# Patient Record
Sex: Female | Born: 1935 | Race: White | Hispanic: No | Marital: Single | Smoking: Former smoker
Health system: Southern US, Community
[De-identification: ages and names within clinical notes are randomized; demographics above are authoritative.]

## PROBLEM LIST (undated history)

## (undated) DIAGNOSIS — E079 Disorder of thyroid, unspecified: Secondary | ICD-10-CM

## (undated) DIAGNOSIS — I4891 Unspecified atrial fibrillation: Secondary | ICD-10-CM

## (undated) DIAGNOSIS — C50912 Malignant neoplasm of unspecified site of left female breast: Secondary | ICD-10-CM

## (undated) DIAGNOSIS — I10 Essential (primary) hypertension: Secondary | ICD-10-CM

## (undated) DIAGNOSIS — M359 Systemic involvement of connective tissue, unspecified: Secondary | ICD-10-CM

## (undated) DIAGNOSIS — C801 Malignant (primary) neoplasm, unspecified: Secondary | ICD-10-CM

## (undated) DIAGNOSIS — I341 Nonrheumatic mitral (valve) prolapse: Secondary | ICD-10-CM

## (undated) HISTORY — DX: Malignant (primary) neoplasm, unspecified: C80.1

## (undated) HISTORY — DX: Malignant neoplasm of unspecified site of left female breast: C50.912

## (undated) HISTORY — PX: OTHER SURGICAL HISTORY: SHX169

## (undated) HISTORY — DX: Disorder of thyroid, unspecified: E07.9

## (undated) HISTORY — PX: BREAST LUMPECTOMY: SHX2

## (undated) HISTORY — DX: Essential (primary) hypertension: I10

---

## 1999-01-17 ENCOUNTER — Encounter: Payer: Self-pay | Admitting: Plastic Surgery

## 1999-01-17 ENCOUNTER — Ambulatory Visit (HOSPITAL_COMMUNITY): Admission: RE | Admit: 1999-01-17 | Discharge: 1999-01-17 | Payer: Self-pay | Admitting: Plastic Surgery

## 1999-01-22 ENCOUNTER — Ambulatory Visit (HOSPITAL_COMMUNITY): Admission: RE | Admit: 1999-01-22 | Discharge: 1999-01-22 | Payer: Self-pay | Admitting: Plastic Surgery

## 1999-09-05 ENCOUNTER — Other Ambulatory Visit: Admission: RE | Admit: 1999-09-05 | Discharge: 1999-09-05 | Payer: Self-pay | Admitting: Obstetrics and Gynecology

## 2002-02-23 ENCOUNTER — Other Ambulatory Visit: Admission: RE | Admit: 2002-02-23 | Discharge: 2002-02-23 | Payer: Self-pay | Admitting: *Deleted

## 2002-07-27 ENCOUNTER — Encounter: Admission: RE | Admit: 2002-07-27 | Discharge: 2002-09-26 | Payer: Self-pay | Admitting: Surgery

## 2003-06-06 ENCOUNTER — Encounter: Admission: RE | Admit: 2003-06-06 | Discharge: 2003-07-04 | Payer: Self-pay | Admitting: Surgery

## 2003-07-07 ENCOUNTER — Encounter: Payer: Self-pay | Admitting: Internal Medicine

## 2003-07-07 ENCOUNTER — Encounter: Admission: RE | Admit: 2003-07-07 | Discharge: 2003-07-07 | Payer: Self-pay | Admitting: Internal Medicine

## 2006-01-19 ENCOUNTER — Encounter: Admission: RE | Admit: 2006-01-19 | Discharge: 2006-01-19 | Payer: Self-pay | Admitting: *Deleted

## 2006-01-21 ENCOUNTER — Encounter (INDEPENDENT_AMBULATORY_CARE_PROVIDER_SITE_OTHER): Payer: Self-pay | Admitting: *Deleted

## 2006-01-21 ENCOUNTER — Ambulatory Visit (HOSPITAL_BASED_OUTPATIENT_CLINIC_OR_DEPARTMENT_OTHER): Admission: RE | Admit: 2006-01-21 | Discharge: 2006-01-21 | Payer: Self-pay | Admitting: *Deleted

## 2006-01-26 ENCOUNTER — Ambulatory Visit: Payer: Self-pay | Admitting: Internal Medicine

## 2006-06-23 ENCOUNTER — Encounter: Admission: RE | Admit: 2006-06-23 | Discharge: 2006-08-18 | Payer: Self-pay | Admitting: Surgery

## 2007-10-13 ENCOUNTER — Telehealth (INDEPENDENT_AMBULATORY_CARE_PROVIDER_SITE_OTHER): Payer: Self-pay | Admitting: *Deleted

## 2008-01-06 ENCOUNTER — Telehealth (INDEPENDENT_AMBULATORY_CARE_PROVIDER_SITE_OTHER): Payer: Self-pay | Admitting: *Deleted

## 2008-03-02 ENCOUNTER — Encounter: Payer: Self-pay | Admitting: Internal Medicine

## 2008-03-06 ENCOUNTER — Telehealth (INDEPENDENT_AMBULATORY_CARE_PROVIDER_SITE_OTHER): Payer: Self-pay | Admitting: *Deleted

## 2008-03-07 ENCOUNTER — Encounter: Payer: Self-pay | Admitting: Internal Medicine

## 2008-03-07 ENCOUNTER — Encounter (INDEPENDENT_AMBULATORY_CARE_PROVIDER_SITE_OTHER): Payer: Self-pay | Admitting: *Deleted

## 2008-03-09 ENCOUNTER — Ambulatory Visit: Payer: Self-pay | Admitting: Internal Medicine

## 2008-03-09 DIAGNOSIS — E559 Vitamin D deficiency, unspecified: Secondary | ICD-10-CM

## 2008-03-09 DIAGNOSIS — M810 Age-related osteoporosis without current pathological fracture: Secondary | ICD-10-CM | POA: Insufficient documentation

## 2008-03-09 DIAGNOSIS — L255 Unspecified contact dermatitis due to plants, except food: Secondary | ICD-10-CM | POA: Insufficient documentation

## 2008-03-21 ENCOUNTER — Encounter (INDEPENDENT_AMBULATORY_CARE_PROVIDER_SITE_OTHER): Payer: Self-pay | Admitting: *Deleted

## 2008-04-21 ENCOUNTER — Encounter (INDEPENDENT_AMBULATORY_CARE_PROVIDER_SITE_OTHER): Payer: Self-pay | Admitting: *Deleted

## 2008-05-23 ENCOUNTER — Encounter: Admission: RE | Admit: 2008-05-23 | Discharge: 2008-08-11 | Payer: Self-pay | Admitting: Family Medicine

## 2008-07-07 ENCOUNTER — Ambulatory Visit: Payer: Self-pay | Admitting: Sports Medicine

## 2008-07-07 DIAGNOSIS — M67919 Unspecified disorder of synovium and tendon, unspecified shoulder: Secondary | ICD-10-CM | POA: Insufficient documentation

## 2008-07-07 DIAGNOSIS — M719 Bursopathy, unspecified: Secondary | ICD-10-CM

## 2008-07-07 DIAGNOSIS — M25519 Pain in unspecified shoulder: Secondary | ICD-10-CM

## 2008-07-10 ENCOUNTER — Encounter: Payer: Self-pay | Admitting: Sports Medicine

## 2008-10-05 ENCOUNTER — Ambulatory Visit: Payer: Self-pay | Admitting: Sports Medicine

## 2008-12-21 ENCOUNTER — Ambulatory Visit: Payer: Self-pay | Admitting: Sports Medicine

## 2008-12-21 DIAGNOSIS — M19049 Primary osteoarthritis, unspecified hand: Secondary | ICD-10-CM | POA: Insufficient documentation

## 2008-12-28 ENCOUNTER — Encounter: Admission: RE | Admit: 2008-12-28 | Discharge: 2009-01-18 | Payer: Self-pay | Admitting: Sports Medicine

## 2009-01-04 ENCOUNTER — Ambulatory Visit: Payer: Self-pay | Admitting: Sports Medicine

## 2009-01-04 DIAGNOSIS — M1710 Unilateral primary osteoarthritis, unspecified knee: Secondary | ICD-10-CM

## 2009-01-17 ENCOUNTER — Encounter: Payer: Self-pay | Admitting: Sports Medicine

## 2009-01-18 ENCOUNTER — Encounter: Payer: Self-pay | Admitting: Sports Medicine

## 2009-01-24 ENCOUNTER — Encounter: Payer: Self-pay | Admitting: Internal Medicine

## 2009-02-12 ENCOUNTER — Ambulatory Visit: Payer: Self-pay | Admitting: Sports Medicine

## 2009-02-12 DIAGNOSIS — G5750 Tarsal tunnel syndrome, unspecified lower limb: Secondary | ICD-10-CM | POA: Insufficient documentation

## 2009-03-14 ENCOUNTER — Encounter: Payer: Self-pay | Admitting: Internal Medicine

## 2009-03-16 ENCOUNTER — Ambulatory Visit: Payer: Self-pay | Admitting: Internal Medicine

## 2009-03-16 ENCOUNTER — Telehealth: Payer: Self-pay | Admitting: Family Medicine

## 2009-03-16 ENCOUNTER — Emergency Department (HOSPITAL_COMMUNITY): Admission: EM | Admit: 2009-03-16 | Discharge: 2009-03-16 | Payer: Self-pay | Admitting: Emergency Medicine

## 2009-03-17 ENCOUNTER — Observation Stay (HOSPITAL_COMMUNITY): Admission: EM | Admit: 2009-03-17 | Discharge: 2009-03-17 | Payer: Self-pay | Admitting: Emergency Medicine

## 2009-03-23 ENCOUNTER — Encounter: Payer: Self-pay | Admitting: Internal Medicine

## 2009-03-26 ENCOUNTER — Telehealth: Payer: Self-pay | Admitting: Family Medicine

## 2009-03-28 ENCOUNTER — Ambulatory Visit: Payer: Self-pay | Admitting: Internal Medicine

## 2009-03-28 LAB — CONVERTED CEMR LAB: Vit D, 25-Hydroxy: 36 ng/mL (ref 30–89)

## 2009-03-30 ENCOUNTER — Ambulatory Visit: Payer: Self-pay | Admitting: Internal Medicine

## 2009-03-30 DIAGNOSIS — S022XXA Fracture of nasal bones, initial encounter for closed fracture: Secondary | ICD-10-CM

## 2009-03-30 DIAGNOSIS — G450 Vertebro-basilar artery syndrome: Secondary | ICD-10-CM | POA: Insufficient documentation

## 2009-04-02 ENCOUNTER — Telehealth: Payer: Self-pay | Admitting: Internal Medicine

## 2009-04-04 ENCOUNTER — Ambulatory Visit: Payer: Self-pay | Admitting: Internal Medicine

## 2009-04-07 LAB — CONVERTED CEMR LAB: TSH: 0.3 microintl units/mL — ABNORMAL LOW (ref 0.35–5.50)

## 2009-04-10 ENCOUNTER — Encounter: Payer: Self-pay | Admitting: Internal Medicine

## 2009-04-11 ENCOUNTER — Encounter (INDEPENDENT_AMBULATORY_CARE_PROVIDER_SITE_OTHER): Payer: Self-pay | Admitting: *Deleted

## 2009-04-11 ENCOUNTER — Telehealth (INDEPENDENT_AMBULATORY_CARE_PROVIDER_SITE_OTHER): Payer: Self-pay | Admitting: *Deleted

## 2009-04-19 ENCOUNTER — Ambulatory Visit: Payer: Self-pay | Admitting: Sports Medicine

## 2009-05-29 ENCOUNTER — Ambulatory Visit: Payer: Self-pay | Admitting: Internal Medicine

## 2009-05-29 DIAGNOSIS — K5900 Constipation, unspecified: Secondary | ICD-10-CM | POA: Insufficient documentation

## 2009-06-05 ENCOUNTER — Encounter: Admission: RE | Admit: 2009-06-05 | Discharge: 2009-06-05 | Payer: Self-pay | Admitting: Gastroenterology

## 2009-06-05 ENCOUNTER — Ambulatory Visit: Payer: Self-pay | Admitting: Sports Medicine

## 2009-06-05 DIAGNOSIS — T07XXXA Unspecified multiple injuries, initial encounter: Secondary | ICD-10-CM

## 2009-06-05 DIAGNOSIS — M461 Sacroiliitis, not elsewhere classified: Secondary | ICD-10-CM

## 2009-07-09 ENCOUNTER — Ambulatory Visit: Payer: Self-pay | Admitting: Sports Medicine

## 2009-07-17 ENCOUNTER — Encounter: Admission: RE | Admit: 2009-07-17 | Discharge: 2009-09-12 | Payer: Self-pay | Admitting: Sports Medicine

## 2009-07-17 ENCOUNTER — Encounter: Payer: Self-pay | Admitting: Sports Medicine

## 2009-08-22 ENCOUNTER — Encounter: Payer: Self-pay | Admitting: Sports Medicine

## 2009-09-19 ENCOUNTER — Ambulatory Visit: Payer: Self-pay | Admitting: Sports Medicine

## 2009-09-26 ENCOUNTER — Encounter: Admission: RE | Admit: 2009-09-26 | Discharge: 2009-09-26 | Payer: Self-pay | Admitting: Gastroenterology

## 2009-11-21 ENCOUNTER — Encounter: Payer: Self-pay | Admitting: Sports Medicine

## 2009-12-20 ENCOUNTER — Ambulatory Visit: Payer: Self-pay | Admitting: Sports Medicine

## 2009-12-20 DIAGNOSIS — M479 Spondylosis, unspecified: Secondary | ICD-10-CM | POA: Insufficient documentation

## 2010-01-31 ENCOUNTER — Ambulatory Visit: Payer: Self-pay | Admitting: Sports Medicine

## 2010-04-09 ENCOUNTER — Ambulatory Visit: Payer: Self-pay | Admitting: Sports Medicine

## 2010-07-04 ENCOUNTER — Ambulatory Visit: Payer: Self-pay | Admitting: Sports Medicine

## 2010-10-24 NOTE — Consult Note (Signed)
Summary: Karie Fetch PT  [facility]   Imported By: Marily Memos 12/20/2009 08:37:28  _____________________________________________________________________  External Attachment:    Type:   Image     Comment:   External Document

## 2010-10-24 NOTE — Assessment & Plan Note (Signed)
Summary: F/U,MC   Vital Signs:  Patient profile:   77 year old female Height:      65 inches Weight:      137 pounds BMI:     22.88 BP sitting:   138 / 81  Vitals Entered By: Lillia Pauls CMA (Jan 31, 2010 11:12 AM)  Primary Provider:  Marga Melnick MD   History of Present Illness: [name] reports to f/u osteoporosis and bilateral knee DJD. Overall she is doing well.  She has returned to silver feet exercise program; a few 50-min sessions weekly; various exercises. Able to perform exercises without difficulty. Occasionally experiences increased B knee and quad pain after her sessions. She performs daily back rehab exercises as previously instructed.  Hx of chronic distal LE non-pitting edema. Recently began taking lisinopril-hctz for HTN. BP more controlled. Taking a [employer] creatine supplement without change in chronic lower extremity swelling. Gradually gaining weight as desired.     Allergies: No Known Drug Allergies  Physical Exam  General:  Well-developed,well-nourished,in no acute distress; alert,appropriate and cooperative throughout examination Msk:  Unchanged from LOV except for notably decreased difficulty arising from chair w/o use of arms.   No ttp of quads or knees.  Slight (0-5 deg) extension deficit wrt knees.   No knee swelling.   Slight diffuse non-pitting edema of the feet.  Diffuse varicosities throughout distal LEs.   Impression & Recommendations:  Problem # 1:  OSTEOPOROSIS (ICD-733.00)  - Continue current exercise regimen, particularly back stretches and wall walks as tolerated. - Continue creatine supplementation given no signs of adverse effects. - RTC as needed.  Her updated medication list for this problem includes:    Actonel 35 Mg Tabs (Risedronate sodium) .Marland Kitchen... Take one tablet weekly    Maximum D3 10000 Unit Caps (Cholecalciferol) .Marland Kitchen... 1 pill m, w & f  Problem # 2:  OSTEOARTHRITIS, SPINE, NOS (ICD-721.90)  - Continue current  daily exercises. - Add back exercises per handout provided to the patient. - RTC as needed.  Problem # 3:  OSTEOARTHRITIS, KNEES, BILATERAL (ICD-715.96)  - Continue current pair of custom orthotics. - Provided additional heel wedges for use in other pairs or shoes.   Her updated medication list for this problem includes:    Tizanidine Hcl 4 Mg Tabs (Tizanidine hcl) .Marland Kitchen... 1 by mouth at bedtime or three times a day as needed  Complete Medication List: 1)  Actonel 35 Mg Tabs (Risedronate sodium) .... Take one tablet weekly 2)  Armour Thyroid 120 Mg  .Marland Kitchen.. 1 once daily except 1/2 on weds 3)  Isocort  .Marland Kitchen.. 1 qd 4)  Vit D 3 1000u  .Marland Kitchen.. 1 qd 5)  Fish Oil Omega 3  .... 2 qd 6)  Serene  .Marland Kitchen.. 2 at bedtime 7)  Vitamin B-12 Cr 2000 Mcg Cr-tabs (Cyanocobalamin) .Marland Kitchen.. 1 by mouth once daily 8)  Alpha-lopid Acid 300mg   .... 1 by mouth once daily 9)  Chooz  .... Gum for indigestion 10)  Maximum D3 10000 Unit Caps (Cholecalciferol) .Marland Kitchen.. 1 pill m, w & f 11)  Alprazolam 0.5 Mg Tabs (Alprazolam) .Marland Kitchen.. 1 by mouth every 12 hours as needed 12)  Tizanidine Hcl 4 Mg Tabs (Tizanidine hcl) .Marland Kitchen.. 1 by mouth at bedtime or three times a day as needed 13)  Lisinopril-hydrochlorothiazide 20-12.5 Mg Tabs (Lisinopril-hydrochlorothiazide) .... Take one daily

## 2010-10-24 NOTE — Assessment & Plan Note (Signed)
Summary: FU VISIT/MJD   Vital Signs:  Patient profile:   77 year old female BP sitting:   159 / 79  Vitals Entered By: Lillia Pauls CMA (July 04, 2010 1:42 PM)  Primary Provider:  Marga Melnick MD   History of Present Illness: [name] is an 77 yo F with a history of osteoarthritis, osteoporosis and a rotator cuff tear. She comes in today for follow up.  Her main concern is biceps and shoulder pain on R. She feels it mostly when sleeping on L side with arm hanging accross her front. It is relieved by switching sides. She has been cautious in increasing her exercise on that arm since the rotator cuff tear. She is up to 3 lb weights for curls.  Knees still painful, especially in the morning and after activities. They improve throughout the day, unless she has done something strenuous. She is doing silver sneakers 3 times a week and tai chi but has stopped with the stationary bike and treadmill.  Back greatly improved with physical therapy. Current pain very manageable.  Allergies: No Known Drug Allergies  Physical Exam  General:  Well-developed,well-nourished,in no acute distress; alert,appropriate and cooperative throughout examination Msk:  Back: Mild kyphosis, but improved from last visit. Prominence of vertebrae in thoracic spine. Improved posture.  Arm/shoulder: R shoulder full ROM, equal to L. Some pain with crossing arm over front. No pain with palpation of AC joint, biceps tendon. Good strength with flexion, internal rotation, external rotation. Negative Neer's and Hawkins. Some pain with Speeds. Yerguson's without pain.  Knee:On standing, genuvalgus evident bilaterally. No pain to palpation along superior patella, joint lines, inferior patella. Good strength with flexion/extension at knee. 4/5 strength with hip flexion bilaterally.   Impression & Recommendations:  Problem # 1:  OSTEOARTHRITIS, KNEES, BILATERAL (ICD-715.96) Arthritis of knees with pain. She will  continue her exercise activities. Also advised to begin doing standing exercises for hip flexion to increase strength and maintain ability to rise from chair. Gave new sports insoles with tapered heel lifts and posting along medial edge. Her updated medication list for this problem includes:    Tizanidine Hcl 4 Mg Tabs (Tizanidine hcl) .Marland Kitchen... 1 by mouth at bedtime or three times a day as needed  Problem # 2:  OSTEOARTHRITIS, SPINE, NOS (ICD-721.90) Continue back therapy. Still has prominence of thoracic vertebra. Therapist advised on exercises to stretch and maintain posture. Advised on modified wall sit with pillow between shoulder blades, or lean with pillow at shoulder blades to increase postural strength.  Problem # 3:  SHOULDER PAIN, RIGHT (ICD-719.41) Pain likely secondary to stretching of rotator cuff in sleeping position. Patient should continue shoulder exercises and sleep with pillow between elbow and side to decrease pressure on shoulder.  Return in 3-4 months for f/u or as needed. Her updated medication list for this problem includes:    Tizanidine Hcl 4 Mg Tabs (Tizanidine hcl) .Marland Kitchen... 1 by mouth at bedtime or three times a day as needed  Complete Medication List: 1)  Actonel 35 Mg Tabs (Risedronate sodium) .... Take one tablet weekly 2)  Armour Thyroid 120 Mg  .Marland Kitchen.. 1 once daily except 1/2 on weds 3)  Isocort  .Marland Kitchen.. 1 qd 4)  Vit D 3 1000u  .Marland Kitchen.. 1 qd 5)  Fish Oil Omega 3  .... 2 qd 6)  Serene  .Marland Kitchen.. 2 at bedtime 7)  Vitamin B-12 Cr 2000 Mcg Cr-tabs (Cyanocobalamin) .Marland Kitchen.. 1 by mouth once daily 8)  Alpha-lopid Acid 300mg   .Marland KitchenMarland KitchenMarland Kitchen  1 by mouth once daily 9)  Chooz  .... Gum for indigestion 10)  Maximum D3 10000 Unit Caps (Cholecalciferol) .Marland Kitchen.. 1 pill m, w & f 11)  Alprazolam 0.5 Mg Tabs (Alprazolam) .Marland Kitchen.. 1 by mouth every 12 hours as needed 12)  Tizanidine Hcl 4 Mg Tabs (Tizanidine hcl) .Marland Kitchen.. 1 by mouth at bedtime or three times a day as needed 13)  Lisinopril-hydrochlorothiazide 20-12.5 Mg  Tabs (Lisinopril-hydrochlorothiazide) .... Take one daily  Appended Document: Orders Update    Clinical Lists Changes  Orders: Added new Service order of Est. Patient Level III (16109) - Signed

## 2010-10-24 NOTE — Assessment & Plan Note (Signed)
Summary: F/U,MC   Vital Signs:  Patient profile:   77 year old female Pulse rate:   80 / minute BP sitting:   147 / 76  (right arm)  Vitals Entered By: Terese Door (April 09, 2010 11:50 AM) CC: F/U Osteoporosis and Bilateral knee DJD   Primary Provider:  Marga Melnick MD  CC:  F/U Osteoporosis and Bilateral knee DJD.  History of Present Illness: 77 yo female here for f/u of back pain, knee pain, and right arm pain. 1.  Back pain: compression fx at T12, L1, chronic.  Hx of fall about 1 year ago.  Complains of pain in the middle of her back, especially when she lays on her back or leans on a hard surface.  No incontinence.  Still going to silver sneakers a few times a week.  Does not feel her function is back to baseline since fall.  2. Bilateral knee pain: chronic. 3. Right arm pain: hx of rotator cuff tear and biceps tendinitis (?).    she has clearly gained srength and feels better on Creatine  she has concerns about staying on bisphosphonates for her osteoporotic level BMD  Allergies: No Known Drug Allergies  Physical Exam  General:  Well-developed,well-nourished,in no acute distress; alert,appropriate and cooperative throughout examination Msk:  Back is improved w less kyphosis improved strength and posture  for rotator cuff she has essentially full elevation now and very little diff RT and LT  knees show bilat genu valgus Additional Exam:  Rewviewed 5 years of BMD data  lumbar spine has improved to < 2 on TX Hip total and fem neck - essentially no change  forearm somewhat worse T score   Impression & Recommendations:  Problem # 1:  OSTEOARTHRITIS, SPINE, NOS (ICD-721.90) new heel wedges in shoes for knees keep up exercise program but go every other day  Problem # 2:  OSTEOPOROSIS (ICD-733.00)  Her updated medication list for this problem includes:    Actonel 35 Mg Tabs (Risedronate sodium) .Marland Kitchen... Take one tablet weekly    Maximum D3 10000 Unit Caps  (Cholecalciferol) .Marland Kitchen... 1 pill m, w & f  This is stable and she had no fxs with bad falls last year not sure alendronate helps p multiple years and she could consider stopping  given diet and supplement advice to up calcium and vit D  Problem # 3:  ROTATOR CUFF SYNDROME, RIGHT (ICD-726.10) much improved needs to keeplt strength work  reck 3 mos  Complete Medication List: 1)  Actonel 35 Mg Tabs (Risedronate sodium) .... Take one tablet weekly 2)  Armour Thyroid 120 Mg  .Marland Kitchen.. 1 once daily except 1/2 on weds 3)  Isocort  .Marland Kitchen.. 1 qd 4)  Vit D 3 1000u  .Marland Kitchen.. 1 qd 5)  Fish Oil Omega 3  .... 2 qd 6)  Serene  .Marland Kitchen.. 2 at bedtime 7)  Vitamin B-12 Cr 2000 Mcg Cr-tabs (Cyanocobalamin) .Marland Kitchen.. 1 by mouth once daily 8)  Alpha-lopid Acid 300mg   .... 1 by mouth once daily 9)  Chooz  .... Gum for indigestion 10)  Maximum D3 10000 Unit Caps (Cholecalciferol) .Marland Kitchen.. 1 pill m, w & f 11)  Alprazolam 0.5 Mg Tabs (Alprazolam) .Marland Kitchen.. 1 by mouth every 12 hours as needed 12)  Tizanidine Hcl 4 Mg Tabs (Tizanidine hcl) .Marland Kitchen.. 1 by mouth at bedtime or three times a day as needed 13)  Lisinopril-hydrochlorothiazide 20-12.5 Mg Tabs (Lisinopril-hydrochlorothiazide) .... Take one daily

## 2010-10-24 NOTE — Assessment & Plan Note (Signed)
Summary: f/u,mc   Vital Signs:  Patient profile:   77 year old female BP sitting:   137 / 86  Vitals Entered By: Lillia Pauls CMA (December 20, 2009 11:10 AM)  Primary Provider:  Marga Melnick MD   History of Present Illness: Tried PT with [facility] and was not fully relieved w LBP  went to [facility] for [employer] tech this has helped her regain back motion and also improved her pain  lost 20 lbs from June to Sept during injuries and has not gained this back  Has had 2 GI evaluations but really did not improve x Tx for constipation Now using power pudding f joe graeddon and this is helping and relieving pain and lessening constipation Has a lot of pain w past colonoscopy and so has not wanted to do this again hx of LLQ painAbd CT showed Colitis  Would like to regain weight  wants to work on strength - now working with 1 lb and up to 3 lbs on few exercises  Allergies: No Known Drug Allergies  Physical Exam  General:  Well-developed,well-nourished,in no acute distress; alert,appropriate and cooperative throughout examination Msk:  Increased level of kyphosis in lumbar and thoracic spine  able to elevate arms above head to full ext with wall walk  able to do extension and this relieves back  still weak on hip flex and getting out of chair  better arm strength  better balance   Impression & Recommendations:  Problem # 1:  OSTEOPOROSIS (ICD-733.00)  Her updated medication list for this problem includes:    Actonel 35 Mg Tabs (Risedronate sodium) .Marland Kitchen... Take one tablet weekly    Maximum D3 10000 Unit Caps (Cholecalciferol) .Marland Kitchen... 1 pill m, w & f  I think this is worsening some with more compression and kyphosis in spine will gradually reintroduce more strength work  will let her try some creatine to see if she gets a strength boost as diet deficient in red meats  Problem # 2:  OSTEOARTHRITIS, SPINE, NOS (ICD-721.90) prob elements of DJD also changes of  compression fxs contribute  this makes it diffcult to sit because of prominence of cert vert bodies in back  wiull need to cushion this  Problem # 3:  CONSTIPATION (ICD-564.00) This is improving with food combo to improve stools  cont at least 8 glasses of water  reck in 8 wks  Complete Medication List: 1)  Actonel 35 Mg Tabs (Risedronate sodium) .... Take one tablet weekly 2)  Armour Thyroid 120 Mg  .Marland Kitchen.. 1 once daily except 1/2 on weds 3)  Isocort  .Marland Kitchen.. 1 qd 4)  Vit D 3 1000u  .Marland Kitchen.. 1 qd 5)  Fish Oil Omega 3  .... 2 qd 6)  Serene  .Marland Kitchen.. 2 at bedtime 7)  Vitamin B-12 Cr 2000 Mcg Cr-tabs (Cyanocobalamin) .Marland Kitchen.. 1 by mouth once daily 8)  Alpha-lopid Acid 300mg   .... 1 by mouth once daily 9)  Chooz  .... Gum for indigestion 10)  Maximum D3 10000 Unit Caps (Cholecalciferol) .Marland Kitchen.. 1 pill m, w & f 11)  Alprazolam 0.5 Mg Tabs (Alprazolam) .Marland Kitchen.. 1 by mouth every 12 hours as needed 12)  Tizanidine Hcl 4 Mg Tabs (Tizanidine hcl) .Marland Kitchen.. 1 by mouth at bedtime or three times a day as needed 13)  Lisinopril-hydrochlorothiazide 20-12.5 Mg Tabs (Lisinopril-hydrochlorothiazide) .... Take one daily  Patient Instructions: 1)  Mckenzie exxtension exercises keep up for Low back 2)  upward wall stretches 3)  work external rotation of shoulders with very light weigth 4)  add a couple others that you find good 5)  Creatine 5 grams daily with lots of water 6)  recheck in 6 weeks

## 2010-11-05 ENCOUNTER — Ambulatory Visit: Payer: Self-pay | Admitting: Sports Medicine

## 2010-11-12 ENCOUNTER — Ambulatory Visit (INDEPENDENT_AMBULATORY_CARE_PROVIDER_SITE_OTHER): Payer: Medicare Other | Admitting: Sports Medicine

## 2010-11-12 ENCOUNTER — Encounter: Payer: Self-pay | Admitting: Sports Medicine

## 2010-11-12 DIAGNOSIS — M719 Bursopathy, unspecified: Secondary | ICD-10-CM

## 2010-11-12 DIAGNOSIS — M1710 Unilateral primary osteoarthritis, unspecified knee: Secondary | ICD-10-CM

## 2010-11-12 DIAGNOSIS — M479 Spondylosis, unspecified: Secondary | ICD-10-CM

## 2010-11-19 NOTE — Assessment & Plan Note (Signed)
Summary: F/U,MC   Vital Signs:  Patient profile:   77 year old female Pulse rate:   97 / minute BP sitting:   164 / 91  (right arm)  Vitals Entered By: Jeannie Done CC: f/u knees and back   Primary Provider:  Marga Melnick MD  CC:  f/u knees and back.  History of Present Illness: Pt here to f/u:  Knee pain - 20% improved feels she has gained strength w biking onlu up to 15 mins day  LBP the specific stretches and exercise per PT have helped continues walking program w PT using cushion to sit  RT shoulder feels that she gained a lot of strength except w full overhead activity on RT some pain on biceps flexion if too heavy does exercises w 3 lbs OK but 5 lbs hurt  Sit and stand Doing silver sneakers has done really well w this now able to do standing knee to shoulder lifts and easily get from chair  Preventive Screening-Counseling & Management  Alcohol-Tobacco     Smoking Status: quit  Allergies: No Known Drug Allergies  Social History: Smoking Status:  quit  Physical Exam  General:  Well-developed,well-nourished,in no acute distress; alert,appropriate and cooperative throughout examination Msk:  RT shoulder pain and mils weakness on Speeds yergason's (supination) is stronger good IR and ER strength good abduction strength up to 90 deg still weak on empty can and above 120 deg of elevation on RT lack 10 deg full elevation  Back is stable to movement w better posture still prominent spinous process at T 10 level post  Knees show mild chronic DJD changes no effusion good strength for quads lacs 5 deg full extensino on l;eft and 3 deg on RT   Impression & Recommendations:  Problem # 1:  SHOULDER PAIN, RIGHT (ICD-719.41)  The following medications were removed from the medication list:    Tizanidine Hcl 4 Mg Tabs (Tizanidine hcl) .Marland Kitchen... 1 by mouth at bedtime or three times a day as needed  she has done very well w this cont to work rehab  protocol  cont to try to do wall climbs to gain last 10 deg of elevation  Problem # 2:  OSTEOARTHRITIS, SPINE, NOS (ICD-721.90) stable  no change in stretches  Problem # 3:  OSTEOARTHRITIS, KNEES, BILATERAL (ICD-715.96)  The following medications were removed from the medication list:    Tizanidine Hcl 4 Mg Tabs (Tizanidine hcl) .Marland Kitchen... 1 by mouth at bedtime or three times a day as needed  quad strength program working slowly cont to build up  reck this in 4 mos or so  Problem # 4:  Preventive Health Care (ICD-V70.0) overall active and doing well  Dr Asencion Islam sees her for a number of alternative treatments cont this and nutrition support  I would encourage cont exercise program and silver sneakers  Complete Medication List: 1)  Armour Thyroid 120 Mg  .Marland Kitchen.. 1 once daily except 1/2 on weds 2)  Isocort  .Marland Kitchen.. 1 qd 3)  Vit D 3 1000u  .Marland Kitchen.. 1 qd 4)  Fish Oil Omega 3  .... 2 qd 5)  Serene  .Marland Kitchen.. 2 at bedtime 6)  Vitamin B-12 Cr 2000 Mcg Cr-tabs (Cyanocobalamin) .Marland Kitchen.. 1 by mouth once daily 7)  Alpha-lopid Acid 300mg   .... 1 by mouth once daily 8)  Chooz  .... Gum for indigestion 9)  Maximum D3 10000 Unit Caps (Cholecalciferol) .Marland Kitchen.. 1 pill m, w & f 10)  Alprazolam 0.5 Mg Tabs (Alprazolam) .Marland KitchenMarland KitchenMarland Kitchen  1 by mouth every 12 hours as needed 11)  Lisinopril-hydrochlorothiazide 20-12.5 Mg Tabs (Lisinopril-hydrochlorothiazide) .... Take one daily   Orders Added: 1)  Est. Patient Level IV [40981]

## 2010-12-30 LAB — COMPREHENSIVE METABOLIC PANEL
ALT: 16 U/L (ref 0–35)
AST: 18 U/L (ref 0–37)
Albumin: 3.5 g/dL (ref 3.5–5.2)
Alkaline Phosphatase: 43 U/L (ref 39–117)
BUN: 9 mg/dL (ref 6–23)
Chloride: 104 mEq/L (ref 96–112)
GFR calc Af Amer: >60 mL/min (ref >60)
Potassium: 3.7 mEq/L (ref 3.5–5.1)
Sodium: 138 mEq/L (ref 135–145)
Total Bilirubin: 0.8 mg/dL (ref 0.3–1.2)
Total Protein: 6.7 g/dL (ref 6.0–8.3)

## 2010-12-30 LAB — CBC
HCT: 37.3 % (ref 36.0–46.0)
Platelets: 163 10*3/uL (ref 150–400)
RDW: 13.1 % (ref 11.5–15.5)
WBC: 5.9 10*3/uL (ref 4.0–10.5)

## 2010-12-30 LAB — PROTIME-INR: INR: 1.1 (ref 0.00–1.49)

## 2011-02-04 NOTE — Discharge Summary (Signed)
[name], [name] NO.:  [id number]   MEDICAL RECORD NO.:  [id number]          PATIENT TYPE:  OBV   LOCATION:  5120                         FACILITY:  [facility]   PHYSICIAN:  Valerie A. Felicity Coyer, MDDATE OF BIRTH:  1927-08-07   DATE OF ADMISSION:  03/16/2009  DATE OF DISCHARGE:  03/17/2009                               DISCHARGE SUMMARY   DISCHARGE DIAGNOSES:  1. Severe epistaxis, status post facial trauma, status post anterior      packing 6:25 p.m. to remain in for minimum 72 hours with outpatient      followup with Dr. Jenne Pane, ENT, see details below.  2. Facial fracture secondary to trauma including right nasal maxilla      and anterior plus lateral walls of right maxillary sinus.  Continue      pain control with Vicodin or Ultram as needed.  3. Osteoporosis history.  4. Osteoarthritis.  5. Hypothyroidism.  6. History of breast cancer, remote.   DISCHARGE MEDICATIONS:  Vicodin 5/500 one to two p.o. q.6 h. p.r.n.  moderate-to-severe pain.   Other medications are as prior to admission and include;  1. Augmentin 875 p.o. b.i.d. to continue for 7 days begun yesterday.  2. Ultram 50 mg p.o. q.4 h. p.r.n. moderate pain.  3. Zofran 4 mg p.o. b.i.d. p.r.n. nausea.  4. Vitamin D 2000 units daily.  5. Fish oil 1000 mg once daily.  6. Armour Thyroid 120 mg p.o. daily.  7. Actonel once weekly 70 mg.   DISPOSITION:  The patient is discharged home in medically stable and  improved condition.  Hospital follow up will be at [facility] ENT with  Dr. Jenne Pane.  The patient to call Monday June 28 for appointment  clarification.  The patient has been instructed that packing should  remain in place until follow up with ENT minimum of 72 hours and not to  remove prior to that time per instruction from Dr. Jenne Pane.   CONDITION ON DISCHARGE:  Medically improved and stable.  No further  bleeding since second ER evaluation on June 25.   HOSPITAL COURSE:  Severe epistaxis with facial  fracture due to trauma.  The patient is an 77 year old woman admitted for observation from the  emergency room due to continued nasal bleeding.  She had been seen in  the [facility] Emergency Room prior on the day of June 25 following a  fall into a brick wall with subsequent severe epistaxis bleeding and  evidence of right-sided facial fractures due to the trauma.  It seemed  the bleeding improved and she was initially discharged home with  prescription for Augmentin, Ultram p.r.n., and Zofran p.r.n.  However,  once at home with any upright position, the patient would have recurrent  severe bleeding, which prompted return to the emergency room for  packing.  A nasal anterior packing was placed into the right naris with  subsequent control of bleeding, which she was admitted for observation  due to same.  There has been no further evidence of bleeding, but there  was question on the patient's behalf as to timing  for packing removal.  I have discussed and clarified this with Dr. Jenne Pane, ENT on-call, who is  aware of the patient from previous discussion in the emergency room.  He  recommends packing remain in place for a minimum of 72 hours and not to  be removed prior to that time.  This information has been relayed to the  patient.  We will contact [facility] ENT on Monday June 28 for further  clarification of followup with Dr. Jenne Pane.  Her pain has been controlled  with addition of Vicodin during this hospitalization and she will be  discharged with a prescription of 20 tablets of same for treatment of  her pain in addition to the Ultram p.r.n.  All other medications and  medical problems are as prior to admission without change.      Valerie A. Felicity Coyer, MD  Electronically Signed     VAL/MEDQ  D:  03/17/2009  T:  03/17/2009  Job:  [id number]
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
## 2011-02-07 NOTE — Op Note (Signed)
NAMETILIA, FASO NO.:  [id number]   MEDICAL RECORD NO.:  [id number]          PATIENT TYPE:  AMB   LOCATION:  NESC                         [facility]ACILITY[facility]   PHYSICIAN:  Alfonse Ras, MD   DATE OF BIRTH:  08-09-1927   DATE OF PROCEDURE:  01/21/2006  DATE OF DISCHARGE:                                 OPERATIVE REPORT   PREOPERATIVE DIAGNOSIS:  Grade 3 internal hemorrhoids with prolapse.   POSTOPERATIVE DIAGNOSIS:  Grade 3 internal hemorrhoids with prolapse.   PROCEDURE:  PP hemorrhoidectomy with rectopexy.   SURGEON:  Alfonse Ras, MD.   ANESTHESIA:  General.   DESCRIPTION OF PROCEDURE:  The patient was taken to the operating room and  placed in a supine position.  After adequate anesthesia was induced using an  endotracheal tube, the patient was placed in a prone jackknife position.  Rectal and perianal prep were undertaken.  The patient was prepped and  draped in the normal sterile fashion.  The anal dilatation was accomplished  to three fingerbreadths.  The internal hemorrhoidal bundles were injected  with 0.25 Marcaine with Wydase.  The internal and external sphincter muscles  were injected with an additional 50 mL of 0.25 Marcaine.  A 2-0 Prolene  mucosal and submucosal pursestring suture was placed, approximately 5-6 cm  proximal to the dentate line.  Of note, there was a significant amount of  redundant mucosa.  A small anal polyp was excised.  The stapler was then  placed within the rectum and the pursestring suture was tied down around the  anvil.  The stapler was closed and held in place for 45 seconds and fired.  It was removed.  There was a good 2 cm ring of mucosa.  Of note, the vagina  had previously been checked prior to firing the stapler.  The staple line  was inspected.  There was no bleeding.  Gelfoam packing was placed.  The  patient tolerated the procedure well and went to the PACU in good condition.      Alfonse Ras,  MD  Electronically Signed     KRE/MEDQ  D:  01/21/2006  T:  01/21/2006  Job:  [id number]

## 2011-04-15 ENCOUNTER — Ambulatory Visit (INDEPENDENT_AMBULATORY_CARE_PROVIDER_SITE_OTHER): Payer: Medicare Other | Admitting: Sports Medicine

## 2011-04-15 VITALS — BP 158/88 | HR 82

## 2011-04-15 DIAGNOSIS — R5381 Other malaise: Secondary | ICD-10-CM

## 2011-04-15 DIAGNOSIS — M1710 Unilateral primary osteoarthritis, unspecified knee: Secondary | ICD-10-CM

## 2011-04-15 DIAGNOSIS — M67919 Unspecified disorder of synovium and tendon, unspecified shoulder: Secondary | ICD-10-CM

## 2011-04-15 DIAGNOSIS — R531 Weakness: Secondary | ICD-10-CM | POA: Insufficient documentation

## 2011-04-15 DIAGNOSIS — M719 Bursopathy, unspecified: Secondary | ICD-10-CM

## 2011-04-15 NOTE — Assessment & Plan Note (Signed)
I think she is on a good program with the Silver sneakers and should continue this  We will try to give her exercises to supplement on other days.

## 2011-04-15 NOTE — Assessment & Plan Note (Signed)
She has made reasonable progress with her knees. I think her balance will be better if she improves her strength primarily at her hips and some at her quadriceps  She was given a series of exercises to help with this please see instructions

## 2011-04-15 NOTE — Patient Instructions (Addendum)
Deep breathing exercises:  Take deep breath in to expand your rib cage then let out slowly, repeat this a few times.   Hold 1 pound weights out to the side and take a deep breath in, then let the breath out slowly and bring the weights together towards the center  Do side leg lifts while standing.  Do step up exercises to the front and side   Return for follow up in 2 months

## 2011-04-15 NOTE — Assessment & Plan Note (Signed)
She has made good progress both with the strength of the right shoulder and with her motion She lacks some full extension and elevation in both forward flexion and lateral abduction  She can do these motions if she climbs the wall  I want her to maintain some of her shoulder strengthening and add wall climbing exercises

## 2011-04-15 NOTE — Progress Notes (Signed)
  Subjective:    Patient ID: [name], female    DOB: August 26, 1927, 77 y.o.   MRN: [id number]  HPI  Pt presents to clinic for f/u of bilat knee pain and rt shoulder pain. States her rt shoulder pain has resolved, but occasionally still has pain in biceps. Bilat knee pain which has improved about 60% since last visit.  States she is concerned that  her knees feel weak, and her balance is deteriorating.  Goes to [employer] 3 times per week.  States they do resistance training for entire body.   Review of Systems     Objective:   Physical Exam     Good quad definition bilat Good hip flexor strength bilat Hip abduction weak bilat Unable to do step up on 8 in step without asst Unable to do lateral step up on 2 inch book without unsteadiness  Walking gait normal- does get into slight genu valgum   Assessment & Plan:

## 2011-07-08 ENCOUNTER — Ambulatory Visit (INDEPENDENT_AMBULATORY_CARE_PROVIDER_SITE_OTHER): Payer: Medicare Other | Admitting: Sports Medicine

## 2011-07-08 DIAGNOSIS — T07XXXA Unspecified multiple injuries, initial encounter: Secondary | ICD-10-CM

## 2011-07-08 DIAGNOSIS — K5900 Constipation, unspecified: Secondary | ICD-10-CM

## 2011-07-08 MED ORDER — HYDROCODONE-ACETAMINOPHEN 5-500 MG PO TABS: 1.0000 | ORAL_TABLET | Freq: Four times a day (QID) | ORAL | Status: DC | PRN

## 2011-07-08 NOTE — Progress Notes (Signed)
  Subjective:    Patient ID: [name], female    DOB: 05/15/1927, 77 y.o.   MRN: [id number]  HPI This patient had a fall 5 days ago. She fell backwards onto her buttocks with more pain directed toward the left SI joint area. She also felt some pain along the right buttocks. Otherwise she had moderate pain in her neck and shoulders. She was checked at urgent care Center by Dr. Hal Hope 2 days ago and had a series of x-rays. By report none of these shows fractures. She has a history of significant osteoporosis and compression fractures from falling in the past.  Currently she is doing very well and actually takes Lovasa and Armour Thyroid. She sees Dr. Asencion Islam for alternative medicine and diet treatments.  Challenge for her is chronic constipation that started in childhood.   Review of Systems     Objective:   Physical Exam No acute distress alert and oriented On palpation of the spine there is no area of discrete tenderness Palpation of the iliac crests in the SI joint area revealed very mild tenderness on the left only There is some muscular tenderness over the left buttocks Hip rotation appears equal bilaterally and is normal Walking gait shows that she can walk slowly without significant limp although she is hesitant to push off the left side  She does have trouble doing hip flexion on the left and trying to get out of the chair secondary to pain       Assessment & Plan:

## 2011-07-08 NOTE — Patient Instructions (Addendum)
Home Exercises for hip and low back: Walk carefully and regularly Swing leg at hip while holding onto wall for stabilization For pain continue 2 extra strength tylenol Vicodin 1 tab every 6 hours as needed for severe pain  Bowel regimen: Continue to drink plenty of water Prunes about twice daily while drinking water Apples, grapes Mag citrate if needed

## 2011-07-08 NOTE — Assessment & Plan Note (Signed)
She has a lot of soft tissue pain over the buttocks and low back area but I do not think she has any fractures We are awaiting x-ray results from urgent care to be sure nothing was seen  Regardless of the x-rays I do think she would benefit from an easy walking program in easy motion for her left leg  If she is doing better we will send her to PT in 2 weeks if not I will see her in 4 weeks. If she goes to PT I will see her back here in 6 weeks.  Meds Vicodin for more severe pain but she tries to say on extra strength Tylenol.

## 2011-07-08 NOTE — Assessment & Plan Note (Signed)
C. instructions sheet as we encouraged her to use more high-fiber fruits and lots of water to avoid  constipation while we put her on pain medications for her fall.

## 2011-08-05 ENCOUNTER — Ambulatory Visit (INDEPENDENT_AMBULATORY_CARE_PROVIDER_SITE_OTHER): Payer: Medicare Other | Admitting: Sports Medicine

## 2011-08-05 DIAGNOSIS — M545 Low back pain: Secondary | ICD-10-CM

## 2011-08-05 DIAGNOSIS — M479 Spondylosis, unspecified: Secondary | ICD-10-CM

## 2011-08-05 NOTE — Assessment & Plan Note (Signed)
While her recent fall worsened her symptoms of arthritis in her back when I reviewed her x-rays I did not see any sign of new compression fractures. She does have the chronic compression fractures that are thought related to her osteoporosis  I think she should continue with as much activity as tolerated  The emphasis on her therapy is on both balance and strength for standing and getting up from a sitting position  Recheck in 2 months

## 2011-08-05 NOTE — Progress Notes (Signed)
  Subjective:    Patient ID: [name], female    DOB: Sep 29, 1926, 77 y.o.   MRN: [id number]  HPI  *FOLLOWUP BACK CONTUSIONS/LOW BACK PAIN - Fall in October - At that time she had fallen backwards onto her buttocks with more pain directed toward the left SI joint area. She also felt some pain along the right buttocks.  - Xrays were negative for fracture - Was using vicodin for pain but this made her constipated so she only used it for about 5 days - Not on any pain medications currently  - Using hot thermal compresses to help with pain - No shooting pains - No pain/weakness into the extremities - Pain varies from day to day but overall much improved since last visit  Review of Systems No fevers/chills/wt loss No nausea    Objective:   Physical Exam  GEN: NAD  MSK:  Back   Inspection Proper back alignment with no obvious deformity  Palpation: No ttp along LSpine + ttp over bilateral SI joints  ROM Full hip flexion Has lost about 15 degrees of internal/external rotation  Strength: 5/5 hip flexion and abduction  Special Tests Neg SLR No pain with facet loading      Assessment & Plan:   LOW BACK PAIN - Goal is to keep her as active as possible -- cont stationary bike and treadmill activities - Continue balance exercises with PT with particular attention to core musculature - Step up exercises on short steps - Tylenol prn - RTC prn

## 2011-08-05 NOTE — Patient Instructions (Addendum)
  Can do balance techniques standing and holding on to doorknob and looking over shoulder with EYES OPEN  Stand on side of wall on one foot and lift foot in front of you beside you and behind you  Keep up leg swings  Step up exercises on small step  Stand up from bed exercise  One aerobic activity as much as possible  Stay as active as possible

## 2011-09-11 ENCOUNTER — Ambulatory Visit (INDEPENDENT_AMBULATORY_CARE_PROVIDER_SITE_OTHER): Payer: Medicare Other | Admitting: Sports Medicine

## 2011-09-11 ENCOUNTER — Encounter: Payer: Self-pay | Admitting: Sports Medicine

## 2011-09-11 VITALS — BP 118/84 | HR 81 | Ht 66.0 in | Wt 123.0 lb

## 2011-09-11 DIAGNOSIS — IMO0002 Reserved for concepts with insufficient information to code with codable children: Secondary | ICD-10-CM

## 2011-09-11 DIAGNOSIS — M545 Low back pain, unspecified: Secondary | ICD-10-CM

## 2011-09-11 DIAGNOSIS — M1710 Unilateral primary osteoarthritis, unspecified knee: Secondary | ICD-10-CM

## 2011-09-11 NOTE — Patient Instructions (Signed)
Continue PT, add hip abductor and quad strengthening. PT order faxed to [facility] lift in both shoes. Quad exercise hand out given F/U in 2 month

## 2011-09-11 NOTE — Progress Notes (Signed)
  Subjective:    Patient ID: [name], female    DOB: [birth year], 77 y.o.   MRN: [id number]  HPI  She is 80-90% better. Has been doing Pt for 2 month . We have a report from PT [facility]) recommending to continue balance rehab. She states that she has mild back pain on and off. She is not taking any medication for pain. She denies any radicular symptoms, no weakness, no saddle anaesthesia, no urinary or fecal incontinence.    Review of Systems  Constitutional: Negative for chills, diaphoresis, appetite change and fatigue.  Musculoskeletal: Negative for back pain, joint swelling, arthralgias and gait problem.  Neurological: Negative for weakness and numbness.       Objective:   Physical Exam  Constitutional: She is oriented to person, place, and time. She appears well-developed and well-nourished.       BP 118/84  Pulse 81  Ht 5\' 6"  (1.676 m)  Wt 123 lb (55.792 kg)  BMI 19.85 kg/m2   Pulmonary/Chest: Effort normal.  Musculoskeletal:       Low back with intact skin. No swelling, no hematomas.No TTP in the low back FROM for flexion, extension, rotation and lateralization.  No enderness to palpation on SI joint area B/L. Faber test negative for SI joint pain. Straight leg raise negative B/L. Strength 4/5 for hip flexion and extension, 5/5 for knee flexion and extension, 5/5 for ankle plantar and dorsal flexion B/L. DTR patellar and achilles II/IV B/L Sensation intact distally B/L. No leg discrepancy.   Neurological: She is alert and oriented to person, place, and time.  Skin: Skin is warm. No rash noted. No erythema. No pallor.  Psychiatric: She has a normal mood and affect. Her behavior is normal.          Assessment & Plan:   1. Low back pain   2. OSTEOARTHRITIS, KNEES, BILATERAL    Continue PT, add hip abductor and quad strengthening. PT order faxed to [facility] lift in both shoes. Quad exercise hand out given F/U in 2 month

## 2011-11-12 ENCOUNTER — Ambulatory Visit (INDEPENDENT_AMBULATORY_CARE_PROVIDER_SITE_OTHER): Payer: Medicare Other | Admitting: Sports Medicine

## 2011-11-12 VITALS — BP 156/90

## 2011-11-12 DIAGNOSIS — M25519 Pain in unspecified shoulder: Secondary | ICD-10-CM
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
## 2011-11-12 NOTE — Patient Instructions (Addendum)
Please continue working with [name] in physical therapy for at least 6-8 more sessions, or until he thinks you are ready to be discharged  You have strained your upper back and neck primarily on the rt side   Please follow up after your finish physical therapy  Thank you for seeing [place] today!

## 2011-11-13 ENCOUNTER — Encounter: Payer: Self-pay | Admitting: Sports Medicine

## 2011-11-20 ENCOUNTER — Ambulatory Visit: Payer: Medicare Other

## 2011-11-20 ENCOUNTER — Ambulatory Visit (INDEPENDENT_AMBULATORY_CARE_PROVIDER_SITE_OTHER): Payer: Medicare Other | Admitting: Family Medicine

## 2011-11-20 VITALS — BP 150/90 | HR 91 | Temp 97.4°F | Resp 20 | Ht 65.5 in | Wt 123.4 lb

## 2011-11-20 DIAGNOSIS — M542 Cervicalgia: Secondary | ICD-10-CM

## 2011-11-20 DIAGNOSIS — S0093XA Contusion of unspecified part of head, initial encounter: Secondary | ICD-10-CM

## 2011-11-20 DIAGNOSIS — S0003XA Contusion of scalp, initial encounter: Secondary | ICD-10-CM

## 2011-11-20 DIAGNOSIS — S1093XA Contusion of unspecified part of neck, initial encounter: Secondary | ICD-10-CM

## 2011-11-20 DIAGNOSIS — R519 Headache, unspecified: Secondary | ICD-10-CM

## 2011-11-20 DIAGNOSIS — R51 Headache: Secondary | ICD-10-CM
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
## 2011-12-05 ENCOUNTER — Ambulatory Visit (INDEPENDENT_AMBULATORY_CARE_PROVIDER_SITE_OTHER): Payer: Medicare Other | Admitting: Family Medicine

## 2011-12-05 VITALS — BP 160/89 | HR 109 | Temp 98.5°F | Resp 16 | Ht 66.0 in | Wt 125.0 lb

## 2011-12-05 DIAGNOSIS — N390 Urinary tract infection, site not specified: Secondary | ICD-10-CM

## 2011-12-05 DIAGNOSIS — R3915 Urgency of urination: Secondary | ICD-10-CM

## 2011-12-05 DIAGNOSIS — K648 Other hemorrhoids: Secondary | ICD-10-CM

## 2011-12-05 DIAGNOSIS — K649 Unspecified hemorrhoids: Secondary | ICD-10-CM

## 2011-12-05 LAB — POCT UA - MICROSCOPIC ONLY
Casts, Ur, LPF, POC: NEGATIVE
Crystals, Ur, HPF, POC: NEGATIVE
Mucus, UA: NEGATIVE
Yeast, UA: NEGATIVE

## 2011-12-05 LAB — POCT URINALYSIS DIPSTICK
Bilirubin, UA: NEGATIVE
Blood, UA: NEGATIVE
Glucose, UA: NEGATIVE
Nitrite, UA: NEGATIVE
Protein, UA: 30
Spec Grav, UA: 1.015
Urobilinogen, UA: 0.2
pH, UA: 7.5

## 2011-12-05 MED ORDER — HYDROCORTISONE 2.5 % RE CREA: TOPICAL_CREAM | Freq: Two times a day (BID) | RECTAL | Status: DC

## 2011-12-05 MED ORDER — CIPROFLOXACIN HCL 250 MG PO TABS: 250.0000 mg | ORAL_TABLET | Freq: Two times a day (BID) | ORAL | Status: DC

## 2011-12-05 NOTE — Patient Instructions (Signed)
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
## 2011-12-16 ENCOUNTER — Emergency Department (HOSPITAL_COMMUNITY)
Admission: EM | Admit: 2011-12-16 | Discharge: 2011-12-17 | Disposition: A | Discharge status: Home / Self Care | Payer: Medicare Other | Attending: Emergency Medicine | Admitting: Emergency Medicine

## 2011-12-16 ENCOUNTER — Encounter (HOSPITAL_COMMUNITY): Payer: Self-pay | Admitting: Emergency Medicine

## 2011-12-16 DIAGNOSIS — R110 Nausea: Secondary | ICD-10-CM | POA: Insufficient documentation

## 2011-12-16 DIAGNOSIS — R42 Dizziness and giddiness: Secondary | ICD-10-CM | POA: Insufficient documentation

## 2011-12-16 DIAGNOSIS — R51 Headache: Secondary | ICD-10-CM | POA: Insufficient documentation

## 2011-12-16 DIAGNOSIS — N390 Urinary tract infection, site not specified: Secondary | ICD-10-CM

## 2011-12-16 DIAGNOSIS — Z853 Personal history of malignant neoplasm of breast: Secondary | ICD-10-CM | POA: Insufficient documentation

## 2011-12-16 LAB — POCT I-STAT, CHEM 8
BUN: 11 mg/dL (ref 6–23)
Calcium, Ion: 1.12 mmol/L (ref 1.12–1.32)
Creatinine, Ser: 0.7 mg/dL (ref 0.50–1.10)
Hemoglobin: 12.9 g/dL (ref 12.0–15.0)
Sodium: 133 mEq/L — ABNORMAL LOW (ref 135–145)
TCO2: 25 mmol/L (ref 0–100)

## 2011-12-16 NOTE — ED Notes (Signed)
Pt states she had physical therapy today, had a busy day and this evening decided she needed to eat so she was making herself supper and began to feel light headed and "woozy"  Pt states she sat down for a bit and developed a headache which has gone away now but she also has a queezy feeling in the pit of her stomach she cannot get rid of   Pt states she lives by herself and was afraid to stay home alone  Here for evaluation  Pt states she was seen by her GI dr yesterday and everything checked out ok

## 2011-12-16 NOTE — ED Notes (Signed)
Pt sts last few months have been very stressful. Increased problems with ABD pain. Today C/O epigastric pain, headache, sinus pain, chronic back pain, neck pain.

## 2011-12-17 ENCOUNTER — Other Ambulatory Visit: Payer: Self-pay

## 2011-12-17 LAB — URINALYSIS, ROUTINE W REFLEX MICROSCOPIC
Glucose, UA: NEGATIVE mg/dL
Protein, ur: NEGATIVE mg/dL
Urobilinogen, UA: 1 mg/dL (ref 0.0–1.0)

## 2011-12-17 LAB — URINE MICROSCOPIC-ADD ON

## 2011-12-17 MED ORDER — SULFAMETHOXAZOLE-TRIMETHOPRIM 800-160 MG PO TABS: 1.0000 | ORAL_TABLET | Freq: Two times a day (BID) | ORAL | Status: AC
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
## 2011-12-17 NOTE — ED Notes (Signed)
78-year-old female who presents with nausea, epigastric pain, lightheadedness and a mild headache which is all spontaneously resolved after she belched on arrival.  Physical exam:  Abdomen soft, nontender, lungs clear, heart regular without murmur, no peripheral edema, normal speech, clear sensorium, follows commands, moves all extremities x4, sensation intact, cranial nerves III through XII intact, memory intact  Assessment:  Well-appearing, patient states that she frequently gets bouts of nausea related to stomach gas, improved significantly after belching, is asymptomatic at this time with an EKG which is nonischemic, labs pending  Medical screening examination/treatment/procedure(s) were performed by non-physician practitioner and as supervising physician I was immediately available for consultation/collaboration.   Vida Roller, MD 12/17/11 [phone]

## 2011-12-17 NOTE — Discharge Instructions (Signed)
Take antibiotic in its complete course. Stay well-hydrated. Followup with your primary care physician in one week for recheck of your urine. Return to emergency department for any changing or worsening symptoms.   Urinary Tract Infection Infections of the urinary tract can start in several places. A bladder infection (cystitis), a kidney infection (pyelonephritis), and a prostate infection (prostatitis) are different types of urinary tract infections (UTIs). They usually get better if treated with medicines (antibiotics) that kill germs. Take all the medicine until it is gone. You or your child may feel better in a few days, but TAKE ALL MEDICINE or the infection may not respond and may become more difficult to treat. HOME CARE INSTRUCTIONS   Drink enough water and fluids to keep the urine clear or pale yellow. Cranberry juice is especially recommended, in addition to large amounts of water.   Avoid caffeine, tea, and carbonated beverages. They tend to irritate the bladder.   Alcohol may irritate the prostate.   Only take over-the-counter or prescription medicines for pain, discomfort, or fever as directed by your caregiver.  To prevent further infections:  Empty the bladder often. Avoid holding urine for long periods of time.   After a bowel movement, women should cleanse from front to back. Use each tissue only once.   Empty the bladder before and after sexual intercourse.  FINDING OUT THE RESULTS OF YOUR TEST Not all test results are available during your visit. If your or your child's test results are not back during the visit, make an appointment with your caregiver to find out the results. Do not assume everything is normal if you have not heard from your caregiver or the medical facility. It is important for you to follow up on all test results. SEEK MEDICAL CARE IF:   There is back pain.   Your baby is older than 3 months with a rectal temperature of 100.5 F (38.1 C) or higher  for more than 1 day.   Your or your child's problems (symptoms) are no better in 3 days. Return sooner if you or your child is getting worse.  SEEK IMMEDIATE MEDICAL CARE IF:   There is severe back pain or lower abdominal pain.   You or your child develops chills.   You have a fever.   Your baby is older than 3 months with a rectal temperature of 102 F (38.9 C) or higher.   Your baby is 66 months old or younger with a rectal temperature of 100.4 F (38 C) or higher.   There is nausea or vomiting.   There is continued burning or discomfort with urination.  MAKE SURE YOU:   Understand these instructions.   Will watch your condition.   Will get help right away if you are not doing well or get worse.  Document Released: 06/18/2005 Document Revised: 08/28/2011 Document Reviewed: 01/21/2007 [facility] Patient Information 2012 [place], [place].

## 2011-12-17 NOTE — ED Provider Notes (Signed)
Medical screening examination/treatment/procedure(s) were conducted as a shared visit with non-physician practitioner(s) and myself.  I personally evaluated the patient during the encounter  Please see my separate respective documentation pertaining to this patient encounter   Vida Roller, MD 12/17/11 [phone]

## 2011-12-29 ENCOUNTER — Ambulatory Visit (INDEPENDENT_AMBULATORY_CARE_PROVIDER_SITE_OTHER): Payer: Medicare Other | Admitting: Sports Medicine

## 2011-12-29 VITALS — BP 138/88

## 2011-12-29 DIAGNOSIS — M25519 Pain in unspecified shoulder: Secondary | ICD-10-CM

## 2011-12-29 DIAGNOSIS — R531 Weakness: Secondary | ICD-10-CM

## 2011-12-29 DIAGNOSIS — R5381 Other malaise: Secondary | ICD-10-CM
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
## 2011-12-29 NOTE — Patient Instructions (Addendum)
Please get walking stick to use for balance- [employer] have these  Restart creatine once daily  Continue physical therapy  Good options for new primary care physicians are: Dr. Kirby Funk at [facility] or Dr. Ancil Boozer at [facility] on [address]  Please follow up after you finish you next 8 sessions  Thank you for seeing [place] today!

## 2011-12-29 NOTE — Assessment & Plan Note (Signed)
This is making her more prone to falls  PT assessment still points to higher risk  Suggested walking stick  Suggested restart of creatine  Reck 6 wks
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
## 2012-01-26 ENCOUNTER — Ambulatory Visit (INDEPENDENT_AMBULATORY_CARE_PROVIDER_SITE_OTHER): Payer: Medicare Other | Admitting: Sports Medicine

## 2012-01-26 ENCOUNTER — Encounter: Payer: Self-pay | Admitting: Sports Medicine

## 2012-01-26 VITALS — BP 146/82

## 2012-01-26 DIAGNOSIS — M719 Bursopathy, unspecified: Secondary | ICD-10-CM

## 2012-01-26 DIAGNOSIS — R531 Weakness: Secondary | ICD-10-CM

## 2012-01-26 DIAGNOSIS — R5383 Other fatigue: Secondary | ICD-10-CM

## 2012-01-26 DIAGNOSIS — M67919 Unspecified disorder of synovium and tendon, unspecified shoulder: Secondary | ICD-10-CM

## 2012-01-26 DIAGNOSIS — M479 Spondylosis, unspecified: Secondary | ICD-10-CM

## 2012-01-26 NOTE — Assessment & Plan Note (Signed)
I think this is some general debility from aging  Recent UTIs have increased weakness Use cranberry juice/  Get followup for UTI with Dr Alm/ finish Cipro today  Keep using creatine as a supplement  Use cane  Reck with me in 6 weeks and I will try to increase exercises for strength if she is stable
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
## 2012-01-26 NOTE — Assessment & Plan Note (Signed)
This is stable with some persistent RT supraspinatus weakness  Keep up HEP
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
## 2012-01-26 NOTE — Patient Instructions (Signed)
Please start drinking cranbery juce this will help with your urinary tract infection  Please follow up in 1 month  Thank you for seeing [place] today!

## 2012-02-25 ENCOUNTER — Ambulatory Visit: Payer: PRIVATE HEALTH INSURANCE | Admitting: Sports Medicine

## 2012-03-03 ENCOUNTER — Ambulatory Visit (INDEPENDENT_AMBULATORY_CARE_PROVIDER_SITE_OTHER): Payer: Medicare Other | Admitting: Sports Medicine

## 2012-03-03 ENCOUNTER — Encounter: Payer: Self-pay | Admitting: Sports Medicine

## 2012-03-03 VITALS — BP 152/84 | Ht 66.0 in | Wt 125.0 lb

## 2012-03-03 DIAGNOSIS — M7989 Other specified soft tissue disorders: Secondary | ICD-10-CM | POA: Insufficient documentation

## 2012-03-03 DIAGNOSIS — M719 Bursopathy, unspecified: Secondary | ICD-10-CM

## 2012-03-03 DIAGNOSIS — M67919 Unspecified disorder of synovium and tendon, unspecified shoulder: Secondary | ICD-10-CM

## 2012-03-03 DIAGNOSIS — M479 Spondylosis, unspecified: Secondary | ICD-10-CM

## 2012-03-03 NOTE — Assessment & Plan Note (Signed)
Currently with discrete parathoracic spasm. HEP. Pt declines pharmacologic or interventional type treatments.

## 2012-03-03 NOTE — Assessment & Plan Note (Signed)
Resolved with HEP. Cont exercises. RTC prn for this.
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
## 2012-03-03 NOTE — Assessment & Plan Note (Signed)
Some venous insufficiency based on pump testing. Wear compression stockings. BP high, consider low dose diuretic (pt to discuss with PCP re further eval of swelling)

## 2012-06-03 ENCOUNTER — Ambulatory Visit (INDEPENDENT_AMBULATORY_CARE_PROVIDER_SITE_OTHER): Payer: Medicare Other | Admitting: Sports Medicine

## 2012-06-03 ENCOUNTER — Encounter: Payer: Self-pay | Admitting: Sports Medicine

## 2012-06-03 VITALS — BP 142/90 | HR 81 | Ht 66.0 in | Wt 128.0 lb

## 2012-06-03 DIAGNOSIS — M1710 Unilateral primary osteoarthritis, unspecified knee: Secondary | ICD-10-CM

## 2012-06-03 DIAGNOSIS — IMO0002 Reserved for concepts with insufficient information to code with codable children: Secondary | ICD-10-CM

## 2012-06-03 DIAGNOSIS — R5381 Other malaise: Secondary | ICD-10-CM

## 2012-06-03 DIAGNOSIS — R531 Weakness: Secondary | ICD-10-CM

## 2012-06-03 DIAGNOSIS — R5383 Other fatigue: Secondary | ICD-10-CM

## 2012-06-03 NOTE — Assessment & Plan Note (Signed)
No recent falls so there has been some improvement  Given some hip abduction exercises  Gradually increase her aerobic workload  Recheck 3 months

## 2012-06-03 NOTE — Progress Notes (Signed)
  Subjective:    Patient ID: [name], female    DOB: 08/30/1927, 78 y.o.   MRN: [id number]  HPI  Pt presents to clinic for f/u of arthritis of knees and back. She is feeling stronger overall. If she does increased activity she has some back pain, but this is relieved with the home PT exercises. Knees do not feel as strong as she would like. Pt is walking 1/2 mile on treadmill and ride stationary bike for 15 mins  No recent falls  She has a series of back exercises that work well   Review of Systems     Objective:   Physical Exam  Knee exam: Rt knee -5 deg extension medial collapse with valgus shift Lt knee - 3 deg extension, medial spurring, not as much valgus change Distal quad atrophy bilat, but better muscle tone in mid quad  Weak hip abduction bilat Hip flexion weak Rt > lt  With walking gait- she is able to do a 1 point turn, but most often does a 2 point turn  She can get out of a chair about must rock forward to accomplish this or else she has to use her hands to help her pushup       Assessment & Plan:

## 2012-06-03 NOTE — Assessment & Plan Note (Signed)
Strength program prescribed  We gave her a series of exercises to help improve both quadriceps and hip flexor strength

## 2012-06-03 NOTE — Patient Instructions (Addendum)
Practice standing up from a chair or bed without using hands or body motion 3 to 5 times  Stand with heels elevated on a book and bend your knees 20 degrees then straighten- do this 5 times  Stand next to wall and push against it with your foot until you feel the outside of your upper leg tighten- hold 5 seconds and repeat 5 times  Front leg lifts while standing 5 times each leg  Continue current exercises for back  Do not worry about speed on treadmill- work on increasing time.  Goal is to gradually build up to 30 minutes   Goal is to get 30 minutes of cardiovascular exercise daily  Please follow up in 3 months  Thank you for seeing [place] today!

## 2012-08-03 ENCOUNTER — Encounter: Payer: Self-pay | Admitting: Sports Medicine

## 2012-08-03 ENCOUNTER — Ambulatory Visit (INDEPENDENT_AMBULATORY_CARE_PROVIDER_SITE_OTHER): Payer: Medicare Other | Admitting: Sports Medicine

## 2012-08-03 VITALS — BP 142/90 | HR 80 | Ht 66.0 in | Wt 128.0 lb

## 2012-08-03 DIAGNOSIS — M67919 Unspecified disorder of synovium and tendon, unspecified shoulder: Secondary | ICD-10-CM

## 2012-08-03 DIAGNOSIS — M810 Age-related osteoporosis without current pathological fracture: Secondary | ICD-10-CM
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
## 2012-08-03 NOTE — Progress Notes (Signed)
Patient ID: [name], female   DOB: [birth year], 78 y.o.   MRN: [id number]  SUBJECTIVE: [name] is a 78 y.o. female who presents for f/u of most recent DEXA scan as well as new onset RIGHT shoulder pain.  Reviewed DEXA scan in detail with patient and discussed the 13% loss from her left ilium as compared to previous study, reviewed old labs as well.  Discussed management options with patient in detail.  Pt also reports pain in RIGHT shoulder.  Pt reports majority of her pain is with activities that involve extension or any form of carrying weight.  She has previously been diagnosed with partial biceps tendon tear and supraspinatus tear on RIGHT (both) via U/S here in office.  She denies any specific trauma, falls or inciting event/injury for most recent pain.  Denies any locking, catching or instability of the shoulder but reports feeling and hearing crepitus, popping with flexion, extension and abduction (greatest in extension).   Denies any radicular symptoms.  Pt reports noticing weakness primarily in flexion, abduction since symptom onset.  Denies any swelling, denies any erythema.  Reports that previous tendinopathies responded well to physical therapy.  PMHx: - reviewed and unchanged.  Most relevant to today's visit includes biceps tenidinopathy, supraspinatus tendinopathy, shoulder osteoarthrits (and multiple other joints) and osteopenia.  PSHx: Reviewed  Meds: Reviewed, no recent changes  Allergies: Ultram (tramadol) --> strange feelings / dreams / reactions   OBJECTIVE: Vital signs as noted above. 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 No appreaciable atrophy compared to contralateal side  indicating acute nature of both processes - AROM limited to 80-90 degrees in shoulder abduction on RIGHT, flexion / extension, IR / ER symmetric and appropriate bilaterally.  PROM in abduction symmetric to left; pain with PASSIVE flexion, extension and abduction - Pt with significant palpatory and audible crepitus in RIGHT shoulder during flexion, extension (greatest) but present in most every other plane as well.  Only painful with flexion/extension in active, passive or hanging pendulum movements - 4/5 strength in RIGHT shoulder abduction, flexion and RIGHT elbow flexion compared to left.  5/5 and symmetric in all other movements - Positive empty can, negative full can on RIGHT - Speed's and Yergeuson's not performed due to obvious rupture and exam findings above - Negative Leanord Asal, Neer - Negative O'Briens - Postive cross-arm test RIGHT - Positive AC compression test RIGHT  As noted with above exam there were significant findings to suggest global osteoarthritis in shoulder, especially at [facility] joint.  Bone-on-bone felt / heard with shoulder flexion and extension (even when hanging or passive) because of ruptured supraspinatus, biceps (short-head)  ASSESSMENT: 1. Right Biceps (short-head) rupture 2. Right Supraspinatus rupture 3. Osteopenia 4. RIGHT shoulder osteoarthritis 5. Social work concerns (patient self-care, finances)  Plan: Pt with historical and physical findings above to suggest biceps tendon rupture (short-head) and supraspinatus tear both in RIGHT shoulder.  Pt instructed to continue ROM exercises in pendulum form that are non-painful and to walk her arms up table and up wall to continue to maintain good ROM and function in shoulder.  Told patient to avoid any strengthening exercises as these would be painful, accelerate decay in now potentially unstable, arthritic GH joint on RIGHT side.  Pt  agreed with above plane.  Osteopenia and most recent DEXA discussed.  As per  recommendations on report, recommended to patient she repeat this in 2 years.  Also, given report and labs reviewed with patient from her most recent PCP visit we recommended she begin Calcium Citrate and Vitamin D in oral preparations daily to combat potential bone loss.  Socially, patient expressed concerns over being able to care for herself, her home and her property given most recent findings from today and ongoing concern over financially being unable to afford assisted living facility / community.  Plan for social work consult / advisement on this issue.  She will follow-up prn for shoulder and any of the above issues.  -- Kenney Houseman, MS IV

## 2012-08-03 NOTE — Assessment & Plan Note (Signed)
Needs to find a way to get in her Ca and Vit D

## 2012-08-04 ENCOUNTER — Encounter: Payer: Self-pay | Admitting: Sports Medicine

## 2012-08-06 ENCOUNTER — Encounter: Payer: Self-pay | Admitting: Sports Medicine

## 2012-08-06 ENCOUNTER — Encounter: Payer: Self-pay | Admitting: *Deleted

## 2012-08-26 ENCOUNTER — Telehealth: Payer: Self-pay | Admitting: *Deleted

## 2012-08-26 DIAGNOSIS — M25519 Pain in unspecified shoulder: Secondary | ICD-10-CM

## 2012-08-26 NOTE — Telephone Encounter (Signed)
Message left on our office voicemail from [facility] with [facility].   Patient wants to pursue PT for shoulder,arm, and bicep pain.  Has Medicare and will need referral faxed to [phone].  Will route note to Sports Med (Dr. Darrick Penna).  Gaylene Brooks, RN

## 2012-08-27 NOTE — Telephone Encounter (Signed)
Referral done per Dr. Darrick Penna

## 2012-08-31 ENCOUNTER — Encounter: Payer: Self-pay | Admitting: Sports Medicine

## 2012-09-02 ENCOUNTER — Ambulatory Visit (INDEPENDENT_AMBULATORY_CARE_PROVIDER_SITE_OTHER): Payer: Medicare Other | Admitting: Sports Medicine

## 2012-09-02 VITALS — BP 126/70 | Ht 66.0 in | Wt 128.0 lb

## 2012-09-02 DIAGNOSIS — M719 Bursopathy, unspecified: Secondary | ICD-10-CM

## 2012-09-02 NOTE — Progress Notes (Signed)
Patient ID: [name], female   DOB: 1926/11/15, 78 y.o.   MRN: [id number]  Patient first had RC problems in 2009 and got better w therapy  Worsened and I saw her in 2010 with partial RC tear and partial BT tear  On last visit the RT arm showed Biceps rupture/ completer RC tear w retraction and limited motion  Working with PT Renae Fickle).  The shoulder motion and strength has improved since her last visit. The other key finding on the last visit was real crepitation of the humeral head grinding on the undersurface of the acromion. Since she has been doing the exercises this is also lessens but occasionally she still gets grinding and irritation on the he humeral head.  Since her last visit she has not had any falls. She is using her cane regularly except when she goes to the supermarket and can use a cart.  Her osteoporosis and bone density scans continue to worsen. She took 10 years of Actonel and does not really want to take more medications. She is currently taking a sulfa medication from Dr Asencion Islam.  She does not really want to take Forteo but Dr. Uvaldo Rising offered her this as an alternative.  Physical examination Patient appears a bit depressed and she states that she's been very worried because she seems less able to take care of herself while living alone  Range of motion of the right shoulder has improved on forward flexion she can get up to about 100. On abduction she can voluntarily get up to 80. Walking up the wall for support on forward flexion she can get 140 but still gets pain on abduction to 80.  Back scratch she is able to get to L2  Internal rotation and external rotation at waist level reveal moderate weakness on external rotation on the right but good strength on internal rotation. External rotation strength has improved

## 2012-09-02 NOTE — Patient Instructions (Addendum)
[name] has really helped you improve your shoulder motion and you have less crunching of humeral head in the joint  You can safely do some biceps curls   You can safely do some light weight in and out at waist level (internal and external rotation)  Continue doing wall walks and working on your motion with [name]  Use cane consistently to prevent falls  Please follow up in 2 months  Thank you for seeing [place] today!

## 2012-09-02 NOTE — Assessment & Plan Note (Signed)
I think she is learning to use her accessory muscles to do most shoulder motion but she clearly has improved but working with the physical therapist. I suggested that she continue working with him. However, I did mention to her that she does not have a lot of rotator cuff function and that some motions will be harder to improve. She will continue to work on the exercise program both at physical therapy and at home. She will see me in about 2 months and we'll reevaluate her shoulder flexion at that time.

## 2012-11-02 ENCOUNTER — Ambulatory Visit (INDEPENDENT_AMBULATORY_CARE_PROVIDER_SITE_OTHER): Payer: Medicare Other | Admitting: Sports Medicine

## 2012-11-02 VITALS — BP 140/80 | Ht 66.0 in | Wt 128.0 lb

## 2012-11-02 DIAGNOSIS — M67919 Unspecified disorder of synovium and tendon, unspecified shoulder: Secondary | ICD-10-CM

## 2012-11-02 DIAGNOSIS — G5750 Tarsal tunnel syndrome, unspecified lower limb: Secondary | ICD-10-CM

## 2012-11-02 DIAGNOSIS — M719 Bursopathy, unspecified: Secondary | ICD-10-CM

## 2012-11-02 NOTE — Assessment & Plan Note (Signed)
Continue with PT and with HEP  This is becoming more functional Important to keep working strength to help her with ADLs as she lives alone  Uses cane more in RT hand  Reck 2 mos

## 2012-11-02 NOTE — Assessment & Plan Note (Signed)
Cont in sports insoles with heel support  These have lessened her foot pain and her walking is much better

## 2012-11-02 NOTE — Progress Notes (Signed)
HPI: Patient is an 79 y/o female who presents to follow up for right shoulder pain after being found in November 2013 to have Right biceps rupture and rotator cuff tear (right supraspinatus rupture).  Patient states that her shoulder has been doing much better and she continues with her physical therapy. She states that she is able to flex and abduct her arm much better than before, but she uses accessory muscles and PT is working with her in attempt to strengthen muscles of shoulder region.   Of note, she states she was previously seen for right foot pain, which was also improved.  She has no new complaints regarding foot, but requests old insoles be replaced.  These have helped a lot.  ROS: As above.  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  On exam today flexion, abduction, external rotation strength is 4/5 (compared with 5/5 on left side).  Patient can abduct arm to about 80 degrees and can flex arm to about 150 degrees, much improved from exam before.  Speed's, Yergeuson's, Hawkins, and O'brien's tests negative. No pain on crossover reported. +TTP noted at anterior [facility] joint.  Good internal rotation - patient can place hands behind her back to level of T12-L1.  A/P: Biceps Tendon rupture and supraspinatus tear: Patient clinically is much improved.  Strength and range of motion are improving.  Patient reports improvement in pain.  Patient instructed to continue with physical therapy and ROM exercises.  Patient to followup in 6 weeks. Foot pain: Patient states nearly resolved, no new complaints, however new insoles have been fitted for her newer shoes. Older ones do appear worn out.

## 2012-11-03 ENCOUNTER — Ambulatory Visit: Payer: Medicare Other | Admitting: Sports Medicine

## 2013-01-05 ENCOUNTER — Telehealth: Payer: Self-pay | Admitting: *Deleted

## 2013-01-05 NOTE — Telephone Encounter (Signed)
Message copied by Jacki Cones C on Wed Jan 05, 2013  9:05 AM ------      Message from: Enid Baas      Created: Tue Jan 04, 2013  9:19 PM      Regarding: RE: phone message      Contact: [phone]       Add a brief note that she had another fall and if she id not better with PT we will see her      ----- Message -----         From: Lizbeth Bark         Sent: 01/04/2013   1:41 PM           To: Enid Baas, MD      Subject: phone message                                            Pt called to let you know that she had a fall last week, went to [facility] and they took xray's of her knee's and hip, no broken bones. Just wanted you to be aware.  Her PT will be sending you information on her.  She didn't want to come in for an appt, just wanted to keep you updated on her fall.       ------

## 2013-03-09 ENCOUNTER — Ambulatory Visit
Admission: RE | Admit: 2013-03-09 | Discharge: 2013-03-09 | Disposition: A | Discharge status: Home / Self Care | Payer: Medicare Other | Source: Ambulatory Visit | Attending: Sports Medicine | Admitting: Sports Medicine

## 2013-03-09 ENCOUNTER — Ambulatory Visit (INDEPENDENT_AMBULATORY_CARE_PROVIDER_SITE_OTHER): Payer: Medicare Other | Admitting: Sports Medicine

## 2013-03-09 VITALS — BP 140/70 | Ht 66.0 in | Wt 128.0 lb

## 2013-03-09 DIAGNOSIS — R140 Abdominal distension (gaseous): Secondary | ICD-10-CM

## 2013-03-09 DIAGNOSIS — M7989 Other specified soft tissue disorders: Secondary | ICD-10-CM

## 2013-03-09 DIAGNOSIS — R141 Gas pain: Secondary | ICD-10-CM

## 2013-03-09 DIAGNOSIS — M1710 Unilateral primary osteoarthritis, unspecified knee: Secondary | ICD-10-CM

## 2013-03-09 DIAGNOSIS — K5900 Constipation, unspecified: Secondary | ICD-10-CM

## 2013-03-09 DIAGNOSIS — IMO0002 Reserved for concepts with insufficient information to code with codable children: Secondary | ICD-10-CM

## 2013-03-09 NOTE — Assessment & Plan Note (Signed)
I think this is a severe problem and I worried about her developing a high impaction. Because of her worries about medicines it seems that she has stopped the medicines that were working for her. I will get her to return to her family physician because of think we need to see that this is under treatment before she gets any worse.

## 2013-03-09 NOTE — Assessment & Plan Note (Signed)
This was worsened by soft tissue trauma after her fall  However, I am concerned that her abdominal distention which is much more on the left side of her abdomen may be impairing venous return and increasing the amount of edema in her left leg  Continued elevation of the leg and also compression stockings

## 2013-03-09 NOTE — Progress Notes (Signed)
Patient ID: [name], female   DOB: 03-Apr-1927, 79 y.o.   MRN: [id number]  Seeing Renae Fickle PT for balance and arm pain Larey Seat in home in April Had taken Airborne and fell when she felt [employer] back/ cut ear/ no fractures Had Xrays left lower leg and hip at [place] [facility] Dr Shon Baton saw her Next week saw Dr Charlann Boxer Had black and blue swelling lateral leg to foot Now some persistent lower leg swelling Compression socks help  Takes thyroid Amitiza for constipation - sees Dr Dulce Sellar Has stopped amitiza and stopped Librarian, academic (probiotic) Still getting stomach bloating Now with loose liquid stools/ no firm stools  Osteoporosis is severe Sees Dr Uvaldo Rising Taking sulfa type product ? from Dr Asencion Islam for osteo Takes a number of "natural products" and can't remember names of all  Fam Hx of severe constipation In childhood she was on castor oil/ then syrup of figs/ then enemas  Possibility of long term poor GI motility   Examination Alert and NAD  2+ edema of the LT lower extremities vs 1+ of RT  Hip rotation is slightly less than normal on left and less than on RT Strength of hips is actually good for age  Marked abdominal distention Tympanitic Some high pitched bowel sounds  AP view of the abdomen Marked constipation with distended colonic ileus

## 2013-03-09 NOTE — Assessment & Plan Note (Signed)
This seems stable and she has improved with physical therapy exercises. She does have pretty good strength for her age

## 2013-03-31 ENCOUNTER — Other Ambulatory Visit: Payer: Self-pay | Admitting: Dermatology

## 2013-11-23 ENCOUNTER — Encounter: Payer: Self-pay | Admitting: Sports Medicine

## 2013-11-23 ENCOUNTER — Ambulatory Visit (INDEPENDENT_AMBULATORY_CARE_PROVIDER_SITE_OTHER): Payer: Medicare Other | Admitting: Sports Medicine

## 2013-11-23 VITALS — BP 126/70 | Ht 66.0 in | Wt 125.0 lb

## 2013-11-23 DIAGNOSIS — M7989 Other specified soft tissue disorders: Secondary | ICD-10-CM

## 2013-11-23 DIAGNOSIS — R531 Weakness: Secondary | ICD-10-CM

## 2013-11-23 DIAGNOSIS — R5383 Other fatigue: Secondary | ICD-10-CM

## 2013-11-23 DIAGNOSIS — R5381 Other malaise: Secondary | ICD-10-CM

## 2013-11-23 NOTE — Progress Notes (Signed)
Patient ID: MICOLE DELEHANTY, female   DOB: 1926/10/25, 80 y.o.   MRN: [id number]  Here with a complaint of  bialteral knee pain/ lower leg and ankle swelling  Patient has been followed by me for problems with musculoskeletal injuries and trying to gain enough strength to avoid falling and be able to remain independent  When we started a more aggressive physical therapy program she had 3-4 falls over about a 4 month time resulting in some significant injuries. She has now gone approximately 11 months since her last significant fall. She does use her cane consistently.  She is doing strength exercises given to her by me and by a physical therapist. She also goes to chair yoga.  Recently some pain in both knees but no swelling. The pain is worst in the left knee which she fell on last April. She also complains of bilateral foot swelling.  The right ankle had a significant injury at one time and it also swells more by the end of the day.  Physical examination  Elderly white female in no acute distress BP 126/70  Ht 5\' 6"  (1.676 m)  Wt 125 lb (56.7 kg)  BMI 20.19 kg/m2  She is unable to get out of a regular chair without using her arms She walks with only minimal sway  Both knees show full extension. There is some crepitation under the right patella. No effusion on either knee. Remainder of knee exam unremarkable.  Right ankle is slightly swollen when compared to the left. Ligamentous testing is stable.  On both lower extremities but more on the left there are multiple varicosities both superficial and I think some are deep. There is some nonspecific edema from the lower shin to the feet.

## 2013-11-23 NOTE — Assessment & Plan Note (Signed)
Most of this appears to be venous insufficiency along with multiple superficial varicosities and what appear to be deep varicosities. She has been advised wear her compression hose but doesn't feel like she can use these. I suggested trying to use an Ace wrap for 30-40 minutes at a time once or twice daily wrapping from the feet upward to get rid of some the excess swelling.  She will return in approximately 4 months for recheck

## 2013-11-23 NOTE — Assessment & Plan Note (Signed)
Her strength work as improve substantially as she is having far fewer falls.  We added some standing hip flexion, knee extension, hip extension and continue hip abduction.  Continue her yoga class.  Continue to use the cane.

## 2014-01-27 ENCOUNTER — Other Ambulatory Visit: Payer: Self-pay | Admitting: Family Medicine

## 2014-01-27 DIAGNOSIS — R0989 Other specified symptoms and signs involving the circulatory and respiratory systems: Secondary | ICD-10-CM

## 2014-01-30 ENCOUNTER — Ambulatory Visit
Admission: RE | Admit: 2014-01-30 | Discharge: 2014-01-30 | Disposition: A | Discharge status: Home / Self Care | Payer: Medicare Other | Source: Ambulatory Visit | Attending: Family Medicine | Admitting: Family Medicine

## 2014-01-30 DIAGNOSIS — R0989 Other specified symptoms and signs involving the circulatory and respiratory systems: Secondary | ICD-10-CM

## 2014-03-02 ENCOUNTER — Ambulatory Visit (INDEPENDENT_AMBULATORY_CARE_PROVIDER_SITE_OTHER): Payer: Medicare Other | Admitting: Sports Medicine

## 2014-03-02 ENCOUNTER — Encounter: Payer: Self-pay | Admitting: Sports Medicine

## 2014-03-02 VITALS — BP 124/70 | Ht 66.0 in | Wt 125.0 lb

## 2014-03-02 DIAGNOSIS — T07XXXA Unspecified multiple injuries, initial encounter: Secondary | ICD-10-CM

## 2014-03-02 DIAGNOSIS — R531 Weakness: Secondary | ICD-10-CM

## 2014-03-02 DIAGNOSIS — R5383 Other fatigue: Secondary | ICD-10-CM

## 2014-03-02 DIAGNOSIS — R5381 Other malaise: Secondary | ICD-10-CM

## 2014-03-02 DIAGNOSIS — IMO0002 Reserved for concepts with insufficient information to code with codable children: Secondary | ICD-10-CM

## 2014-03-02 NOTE — Progress Notes (Signed)
Patient ID: [name], female   DOB: Oct 02, 1926, 80 y.o.   MRN: [id number]  Patient is 43 and lives alone We are concerned about her falling and stability at home No falls since last visit Does keep a series of exercises and has started back on these after recovering from last fall 4 mos ago  This past week she scratched her foot working in yard Came in and had copious bleeding from a punctured varicose vein RT lateral ankle She faints with blood and did faint Evaluated by EMS with low BP and given IVFs at home Refused ER transfer Next day saw Dr Leonides Schanz and other than abrasions no serious injuries  Has been to urgent care once to check wounds  Also issues with incontinence  Exam NAD BP 124/70  Ht 5\' 6"  (1.676 m)  Wt 125 lb (56.7 kg)  BMI 20.19 kg/m2  Difficult getting chair to stand Balance is poor without cane  Abrasion left forearm with no signs of cellulitis Scab over RT lat malleolus  Demonstrates all exercises OK Back evaluation shows spurring from old compression No tenderness or bruising  Assess High Fall Risk I discussed getting into assisted living Safety for home  Abrasions Re dressed today  Weakness Keep up with muscle strength work  Varicosities Careful with any skin trauma

## 2014-03-02 NOTE — Patient Instructions (Signed)
Work on your fall prevention  Check to see if things are in the way at home Have something to hold onto as you walk  Do exercises:  Rise from bed to stand Lateral leg lifts Hip flexion Ankle exercises Heel raises  Keep follow up every 3 months

## 2014-03-07 ENCOUNTER — Ambulatory Visit: Payer: Medicare Other | Admitting: Sports Medicine

## 2014-05-09 ENCOUNTER — Encounter: Payer: Self-pay | Admitting: Vascular Surgery

## 2014-05-09 ENCOUNTER — Other Ambulatory Visit: Payer: Self-pay | Admitting: *Deleted

## 2014-05-09 DIAGNOSIS — I83893 Varicose veins of bilateral lower extremities with other complications: Secondary | ICD-10-CM

## 2014-05-12 ENCOUNTER — Encounter: Payer: Self-pay | Admitting: Vascular Surgery

## 2014-05-15 ENCOUNTER — Ambulatory Visit (INDEPENDENT_AMBULATORY_CARE_PROVIDER_SITE_OTHER): Payer: Medicare Other | Admitting: Vascular Surgery

## 2014-05-15 ENCOUNTER — Ambulatory Visit (HOSPITAL_COMMUNITY)
Admission: RE | Admit: 2014-05-15 | Discharge: 2014-05-15 | Disposition: A | Discharge status: Home / Self Care | Payer: Medicare Other | Source: Ambulatory Visit | Attending: Vascular Surgery | Admitting: Vascular Surgery

## 2014-05-15 ENCOUNTER — Encounter: Payer: Self-pay | Admitting: Vascular Surgery

## 2014-05-15 VITALS — BP 190/100 | HR 84 | Temp 97.6°F | Resp 16 | Ht 67.0 in | Wt 125.0 lb

## 2014-05-15 DIAGNOSIS — I83893 Varicose veins of bilateral lower extremities with other complications: Secondary | ICD-10-CM | POA: Insufficient documentation

## 2014-05-15 NOTE — Progress Notes (Signed)
Subjective:     Patient ID: [name], female   DOB: 08-14-1927, 80 y.o.   MRN: [id number]  HPI this 80 year old female was referred by Dr. Theadore Nan for evaluation of a bleeding episode from varicosities in the left leg which occurred in early June of 2015. Patient also consult with Dr. Mingo Amber for her medical problems. Patient has no history of DVT, thrombophlebitis, pulmonary embolus, stasis ulcers. She did have an episode when she was getting out of the bathtub when she had brisk bleeding occurred spontaneously at the left ankle area. She then fainted and when she awoke the bleeding had stopped. She called EMS. She has no history previously of bleeding from the left leg. She denies significant pain but has had significant swelling over the years in both the left and right legs.  Past Medical History  Diagnosis Date  . Cancer   . Breast cancer, left breast   . Osteoporosis   . Thyroid disease   . Hypertension     History  Substance Use Topics  . Smoking status: Never Smoker   . Smokeless tobacco: Not on file  . Alcohol Use: No    Family History  Problem Relation Age of Onset  . Cancer Sister     breast     Allergies  Allergen Reactions  . Latex     Skin rash with bandaids and tapes  . Ultram [Tramadol Hcl] Nausea Only    Current outpatient prescriptions:AMITIZA 8 MCG capsule, Take 8 mcg by mouth daily., Disp: , Rfl: ;  amoxicillin (AMOXIL) 500 MG tablet, Take 500 mg by mouth as needed., Disp: , Rfl: ;  LOVAZA 1 G capsule, Take 1 g by mouth 2 (two) times daily. , Disp: , Rfl: ;  Magnesium 500 MG CAPS, Take 1,000 mg by mouth daily., Disp: , Rfl: ;  oxybutynin (OXYTROL FOR WOMEN) 3.9 MG/24HR, Place 1 patch onto the skin 2 (two) times a week., Disp: , Rfl:  Polyethyl Glycol-Propyl Glycol (SYSTANE PRESERVATIVE FREE) 0.4-0.3 % SOLN, Apply to eye., Disp: , Rfl: ;  RESTASIS 0.05 % ophthalmic emulsion, , Disp: , Rfl: ;  thyroid (ARMOUR) 30 MG tablet, Take 60 mg by mouth  daily. , Disp: , Rfl: ;  UNABLE TO FIND, Take by mouth daily. FULVIC ACID LIQUID, Disp: , Rfl:   BP 190/100  Pulse 84  Temp(Src) 97.6 F (36.4 C) (Oral)  Resp 16  Ht 5\' 7"  (1.702 m)  Wt 125 lb (56.7 kg)  BMI 19.57 kg/m2  SpO2 97%  Body mass index is 19.57 kg/(m^2).           Review of Systems denies chest pain, dyspnea on exertion, PND, orthopnea. Does complain of weakness and a history of chills. Other systems negative complete review of systems    Objective:   Physical Exam BP 190/100  Pulse 84  Temp(Src) 97.6 F (36.4 C) (Oral)  Resp 16  Ht 5\' 7"  (1.702 m)  Wt 125 lb (56.7 kg)  BMI 19.57 kg/m2  SpO2 97%  Gen.-alert and oriented x3 in no apparent distress- HEENT normal for age Lungs no rhonchi or wheezing Cardiovascular regular rhythm no murmurs carotid pulses 3+ palpable no bruits audible Abdomen soft nontender no palpable masses Musculoskeletal free of  major deformities Skin clear -no rashes Neurologic normal Lower extremities 3+ femoral and dorsalis pedis pulses palpable bilaterally with 1+ edema bilaterally Left leg has reticular veins in left ankle and a very superficial subcutaneous prominent varix about 1  cm in diameter near the medial malleolus. Early hyperpigmentation noted. Notable finger callosities noted.  Tobacco order venous duplex exam which I reviewed and interpreted. There is no DVT. Both the gray and small saphenous veins have gross reflux but the small saphenous vein is a large caliber vein. The great saphenous vein is a much smaller caliber fine. There is deep venous reflux in the popliteal vein.       Assessment:     Severe gross reflux left small saphenous vein which is a large caliber vein and history of significant spontaneous bleeding from varix left ankle    Plan:         #1 long leg elastic compression stockings 20-30 mm gradient #2 elevate legs as much as possible #3 ibuprofen daily on a regular basis for pain #4 return  in 3 months-if no significant improvement then she will need laser ablation left small saphenous vein plus sclerotherapy of the location where the bleeding occurred. If she should experience further bleeding episodes in the interim over the next 3 months she should notify us immediately and we should proceed with this. Otherwise we will see her back in the office in 3 months.

## 2014-06-01 ENCOUNTER — Ambulatory Visit (INDEPENDENT_AMBULATORY_CARE_PROVIDER_SITE_OTHER): Payer: Medicare Other | Admitting: Sports Medicine

## 2014-06-01 ENCOUNTER — Encounter: Payer: Self-pay | Admitting: Sports Medicine

## 2014-06-01 VITALS — BP 144/78 | HR 89 | Ht 66.0 in | Wt 125.0 lb

## 2014-06-01 DIAGNOSIS — M810 Age-related osteoporosis without current pathological fracture: Secondary | ICD-10-CM

## 2014-06-01 DIAGNOSIS — I83893 Varicose veins of bilateral lower extremities with other complications: Secondary | ICD-10-CM

## 2014-06-01 DIAGNOSIS — M7989 Other specified soft tissue disorders: Secondary | ICD-10-CM

## 2014-06-01 DIAGNOSIS — R5381 Other malaise: Secondary | ICD-10-CM

## 2014-06-01 DIAGNOSIS — R531 Weakness: Secondary | ICD-10-CM

## 2014-06-01 DIAGNOSIS — R5383 Other fatigue: Secondary | ICD-10-CM

## 2014-06-01 NOTE — Progress Notes (Signed)
[name] - 80 y.o. female MRN [id number]  Date of birth: 06-23-1927  SUBJECTIVE:  Including CC & ROS.  The patient is following up for evaluation of: Recurrent Falls: Patient denies any falls since her last visit. She has been performing her core strengthening exercises but has not returned to her silver sneakers workouts following the episode of bleeding from lower extremity varicosity back in May.  She continues to live alone, not interested in looking into alternative housing options. Not interested in obtaining a life alert. Left Hip Snapping: Has been having 2-3 months of worsening snapping of her left hip with ambulation. Denies any significant pain. No radicular symptoms. Does not contribute to falls. Reports 25 weight loss over the past few years but none in the last year. Left Foot wound: Was seen by vascular surgery and compression socks and consideration for vascular surgery were discussed. Patient is not agreeable to wearing compression socks do to "not believe in them" and is managed since surgery. She does note that the swelling of her legs is worse at the end of the day but is essentially resolved by morning time. She denies any recurrent bleeding but does have a ulceration over the anterior medial aspect of the left ankle   HISTORY: Past Medical, Surgical, Social, and Family History Reviewed & Updated per EMR. Pertinent Historical Findings include: Thoracic compression fractures 2o/2 osteoporosis Vertebrobasilar artery syndrome Hypothyroid on Armour Thyroid Lives alone independently in her house. History of recurrent falls. Support her work consists of neighbors only. Nonsmoker.  DATA REVIEWED:  vascular surgery appointment. Recommend compression socks for venous insufficiency  OBJECTIVE FINDINGS:  VS:  HT:5\' 6"  (167.6 cm)   WT:125 lb (56.7 kg)  BMI:20.2          BP:144/78 mmHg  HR:89bpm  TEMP: ( )  RESP:   PHYSICAL EXAM:            GENERAL:  Elderly Caucasian female.  In no discomfort; no respiratory distress                PSYCH:  alert and appropriate, moderate insight  Back Exam: Overall posture significantly improved with minimal shoulder protraction. She does have a thoracic kyphosis with prominent T8 spinous process consistent with her prior compression fracture.  Lower extremity Exam: Chronic venous insufficiency with superficial varicosities and spider veins.  Large varicosity over the anteromedial aspect of the ankle with eschar formation. Get up and go test less than 3 seconds. Able to perform heel raises without difficulty. 1 foot standing balance or greater than 5 seconds bilaterally. With ambulation she doesn't snapping over the left greater trochanter. Bilateral dorsalis pedis and posterior tibialis pulses are 1+ out of 4. Good capillary refill. She is 2+ pitting edema             ASSESSMENT: 1. Varicose veins of lower extremities with other complications   2. Osteoporosis, unspecified   3. Leg swelling   4. Weakness generalized    >50% of this 25 minute visit spent in direct patient counseling and/or coordination of care.   PLAN: See problem based charting & AVS for additional documentation. She is known to have severe venous insufficiency with bleeding varicosity previously. Long-time discussed the options including elevation, compression, surgical intervention. She's not interested in surgery and does not want to wear compression socks. Discussed this will likely worsen with continued ambulation and weightbearing.  Did provide her with a 4 inch Ace bandage with Velcro to be worn at least  2-3 hours per day help with compression but have encouraged her to obtain compression socks.  She has shown some improvement in her strength in gait with no recurrent falls. She is to continue toe raises, repetitive sitting to standing, hip abduction exercises, 1 foot balance exercises progressing to doing this barefoot.   Discussed options for her safety  including life alert and looking into ALF. She declines any additional services at this time. > Return in about 4 months (around 10/01/2014). to ensure she's doing well with her exercises and has not had any recurrent falls.

## 2014-08-02 ENCOUNTER — Ambulatory Visit (INDEPENDENT_AMBULATORY_CARE_PROVIDER_SITE_OTHER): Payer: Medicare Other | Admitting: Sports Medicine

## 2014-08-02 ENCOUNTER — Encounter: Payer: Self-pay | Admitting: Sports Medicine

## 2014-08-02 VITALS — BP 138/94 | Ht 66.0 in | Wt 125.0 lb

## 2014-08-02 DIAGNOSIS — L97929 Non-pressure chronic ulcer of unspecified part of left lower leg with unspecified severity: Secondary | ICD-10-CM

## 2014-08-02 DIAGNOSIS — I83025 Varicose veins of left lower extremity with ulcer other part of foot: Secondary | ICD-10-CM

## 2014-08-02 DIAGNOSIS — I83022 Varicose veins of left lower extremity with ulcer of calf: Secondary | ICD-10-CM

## 2014-08-02 DIAGNOSIS — L97909 Non-pressure chronic ulcer of unspecified part of unspecified lower leg with unspecified severity: Secondary | ICD-10-CM

## 2014-08-02 DIAGNOSIS — I83024 Varicose veins of left lower extremity with ulcer of heel and midfoot: Secondary | ICD-10-CM

## 2014-08-02 DIAGNOSIS — I83021 Varicose veins of left lower extremity with ulcer of thigh: Secondary | ICD-10-CM

## 2014-08-02 DIAGNOSIS — I83029 Varicose veins of left lower extremity with ulcer of unspecified site: Secondary | ICD-10-CM

## 2014-08-02 DIAGNOSIS — I83023 Varicose veins of left lower extremity with ulcer of ankle: Secondary | ICD-10-CM

## 2014-08-02 DIAGNOSIS — M7989 Other specified soft tissue disorders: Secondary | ICD-10-CM

## 2014-08-02 DIAGNOSIS — I83028 Varicose veins of left lower extremity with ulcer other part of lower leg: Secondary | ICD-10-CM

## 2014-08-02 DIAGNOSIS — I83009 Varicose veins of unspecified lower extremity with ulcer of unspecified site: Secondary | ICD-10-CM | POA: Insufficient documentation

## 2014-08-02 NOTE — Patient Instructions (Signed)
It was good to see you. Keep using the vaseline and topic treatment you are doing.  Use the warm soaks as often as you can tolerate. If it starts use direct pressure with one or two fingers until it starts.  Keep elevating your legs as often as possible.  And consider elastic compression if you can tolerate it

## 2014-08-03 NOTE — Progress Notes (Signed)
[name] - 80 y.o. female MRN [id number]  Date of birth: July 26, 1927  SUBJECTIVE:  Including CC & ROS.  The patient is following up for chronic ongoing problems as below: Overall she reports doing well and feels as though she is getting along without significant difficulty at home.  History of Recurrent Falls: denies any recurrent falls. She continues to live alone, not interested in looking into alternative housing options. Not interested in obtaining a life alert.  Left Hip Snapping: overall improved not having any further symptoms today. Performing her home exercises.   Left Foot wound: no recurrence of bleeding.  She has not been able to tolerate using any type of fabricated compression garments and does not like using the Ace wrap we've provided her. She has been using a topical salve that she reports seems to be helping.  She also is using Vaseline with a Band-Aid nightly over the medial lesion that she has.  Today she brings in geriatric [employer] of [place] book on aging and references that surgery is not appropriate for deep venous insufficiency.    HISTORY: Past Medical, Surgical, Social, and Family History Reviewed & Updated per EMR. Pertinent Historical Findings include: Thoracic compression fractures 2o/2 osteoporosis Vertebrobasilar artery syndrome Hypothyroid on Armour Thyroid Lives alone independently in her house. History of recurrent falls. Support her work consists of neighbors only. Nonsmoker.  OBJECTIVE FINDINGS:  VS:  HT:5\' 6"  (167.6 cm)   WT:125 lb (56.7 kg)  BMI:20.2          BP:(!) 138/94 mmHg  HR: bpm  TEMP: ( )  RESP:   PHYSICAL EXAM:            GENERAL:  Elderly Caucasian female. In no discomfort; no respiratory distress                PSYCH:  alert and appropriate, moderate insight  Lower extremity Exam: Chronic venous insufficiency with superficial varicosities and spider veins.  Large varicosity over the anteromedial aspect of the ankle that is  significantly improved from last visit. No eschar over this wound however there is a small medial wound that is approximately 2 mm in diameter with small eschar. She does have 2+ pitting edema in bilateral lower extremities approximately from the mid shin down. Bilateral dorsalis pedis and posterior tibialis pulses are 1+ out of 4. Good capillary refill. She is 2+ pitting edema  Get up and go test less than 3 seconds. Able to perform heel raises without difficulty.              ASSESSMENT: 1. Leg swelling   2. Varicose veins of lower extremities with ulcer, left    >50% of this 25 minute visit spent in direct patient counseling and/or coordination of care.   PLAN: See problem based charting & AVS for additional documentation. She is known to have severe venous insufficiency with massively bleeding varicosity previously. Once again we spent extensive time discussing the options for management including elevation, compression, surgical intervention. She is adamant that she does not wish to do anything because she "doesn't believe in compression socks."  She did show some interest in potentially using thigh-high compression due to recommendations from a previous provider however upon giving her this option she declined  due to the difficulty associated putting them on.  - I did also counseled her on appropriate management in case of re-bleed including direct 1-2 finger pressure over active area of bleeding, avoiding bulky dressings.  She will continue  with Vaseline appointment and Band-Aid dressings over any areas of skin lesions area discussed red flags needing evaluation for potential cellulitis.  Overall her gait seems to be much better and we did encourage her to continue her home exercises.  > she is going to follow-up with Dr. Oneida Alar as scheduled in 1 month.

## 2014-08-31 ENCOUNTER — Ambulatory Visit (INDEPENDENT_AMBULATORY_CARE_PROVIDER_SITE_OTHER): Payer: Medicare Other | Admitting: Sports Medicine

## 2014-08-31 ENCOUNTER — Encounter: Payer: Self-pay | Admitting: Sports Medicine

## 2014-08-31 VITALS — BP 130/92 | Ht 66.0 in | Wt 125.0 lb

## 2014-08-31 DIAGNOSIS — I83018 Varicose veins of right lower extremity with ulcer other part of lower leg: Secondary | ICD-10-CM

## 2014-08-31 DIAGNOSIS — I83015 Varicose veins of right lower extremity with ulcer other part of foot: Secondary | ICD-10-CM

## 2014-08-31 DIAGNOSIS — I83012 Varicose veins of right lower extremity with ulcer of calf: Secondary | ICD-10-CM

## 2014-08-31 DIAGNOSIS — I83014 Varicose veins of right lower extremity with ulcer of heel and midfoot: Secondary | ICD-10-CM

## 2014-08-31 DIAGNOSIS — L97919 Non-pressure chronic ulcer of unspecified part of right lower leg with unspecified severity: Principal | ICD-10-CM

## 2014-08-31 DIAGNOSIS — I83013 Varicose veins of right lower extremity with ulcer of ankle: Secondary | ICD-10-CM

## 2014-08-31 DIAGNOSIS — I83019 Varicose veins of right lower extremity with ulcer of unspecified site: Secondary | ICD-10-CM

## 2014-08-31 DIAGNOSIS — I83011 Varicose veins of right lower extremity with ulcer of thigh: Secondary | ICD-10-CM

## 2014-08-31 NOTE — Progress Notes (Signed)
   Subjective:    Patient ID: [name], female    DOB: 05-24-1927, 80 y.o.   MRN: [id number]  HPI  LEFT LOWER EXT VARICOSE VEINS / HEALING ULCERATION: - Last seen at [facility] 05/2014 and 07/2014 for same complaint with L lower ext varicosity, previously with bleeding ulceration, previously recommended compression stockings, decision to not wear, referred by PCP to vascular specialist, patient not interested in surgical approach to treat varicose veins - Currently patient reports significant improvement since last visit. Reports no further bleeding ulceration along Left ankle/foot, still has healing small scab, no other new areas of ulceration. Primarily concerned about persistent lower ext swelling, mostly around ankle/foot, improves with rest and elevation, gradually worsens throughout day with ambulation and upright position. - Wearing light soft socks that do not provide significant compression or irritation, not interested in wearing compression stockings above hips, additionally has been using "Woojenson (?sp) liquid" topical on areas of swelling, (also uses this orally as a chinese herbal preparation given by Dr Jaynie Collins) - using previously given packets of "sterile gel" on dry areas and vaseline to keep moist - Denies any pain in lower ext, swelling in calf or rest of lower ext, rash or erythema  I have reviewed and updated the following as appropriate: allergies and current medications  Social Hx: - Never smoker  Review of Systems  See above HPI    Objective:   Physical Exam  BP 130/92 mmHg  Ht 5\' 6"  (1.676 m)  Wt 125 lb (56.7 kg)  BMI 20.19 kg/m2  Gen - well-appearing, pleasant elderly female, NAD Ext - Left Foot/Ankle: moderate sized gravity dependent venous varicosity medial ankle/foot and healing 1x1cm scab from prior ulceration, no further open ulcerations or dry scabs, non-tender to palpation, no calf edema or significant varicosity in rest of lower ext, multiple small  varicosities and venous stasis change/ cayenne pepper change to skin  peripheral pulses intact +2 b/l dp Skin - warm, dry, no rashes    Assessment & Plan:   83 yr F with PMH chronic venous stasis L>R with varicose veins of L-foot/ankle complicated by hx previous bleeding ulceration, presents for follow-up. Currently with improved venous stasis / varicosity L-lower ext, healing ulceration without any active lesions. Suggestive of gravity dependent venous stasis, secondary to thinning skin/veins with advanced age.  Plan: 1. Recommend continue to seek light / easy to use knee high compression stockings (advised to avoid significant pressure >11 lbs to avoid swelling above stockings) 2. Continue elevation, skin moisturizing with vaseline, given more sterile gel packets, place on dry areas to avoid recurrent ulcerations. 3. Counseled on avoiding the discussed surgical procedures (ablation and gel treatment for varicose veins) at this time, unless condition worsens, concerned regarding prolonged recovery time, benefits/risks 4. If develops future recurrent ulceration, recommend referral to [facility]. RTC PRN  [name], [place], PGY-2  Agree with assessment   Stefanie Libel, MD

## 2014-08-31 NOTE — Assessment & Plan Note (Signed)
See OV note  I think she has done pretty well with conservative care and I discouraged her from being too aggressive considering her age and other health issues

## 2014-12-18 ENCOUNTER — Encounter (HOSPITAL_COMMUNITY): Payer: Self-pay | Admitting: Emergency Medicine

## 2014-12-18 ENCOUNTER — Inpatient Hospital Stay (HOSPITAL_COMMUNITY)
Admission: EM | Admit: 2014-12-18 | Discharge: 2014-12-22 | DRG: 377 | Disposition: A | Discharge status: Skilled Nursing Facility | Payer: Medicare Other | Attending: Internal Medicine | Admitting: Internal Medicine

## 2014-12-18 DIAGNOSIS — E559 Vitamin D deficiency, unspecified: Secondary | ICD-10-CM | POA: Diagnosis present

## 2014-12-18 DIAGNOSIS — R109 Unspecified abdominal pain: Secondary | ICD-10-CM | POA: Diagnosis present

## 2014-12-18 DIAGNOSIS — E860 Dehydration: Secondary | ICD-10-CM | POA: Diagnosis present

## 2014-12-18 DIAGNOSIS — I480 Paroxysmal atrial fibrillation: Secondary | ICD-10-CM | POA: Diagnosis present

## 2014-12-18 DIAGNOSIS — I471 Supraventricular tachycardia, unspecified: Secondary | ICD-10-CM | POA: Diagnosis present

## 2014-12-18 DIAGNOSIS — I340 Nonrheumatic mitral (valve) insufficiency: Secondary | ICD-10-CM | POA: Diagnosis present

## 2014-12-18 DIAGNOSIS — Z66 Do not resuscitate: Secondary | ICD-10-CM | POA: Diagnosis present

## 2014-12-18 DIAGNOSIS — Z853 Personal history of malignant neoplasm of breast: Secondary | ICD-10-CM | POA: Diagnosis not present

## 2014-12-18 DIAGNOSIS — D72829 Elevated white blood cell count, unspecified: Secondary | ICD-10-CM | POA: Diagnosis present

## 2014-12-18 DIAGNOSIS — K2961 Other gastritis with bleeding: Principal | ICD-10-CM | POA: Diagnosis present

## 2014-12-18 DIAGNOSIS — K2901 Acute gastritis with bleeding: Secondary | ICD-10-CM | POA: Diagnosis not present

## 2014-12-18 DIAGNOSIS — G92 Toxic encephalopathy: Secondary | ICD-10-CM | POA: Diagnosis present

## 2014-12-18 DIAGNOSIS — I350 Nonrheumatic aortic (valve) stenosis: Secondary | ICD-10-CM | POA: Diagnosis present

## 2014-12-18 DIAGNOSIS — D62 Acute posthemorrhagic anemia: Secondary | ICD-10-CM | POA: Diagnosis present

## 2014-12-18 DIAGNOSIS — I10 Essential (primary) hypertension: Secondary | ICD-10-CM | POA: Diagnosis present

## 2014-12-18 DIAGNOSIS — K5900 Constipation, unspecified: Secondary | ICD-10-CM | POA: Diagnosis present

## 2014-12-18 DIAGNOSIS — Z803 Family history of malignant neoplasm of breast: Secondary | ICD-10-CM | POA: Diagnosis not present

## 2014-12-18 DIAGNOSIS — I4891 Unspecified atrial fibrillation: Secondary | ICD-10-CM | POA: Diagnosis not present

## 2014-12-18 DIAGNOSIS — Z79899 Other long term (current) drug therapy: Secondary | ICD-10-CM | POA: Diagnosis not present

## 2014-12-18 DIAGNOSIS — T4275XA Adverse effect of unspecified antiepileptic and sedative-hypnotic drugs, initial encounter: Secondary | ICD-10-CM | POA: Diagnosis present

## 2014-12-18 DIAGNOSIS — I341 Nonrheumatic mitral (valve) prolapse: Secondary | ICD-10-CM | POA: Diagnosis present

## 2014-12-18 DIAGNOSIS — K922 Gastrointestinal hemorrhage, unspecified: Secondary | ICD-10-CM | POA: Diagnosis present

## 2014-12-18 DIAGNOSIS — E876 Hypokalemia: Secondary | ICD-10-CM | POA: Diagnosis present

## 2014-12-18 DIAGNOSIS — K449 Diaphragmatic hernia without obstruction or gangrene: Secondary | ICD-10-CM | POA: Diagnosis present

## 2014-12-18 DIAGNOSIS — M810 Age-related osteoporosis without current pathological fracture: Secondary | ICD-10-CM | POA: Diagnosis present

## 2014-12-18 DIAGNOSIS — R4182 Altered mental status, unspecified: Secondary | ICD-10-CM

## 2014-12-18 DIAGNOSIS — T39395A Adverse effect of other nonsteroidal anti-inflammatory drugs [NSAID], initial encounter: Secondary | ICD-10-CM | POA: Diagnosis present

## 2014-12-18 DIAGNOSIS — G8929 Other chronic pain: Secondary | ICD-10-CM | POA: Diagnosis present

## 2014-12-18 DIAGNOSIS — M25511 Pain in right shoulder: Secondary | ICD-10-CM | POA: Diagnosis present

## 2014-12-18 DIAGNOSIS — R339 Retention of urine, unspecified: Secondary | ICD-10-CM | POA: Diagnosis present

## 2014-12-18 DIAGNOSIS — E039 Hypothyroidism, unspecified: Secondary | ICD-10-CM | POA: Diagnosis not present

## 2014-12-18 DIAGNOSIS — R531 Weakness: Secondary | ICD-10-CM

## 2014-12-18 HISTORY — DX: Nonrheumatic mitral (valve) prolapse: I34.1

## 2014-12-18 LAB — COMPREHENSIVE METABOLIC PANEL
ALK PHOS: 50 U/L (ref 39–117)
ALT: 23 U/L (ref 0–35)
ANION GAP: 13 (ref 5–15)
AST: 26 U/L (ref 0–37)
Albumin: 3.8 g/dL (ref 3.5–5.2)
BUN: 31 mg/dL — ABNORMAL HIGH (ref 6–23)
CO2: 23 mmol/L (ref 19–32)
CREATININE: 0.57 mg/dL (ref 0.50–1.10)
Calcium: 8.6 mg/dL (ref 8.4–10.5)
Chloride: 101 mmol/L (ref 96–112)
GFR calc non Af Amer: 82 mL/min — ABNORMAL LOW (ref >90)
GLUCOSE: 172 mg/dL — AB (ref 70–99)
Potassium: 3.5 mmol/L (ref 3.5–5.1)
Sodium: 137 mmol/L (ref 135–145)
Total Bilirubin: 1 mg/dL (ref 0.3–1.2)
Total Protein: 7.1 g/dL (ref 6.0–8.3)

## 2014-12-18 LAB — CBC WITH DIFFERENTIAL/PLATELET
BASOS PCT: 0 % (ref 0–1)
Basophils Absolute: 0 10*3/uL (ref 0.0–0.1)
EOS ABS: 0 10*3/uL (ref 0.0–0.7)
Eosinophils Relative: 0 % (ref 0–5)
HCT: 32.1 % — ABNORMAL LOW (ref 36.0–46.0)
HEMOGLOBIN: 11.1 g/dL — AB (ref 12.0–15.0)
Lymphocytes Relative: 10 % — ABNORMAL LOW (ref 12–46)
Lymphs Abs: 0.5 10*3/uL — ABNORMAL LOW (ref 0.7–4.0)
MCH: 32.3 pg (ref 26.0–34.0)
MCHC: 34.6 g/dL (ref 30.0–36.0)
MCV: 93.3 fL (ref 78.0–100.0)
Monocytes Absolute: 0.2 10*3/uL (ref 0.1–1.0)
Monocytes Relative: 5 % (ref 3–12)
NEUTROS ABS: 4 10*3/uL (ref 1.7–7.7)
NEUTROS PCT: 85 % — AB (ref 43–77)
Platelets: 194 10*3/uL (ref 150–400)
RBC: 3.44 MIL/uL — AB (ref 3.87–5.11)
RDW: 13 % (ref 11.5–15.5)
WBC: 4.7 10*3/uL (ref 4.0–10.5)

## 2014-12-18 LAB — POC OCCULT BLOOD, ED: FECAL OCCULT BLD: POSITIVE — AB

## 2014-12-18 LAB — PROTIME-INR
INR: 1.12 (ref 0.00–1.49)
PROTHROMBIN TIME: 14.5 s (ref 11.6–15.2)

## 2014-12-18 LAB — TROPONIN I

## 2014-12-18 MED ORDER — SODIUM CHLORIDE 0.9 % IV SOLN
80.0000 mg | Freq: Once | INTRAVENOUS | Status: AC
  Administered 2014-12-18: 80 mg via INTRAVENOUS
  Filled 2014-12-18: qty 80

## 2014-12-18 MED ORDER — SODIUM CHLORIDE 0.9 % IV BOLUS (SEPSIS)
1000.0000 mL | Freq: Once | INTRAVENOUS | Status: AC
  Administered 2014-12-18: 1000 mL via INTRAVENOUS

## 2014-12-18 NOTE — ED Notes (Addendum)
Patient states she began feeling bad @ 1 week ago. Was seen at  [place] and sent here for further evaluation. Dx at [facility] were 1) generalized abd pain, 2) AMS, 3)dehydration, 4)unspecified intestinal obstruction, 5)essential hypertension. Papers from [place] with patient. UC advised to charge nurse Lattie Haw patient may have bowel obstruction. Patient is difficult to obtain information from, individual who brought patient is in not available for questioning at this time.

## 2014-12-18 NOTE — ED Provider Notes (Signed)
CSN: [id number]     Arrival date & time 12/18/14  2119 History   First MD Initiated Contact with Patient 12/18/14 2147     Chief Complaint  Patient presents with  . Abdominal Pain    with tarry stools     (Consider location/radiation/quality/duration/timing/severity/associated sxs/prior Treatment) Patient is a 81 y.o. female presenting with diarrhea.  Diarrhea Quality:  Black and tarry Severity:  Severe Onset quality:  Unable to specify Duration: 1 week of symptoms, initially constipation,then diarrhea. Timing:  Intermittent Progression:  Worsening Relieved by:  Nothing Worsened by:  Nothing tried Associated symptoms: vomiting   Associated symptoms comment:  Confusion   Past Medical History  Diagnosis Date  . Cancer   . Breast cancer, left breast   . Osteoporosis   . Thyroid disease   . Hypertension    Past Surgical History  Procedure Laterality Date  . Breast lumpectomy    . Hemrrhoidectomy    . Saline implant after left mastectomy     Family History  Problem Relation Age of Onset  . Cancer Sister     breast    History  Substance Use Topics  . Smoking status: Never Smoker   . Smokeless tobacco: Not on file  . Alcohol Use: No   OB History    No data available     Review of Systems  Unable to perform ROS: Mental status change  Gastrointestinal: Positive for vomiting and diarrhea.      Allergies  Latex and Ultram  Home Medications   Prior to Admission medications   Medication Sig Start Date End Date Taking? Authorizing Provider  amoxicillin (AMOXIL) 500 MG tablet Take 500 mg by mouth as needed.    Historical Provider, MD  ARMOUR THYROID 60 MG tablet  07/21/14   Historical Provider, MD  cholecalciferol (VITAMIN D) 1000 UNITS tablet Take 5,000 Units by mouth 3 (three) times a week.    Historical Provider, MD  Magnesium 500 MG CAPS Take 1,000 mg by mouth daily.    Historical Provider, MD  Polyethyl Glycol-Propyl Glycol (SYSTANE PRESERVATIVE FREE)  0.4-0.3 % SOLN Apply to eye.    Historical Provider, MD  RESTASIS 0.05 % ophthalmic emulsion  10/17/13   Historical Provider, MD  UNABLE TO FIND Take by mouth daily. [place] ACID LIQUID    Historical Provider, MD   BP 185/102 mmHg  Pulse 102  Temp(Src) 97.6 F (36.4 C) (Oral)  Resp 16  SpO2 98% Physical Exam  Constitutional: She is oriented to person, place, and time. She appears well-developed and well-nourished. No distress.  HENT:  Head: Normocephalic and atraumatic.  Mouth/Throat: Oropharynx is clear and moist.  Eyes: Conjunctivae are normal. Pupils are equal, round, and reactive to light. No scleral icterus.  Neck: Neck supple.  Cardiovascular: Normal rate, regular rhythm, normal heart sounds and intact distal pulses.   No murmur heard. Pulmonary/Chest: Effort normal and breath sounds normal. No stridor. No respiratory distress. She has no rales.  Abdominal: Soft. Bowel sounds are normal. She exhibits no distension. There is no tenderness.  Genitourinary: Guaiac positive stool (black, melanic stool).  Musculoskeletal: Normal range of motion.  Neurological: She is alert and oriented to person, place, and time.  Alert, oriented, but unable to answer more complex questions due to confusion.   Moves all extremities with normal strength  Skin: Skin is warm and dry. No rash noted.  Varicose veins prominent in BLE. Mild petechial rash on left ankle.  Psychiatric: She has a normal mood  and affect. Her behavior is normal.  Nursing note and vitals reviewed.   ED Course  Procedures (including critical care time) Labs Review Labs Reviewed  CBC WITH DIFFERENTIAL/PLATELET - Abnormal; Notable for the following:    RBC 3.44 (*)    Hemoglobin 11.1 (*)    HCT 32.1 (*)    Neutrophils Relative % 85 (*)    Lymphocytes Relative 10 (*)    Lymphs Abs 0.5 (*)    All other components within normal limits  COMPREHENSIVE METABOLIC PANEL - Abnormal; Notable for the following:    Glucose, Bld 172  (*)    BUN 31 (*)    GFR calc non Af Amer 82 (*)    All other components within normal limits  POC OCCULT BLOOD, ED - Abnormal; Notable for the following:    Fecal Occult Bld POSITIVE (*)    All other components within normal limits  TROPONIN I  PROTIME-INR  URINALYSIS, ROUTINE W REFLEX MICROSCOPIC  POC OCCULT BLOOD, ED  TYPE AND SCREEN    Imaging Review No results found.   EKG Interpretation   Date/Time:  Monday December 18 2014 21:48:46 EDT Ventricular Rate:  100 PR Interval:  205 QRS Duration: 85 QT Interval:  374 QTC Calculation: 482 R Axis:   40 Text Interpretation:  Sinus tachycardia Probable left atrial enlargement  LVH with secondary repolarization abnormality Anterior Q waves, possibly  due to LVH Baseline wander in lead(s) V4 similar to prior Confirmed by  [facility]  MD, TREY ([id number]) on 12/18/2014 10:37:24 PM      MDM   Final diagnoses:  Gastrointestinal hemorrhage, unspecified gastritis, unspecified gastrointestinal hemorrhage type  Altered mental status, unspecified altered mental status type    81 yo female presenting with confusion, report of cramping abdominal pain, and melanic stool found on exam.    Labs show Hg of 11, unknown baseline.  Mild uremia with preserved kidney function.  Remains alert and oriented, but confused.  Will be admitted by Dr. Waldron Labs ([facility])  Serita Grit, MD 12/18/14 [phone]

## 2014-12-19 ENCOUNTER — Encounter (HOSPITAL_COMMUNITY): Payer: Self-pay

## 2014-12-19 ENCOUNTER — Encounter (HOSPITAL_COMMUNITY)
Admission: EM | Disposition: A | Discharge status: Skilled Nursing Facility | Payer: Self-pay | Source: Home / Self Care | Attending: Internal Medicine

## 2014-12-19 DIAGNOSIS — E039 Hypothyroidism, unspecified: Secondary | ICD-10-CM | POA: Diagnosis present

## 2014-12-19 DIAGNOSIS — R4182 Altered mental status, unspecified: Secondary | ICD-10-CM

## 2014-12-19 DIAGNOSIS — I4891 Unspecified atrial fibrillation: Secondary | ICD-10-CM | POA: Diagnosis not present

## 2014-12-19 DIAGNOSIS — K922 Gastrointestinal hemorrhage, unspecified: Secondary | ICD-10-CM | POA: Insufficient documentation

## 2014-12-19 DIAGNOSIS — I471 Supraventricular tachycardia: Secondary | ICD-10-CM

## 2014-12-19 DIAGNOSIS — R531 Weakness: Secondary | ICD-10-CM

## 2014-12-19 DIAGNOSIS — E876 Hypokalemia: Secondary | ICD-10-CM | POA: Diagnosis present

## 2014-12-19 HISTORY — PX: ESOPHAGOGASTRODUODENOSCOPY: SHX5428

## 2014-12-19 LAB — TYPE AND SCREEN
ABO/RH(D): A NEG
Antibody Screen: NEGATIVE

## 2014-12-19 LAB — COMPREHENSIVE METABOLIC PANEL
ALBUMIN: 3.4 g/dL — AB (ref 3.5–5.2)
ALK PHOS: 48 U/L (ref 39–117)
ALT: 20 U/L (ref 0–35)
AST: 22 U/L (ref 0–37)
Anion gap: 10 (ref 5–15)
BILIRUBIN TOTAL: 1 mg/dL (ref 0.3–1.2)
BUN: 20 mg/dL (ref 6–23)
CALCIUM: 7.9 mg/dL — AB (ref 8.4–10.5)
CHLORIDE: 106 mmol/L (ref 96–112)
CO2: 22 mmol/L (ref 19–32)
Creatinine, Ser: 0.42 mg/dL — ABNORMAL LOW (ref 0.50–1.10)
GFR calc Af Amer: >90 mL/min (ref >90)
GFR calc non Af Amer: >90 mL/min (ref >90)
Glucose, Bld: 108 mg/dL — ABNORMAL HIGH (ref 70–99)
Potassium: 3 mmol/L — ABNORMAL LOW (ref 3.5–5.1)
Sodium: 138 mmol/L (ref 135–145)
Total Protein: 6.4 g/dL (ref 6.0–8.3)

## 2014-12-19 LAB — CBC
HEMATOCRIT: 30.2 % — AB (ref 36.0–46.0)
Hemoglobin: 10.5 g/dL — ABNORMAL LOW (ref 12.0–15.0)
MCH: 32.2 pg (ref 26.0–34.0)
MCHC: 34.8 g/dL (ref 30.0–36.0)
MCV: 92.6 fL (ref 78.0–100.0)
Platelets: 207 10*3/uL (ref 150–400)
RBC: 3.26 MIL/uL — AB (ref 3.87–5.11)
RDW: 12.9 % (ref 11.5–15.5)
WBC: 4.9 10*3/uL (ref 4.0–10.5)

## 2014-12-19 LAB — HEMOGLOBIN AND HEMATOCRIT, BLOOD
HCT: 35.8 % — ABNORMAL LOW (ref 36.0–46.0)
HEMATOCRIT: 34.8 % — AB (ref 36.0–46.0)
HEMATOCRIT: 34.4 % — AB (ref 36.0–46.0)
HEMOGLOBIN: 12.4 g/dL (ref 12.0–15.0)
HEMOGLOBIN: 12 g/dL (ref 12.0–15.0)
Hemoglobin: 11.8 g/dL — ABNORMAL LOW (ref 12.0–15.0)

## 2014-12-19 LAB — TSH: TSH: 1.382 u[IU]/mL (ref 0.350–4.500)

## 2014-12-19 LAB — URINALYSIS, ROUTINE W REFLEX MICROSCOPIC
Bilirubin Urine: NEGATIVE
Glucose, UA: 100 mg/dL — AB
HGB URINE DIPSTICK: NEGATIVE
KETONES UR: 40 mg/dL — AB
LEUKOCYTES UA: NEGATIVE
Nitrite: NEGATIVE
PH: 7.5 (ref 5.0–8.0)
Protein, ur: NEGATIVE mg/dL
Specific Gravity, Urine: 1.01 (ref 1.005–1.030)
UROBILINOGEN UA: 0.2 mg/dL (ref 0.0–1.0)

## 2014-12-19 LAB — ABO/RH: ABO/RH(D): A NEG

## 2014-12-19 LAB — T4, FREE: FREE T4: 1.1 ng/dL (ref 0.80–1.80)

## 2014-12-19 SURGERY — EGD (ESOPHAGOGASTRODUODENOSCOPY)
Anesthesia: Moderate Sedation

## 2014-12-19 MED ORDER — SALINE SPRAY 0.65 % NA SOLN
1.0000 | NASAL | Status: DC | PRN
  Filled 2014-12-19: qty 44

## 2014-12-19 MED ORDER — FENTANYL CITRATE 0.05 MG/ML IJ SOLN
INTRAMUSCULAR | Status: DC | PRN
  Administered 2014-12-19: 12.5 ug via INTRAVENOUS

## 2014-12-19 MED ORDER — METOPROLOL TARTRATE 25 MG PO TABS
12.5000 mg | ORAL_TABLET | Freq: Two times a day (BID) | ORAL | Status: DC
  Administered 2014-12-19: 12.5 mg via ORAL
  Filled 2014-12-19: qty 1

## 2014-12-19 MED ORDER — PANTOPRAZOLE SODIUM 40 MG IV SOLR: 80.0000 mg | Freq: Once | INTRAVENOUS | Status: DC

## 2014-12-19 MED ORDER — ACETAMINOPHEN 325 MG PO TABS: 650.0000 mg | ORAL_TABLET | Freq: Four times a day (QID) | ORAL | Status: DC | PRN

## 2014-12-19 MED ORDER — CYCLOSPORINE 0.05 % OP EMUL
1.0000 [drp] | Freq: Two times a day (BID) | OPHTHALMIC | Status: DC | PRN
  Filled 2014-12-19: qty 1

## 2014-12-19 MED ORDER — METOPROLOL TARTRATE 25 MG PO TABS
25.0000 mg | ORAL_TABLET | Freq: Two times a day (BID) | ORAL | Status: DC
  Administered 2014-12-19 – 2014-12-22 (×6): 25 mg via ORAL
  Filled 2014-12-19 (×6): qty 1

## 2014-12-19 MED ORDER — ONDANSETRON HCL 4 MG/2ML IJ SOLN
4.0000 mg | Freq: Four times a day (QID) | INTRAMUSCULAR | Status: DC | PRN
  Administered 2014-12-19 (×2): 4 mg via INTRAVENOUS
  Filled 2014-12-19 (×2): qty 2

## 2014-12-19 MED ORDER — POTASSIUM CHLORIDE CRYS ER 20 MEQ PO TBCR: 40.0000 meq | EXTENDED_RELEASE_TABLET | Freq: Two times a day (BID) | ORAL | Status: DC

## 2014-12-19 MED ORDER — METOPROLOL TARTRATE 25 MG PO TABS
25.0000 mg | ORAL_TABLET | Freq: Once | ORAL | Status: AC
  Administered 2014-12-19: 25 mg via ORAL
  Filled 2014-12-19: qty 1

## 2014-12-19 MED ORDER — POTASSIUM CHLORIDE 10 MEQ/100ML IV SOLN
10.0000 meq | INTRAVENOUS | Status: AC
  Administered 2014-12-19 (×2): 10 meq via INTRAVENOUS
  Filled 2014-12-19 (×2): qty 100

## 2014-12-19 MED ORDER — POTASSIUM CHLORIDE 10 MEQ/100ML IV SOLN
10.0000 meq | INTRAVENOUS | Status: AC
  Administered 2014-12-19 (×3): 10 meq via INTRAVENOUS
  Filled 2014-12-19 (×3): qty 100

## 2014-12-19 MED ORDER — BUTAMBEN-TETRACAINE-BENZOCAINE 2-2-14 % EX AERO
INHALATION_SPRAY | CUTANEOUS | Status: DC | PRN
Start: 2014-12-19 — End: 2014-12-19
  Administered 2014-12-19: 2 via TOPICAL

## 2014-12-19 MED ORDER — THYROID 60 MG PO TABS
60.0000 mg | ORAL_TABLET | Freq: Every day | ORAL | Status: DC
  Administered 2014-12-20 – 2014-12-22 (×3): 60 mg via ORAL
  Filled 2014-12-19 (×4): qty 1

## 2014-12-19 MED ORDER — PROMETHAZINE HCL 25 MG/ML IJ SOLN: 12.5000 mg | Freq: Once | INTRAMUSCULAR | Status: AC | PRN

## 2014-12-19 MED ORDER — MAGNESIUM OXIDE 400 (241.3 MG) MG PO TABS
800.0000 mg | ORAL_TABLET | Freq: Every day | ORAL | Status: DC
  Administered 2014-12-20 – 2014-12-22 (×3): 800 mg via ORAL
  Filled 2014-12-19 (×4): qty 2

## 2014-12-19 MED ORDER — PANTOPRAZOLE SODIUM 40 MG IV SOLR
8.0000 mg/h | INTRAVENOUS | Status: DC
  Administered 2014-12-19 (×3): 8 mg/h via INTRAVENOUS
  Filled 2014-12-19 (×7): qty 80

## 2014-12-19 MED ORDER — SODIUM CHLORIDE 0.9 % IV SOLN
INTRAVENOUS | Status: DC
  Administered 2014-12-19 – 2014-12-20 (×3): via INTRAVENOUS

## 2014-12-19 MED ORDER — PANTOPRAZOLE SODIUM 40 MG IV SOLR: 40.0000 mg | Freq: Two times a day (BID) | INTRAVENOUS | Status: DC

## 2014-12-19 MED ORDER — POTASSIUM CHLORIDE CRYS ER 20 MEQ PO TBCR
40.0000 meq | EXTENDED_RELEASE_TABLET | Freq: Once | ORAL | Status: DC
  Filled 2014-12-19: qty 2

## 2014-12-19 MED ORDER — ONDANSETRON HCL 4 MG PO TABS: 4.0000 mg | ORAL_TABLET | Freq: Four times a day (QID) | ORAL | Status: DC | PRN

## 2014-12-19 MED ORDER — MIDAZOLAM HCL 10 MG/2ML IJ SOLN
INTRAMUSCULAR | Status: DC | PRN
  Administered 2014-12-19: 1 mg via INTRAVENOUS

## 2014-12-19 MED ORDER — VITAMINS A & D EX OINT
TOPICAL_OINTMENT | CUTANEOUS | Status: AC
Start: 2014-12-19 — End: 2014-12-19
  Administered 2014-12-19: 03:00:00
  Filled 2014-12-19: qty 5

## 2014-12-19 MED ORDER — METOPROLOL TARTRATE 1 MG/ML IV SOLN
2.5000 mg | INTRAVENOUS | Status: DC | PRN
  Filled 2014-12-19: qty 5

## 2014-12-19 MED ORDER — MIDAZOLAM HCL 10 MG/2ML IJ SOLN
INTRAMUSCULAR | Status: AC
  Filled 2014-12-19: qty 2

## 2014-12-19 MED ORDER — SODIUM CHLORIDE 0.9 % IV SOLN: INTRAVENOUS | Status: DC

## 2014-12-19 MED ORDER — METOPROLOL TARTRATE 1 MG/ML IV SOLN: 2.5000 mg | INTRAVENOUS | Status: DC | PRN

## 2014-12-19 MED ORDER — ACETAMINOPHEN 650 MG RE SUPP: 650.0000 mg | Freq: Four times a day (QID) | RECTAL | Status: DC | PRN

## 2014-12-19 MED ORDER — FENTANYL CITRATE 0.05 MG/ML IJ SOLN
INTRAMUSCULAR | Status: AC
  Filled 2014-12-19: qty 2

## 2014-12-19 MED ORDER — HYDRALAZINE HCL 20 MG/ML IJ SOLN
5.0000 mg | INTRAMUSCULAR | Status: DC | PRN
  Administered 2014-12-20: 5 mg via INTRAVENOUS
  Filled 2014-12-19 (×2): qty 1

## 2014-12-19 MED ORDER — HYDROCODONE-ACETAMINOPHEN 5-325 MG PO TABS
1.0000 | ORAL_TABLET | ORAL | Status: DC | PRN
  Administered 2014-12-19: 2 via ORAL
  Filled 2014-12-19 (×2): qty 2

## 2014-12-19 NOTE — Op Note (Signed)
[facility] [place], [address]   ENDOSCOPY PROCEDURE REPORT  PATIENT: [name], [name]  MR#: [id number] BIRTHDATE: 11-Apr-1927 , 77  yrs. old GENDER: female ENDOSCOPIST: Teena Irani, MD REFERRED BY: PROCEDURE DATE:  12-24-2014 PROCEDURE: ASA CLASS: INDICATIONS:  melena MEDICATIONS: 25 mg fentanyl 12 mg versed TOPICAL ANESTHETIC:  DESCRIPTION OF PROCEDURE: After the risks benefits and alternatives of the procedure were thoroughly explained, informed consent was obtained.  The Pentax Gastroscope [id number] endoscope was introduced through the mouth and advanced to the second portion of the duodenum , Without limitations.  The instrument was slowly withdrawn as the mucosa was fully examined.    esophagus:small hiatal hernia with minimal lower esophageal ring. No ulcer or esophagitis Stomach : Long and J-shaped    with some mild antral gastritis mainly manifest as granularity and nodularity with no stigmata of hemorrhage. Pylorus was patent  duodenum was normal          The scope was then withdrawn from the patient and the procedure completed.  COMPLICATIONS: There were no immediate complications.  ENDOSCOPIC IMPRESSION: small hiatal hernia,, mild antral gastritis.  RECOMMENDATIONS: treat with proton pump inhibitor, observe stools and hemoglobin. Patient has already refused colonoscopy.  REPEAT EXAM:  eSigned:  Teena Irani, MD 12/24/14 2:08 PM    CC:  CPT CODES: ICD CODES:  The ICD and CPT codes recommended by this software are interpretations from the data that the clinical staff has captured with the software.  The verification of the translation of this report to the ICD and CPT codes and modifiers is the sole responsibility of the health care institution and practicing physician where this report was generated.  [facility] will not be held responsible for the validity of the ICD and CPT codes included on this  report.  AMA assumes no liability for data contained or not contained herein. CPT is a Designer, television/film set of the [employer].

## 2014-12-19 NOTE — Progress Notes (Signed)
Report received from Erline Hau, RN. No change from initial am assessment. Will continue to monitor and follow POC.

## 2014-12-19 NOTE — Care Management Note (Signed)
CARE MANAGEMENT NOTE 12/19/2014  Patient:  [name], [name]   Account Number:  [id number]  Date Initiated:  12/19/2014  Documentation initiated by:  DAVIS,RHONDA  Subjective/Objective Assessment:   gi pain with ams     Action/Plan:   home when stable   Anticipated DC Date:  12/22/2014   Anticipated DC Plan:  HOME/SELF CARE  In-house referral  NA      DC Planning Services  CM consult      Choice offered to / List presented to:             Status of service:  In process, will continue to follow Medicare Important Message given?   (If response is "NO", the following Medicare IM given date fields will be blank) Date Medicare IM given:   Medicare IM given by:   Date Additional Medicare IM given:   Additional Medicare IM given by:    Discharge Disposition:    Per UR Regulation:  Reviewed for med. necessity/level of care/duration of stay  If discussed at [place] of Stay Meetings, dates discussed:    Comments:  December 19, 2014/Rhonda L. Rosana Hoes, RN, BSN, CCM. Case Management [place] [phone] No discharge needs present of time of review.

## 2014-12-19 NOTE — Plan of Care (Signed)
Problem: Phase I Progression Outcomes Goal: Voiding-avoid urinary catheter unless indicated Outcome: Not Met (add Reason) Foley inserted sec acute urinary retention- 12/19/14

## 2014-12-19 NOTE — Progress Notes (Signed)
EKG showed SVT with rate of 152, BP 142/86, no complaints.  Dr Renne Crigler notified, present at bedside. Will continue to monitor,.

## 2014-12-19 NOTE — Progress Notes (Addendum)
Patient seen and examined this morning, admitted overnight with GI bleed, for full details please refer to H&P.   Seen and examined this morning, found to be in SVT on my initial evaluation with heart rate ~150-160, regular. She responded to vagal maneuvers with improvement in her HR to 100s. She had recurrence of her tachycardia but on monitor looked more consistent with A fib w/ RVR. Blood pressure has been good throughout. Stared Metoprolol 12.5 BID. Patient later examined and is back to sinus rhythm with rates 90-100s.  - will obain 2D echo - cardiology consult - replete potassium. - she has been maintaining sinus rhythm this morning after initial Metoprolol, if remain in sinus should be OK for EGD later today.  Dr. Oletta Lamas has been consulted for her GI bleed.    Costin M. Cruzita Lederer, MD [facility] [phone]

## 2014-12-19 NOTE — H&P (Addendum)
Patient Demographics  [name], is a 81 y.o. female  MRN: [id number]   DOB - [birth year]  Admit Date - 12/18/2014  Outpatient Primary MD for the patient is MCNEILL,WENDY, MD   With History of -  Past Medical History  Diagnosis Date  . Cancer   . Breast cancer, left breast   . Osteoporosis   . Thyroid disease   . Hypertension       Past Surgical History  Procedure Laterality Date  . Breast lumpectomy    . Hemrrhoidectomy    . Saline implant after left mastectomy      in for   Chief Complaint  Patient presents with  . Abdominal Pain    with tarry stools     HPI  [name]  is a 81 y.o. female, history of osteoarthritis, hypothyroidism, osteoporosis, chronic right shoulder pain presents with generalized weakness, presents initially to urgent care, will send her to tell him to ED, patient lives home by herself, ambulates with a cane at baseline, no family members here, has sisters living in [place], rely on neighbors and friends for support, patient is mildly confused, patient friend at bedside helping with the history, report patient has been feeling weak for the last week, has been complaining of mild abdominal pain this afternoon, in ED patient was noticed to have melena, and was Hemoccult positive, hemoglobin is 11.9, no recent baseline, patient reports she has been using Aleve recently, denies any coffee-ground emesis, any bright red blood per rectum, reports constipation and diarrhea in the same time, HAS chronic problem with constipation, been followed by Dr. Paulita Fujita as an outpatient, most recent colonoscopy in 1987, has been declining to have repeat colonoscopy since.    Review of Systems    In addition to the HPI above,  No Fever-chills, No Headache, No changes with Vision or hearing, No problems swallowing food or Liquids, No Chest pain, Cough or Shortness of Breath, Reports of Abdominal pain, No Nausea or Vommitting, having both diarrhea and  constipation (obstipation is more of a chronic problem). No Blood in stool or Urine, No dysuria, No new skin rashes or bruises, No new joints pains-aches,  No new weakness, tingling, numbness in any extremity, No recent weight gain or loss, No polyuria, polydypsia or polyphagia, No significant Mental Stressors.  A full 10 point Review of Systems was done, except as stated above, all other Review of Systems were negative.   Social History History  Substance Use Topics  . Smoking status: Never Smoker   . Smokeless tobacco: Not on file  . Alcohol Use: No     Family History Family History  Problem Relation Age of Onset  . Cancer Sister     breast      Prior to Admission medications   Medication Sig Start Date End Date Taking? Authorizing Provider  amoxicillin (AMOXIL) 500 MG tablet Take 500 mg by mouth daily as needed.     Historical Provider, MD  ARMOUR THYROID 60 MG tablet Take 60 mg by mouth daily before breakfast.  07/21/14  Yes Historical Provider, MD  cholecalciferol (VITAMIN D) 1000 UNITS tablet Take 5,000 Units by mouth 3 (three) times a week.   Yes Historical Provider, MD  Magnesium 500 MG CAPS Take 1,000 mg by mouth daily.    Historical Provider, MD  Polyethyl Glycol-Propyl Glycol (SYSTANE PRESERVATIVE FREE) 0.4-0.3 % SOLN Apply to eye.    Historical Provider, MD  RESTASIS 0.05 % ophthalmic emulsion Place 1  drop into both eyes 2 (two) times daily as needed.  10/17/13   Historical Provider, MD  UNABLE TO FIND Take by mouth daily. [facility] ACID LIQUID    Historical Provider, MD    Allergies  Allergen Reactions  . Latex     Skin rash with bandaids and tapes  . Ultram [Tramadol Hcl] Nausea Only    Physical Exam  Vitals  Blood pressure 174/104, pulse 102, temperature 97.6 F (36.4 C), temperature source Oral, resp. rate 18, SpO2 98 %.   1. General frail, ill-appearing elderly female lying in bed in NAD,    2. Awake Alert, Oriented X 3, but still appears to be  confused.  3. No F.N deficits, ALL C.Nerves Intact, Strength 5/5 all 4 extremities, Sensation intact all 4 extremities, Plantars down going.  4. Ears and Eyes appear Normal, Conjunctivae clear, PERRLA. Moist Oral Mucosa.  5. Supple Neck, No JVD, No cervical lymphadenopathy appriciated, No Carotid Bruits.  6. Symmetrical Chest wall movement, Good air movement bilaterally, CTAB.  7. RRR, No Gallops, Rubs or Murmurs, No Parasternal Heave.  8. Positive Bowel Sounds, Abdomen Soft, No tenderness, No organomegaly appriciated,No rebound -guarding or rigidity.  9.  No Cyanosis, delyed  Skin Turgor, No Skin Rash or Bruise. Chronic varicose vein in lower extremities,lower abd nevus ( report its been stable with no chages  As long she remembers it )  10. Good muscle tone,  joints appear normal , no effusions, Normal ROM.    Data Review  CBC  Recent Labs Lab 12/18/14 2205  WBC 4.7  HGB 11.1*  HCT 32.1*  PLT 194  MCV 93.3  MCH 32.3  MCHC 34.6  RDW 13.0  LYMPHSABS 0.5*  MONOABS 0.2  EOSABS 0.0  BASOSABS 0.0   ------------------------------------------------------------------------------------------------------------------  Chemistries   Recent Labs Lab 12/18/14 2205  NA 137  K 3.5  CL 101  CO2 23  GLUCOSE 172*  BUN 31*  CREATININE 0.57  CALCIUM 8.6  AST 26  ALT 23  ALKPHOS 50  BILITOT 1.0   ------------------------------------------------------------------------------------------------------------------ CrCl cannot be calculated (Unknown ideal weight.). ------------------------------------------------------------------------------------------------------------------ No results for input(s): TSH, T4TOTAL, T3FREE, THYROIDAB in the last 72 hours.  Invalid input(s): FREET3   Coagulation profile  Recent Labs Lab 12/18/14 2205  INR 1.12   ------------------------------------------------------------------------------------------------------------------- No  results for input(s): DDIMER in the last 72 hours. -------------------------------------------------------------------------------------------------------------------  Cardiac Enzymes  Recent Labs Lab 12/18/14 2205  TROPONINI <0.03   ------------------------------------------------------------------------------------------------------------------ Invalid input(s): POCBNP   ---------------------------------------------------------------------------------------------------------------  Urinalysis    Component Value Date/Time   COLORURINE YELLOW 12/16/2011 2316   APPEARANCEUR CLOUDY* 12/16/2011 2316   LABSPEC 1.016 12/16/2011 2316   PHURINE 7.0 12/16/2011 2316   GLUCOSEU NEGATIVE 12/16/2011 2316   HGBUR NEGATIVE 12/16/2011 2316   BILIRUBINUR NEGATIVE 12/16/2011 2316   BILIRUBINUR neg 12/05/2011 1147   KETONESUR TRACE* 12/16/2011 2316   PROTEINUR NEGATIVE 12/16/2011 2316   PROTEINUR 30 12/05/2011 1147   UROBILINOGEN 1.0 12/16/2011 2316   UROBILINOGEN 0.2 12/05/2011 1147   NITRITE POSITIVE* 12/16/2011 2316   NITRITE neg 12/05/2011 1147   LEUKOCYTESUR MODERATE* 12/16/2011 2316    ----------------------------------------------------------------------------------------------------------------  Imaging results:   No results found.  My personal review of EKG: Rhythm NSR, Rate  100 /min, QTc 482 , no Acute ST changes    Assessment & Plan  Principal Problem:   GI bleeding Active Problems:   Vitamin D deficiency   Weakness generalized   Hypothyroid    GI bleeding - Patient presents with melena, Hemoccult  positive stool, reports inside use recently, most likely related to upper GI bleed especially with  elevated BUN . - Even though hemoglobin is 11.9, I would anticipate significant drop after appropriate hydration, as patient appears to be clinically dehydrated. - We'll start on Protonix drip, hemoglobin every 8 hours, keep nothing by mouth. -Will have morning team  consult gastroenterology. - SCDs for DVT prophylaxis.  Weakness - Patient appears to be clinically dehydrated, as well most likely with GI bleed. - We'll hydrate appropriately, will check urinalysis. - When she is stable we'll consult PT  Hypertension - Blood pressure is significantly elevated in ED, patient does not have a diagnosis of hypertension, keep her on when necessary IV hydralazine.  Hypothyroidism - Continue with Armour thyroid, check TSH and free T4.  Vitamin D deficiency - resumeSupplements when more stable.  Urinary retention - had 750 cc post void, will insert foley catheter.   DVT Prophylaxis  SCDs   AM Labs Ordered, also please review Full Orders  Family Communication: Admission, patients condition and plan of care including tests being ordered have been discussed with the patient and her sister AnnCurry [phone]) who indicate understanding and agree with the plan and Code Status.  Code Status DO NOT RESUSCITATE confirmed by her sister who is her healthcare power of attorney, and has her advanced directive  Likely DC to  pending further workup  Condition GUARDED    Time spent in minutes : 60 minutes    ELGERGAWY, DAWOOD M.D on 12/19/2014 at 12:20 AM  Between 7am to 7pm - Pager - [phone]  After 7pm go to [email or web address] - password [email or web address]  And look for the night coverage person covering me after hours  [facility] Office  [phone]   **Disclaimer: This note may have been dictated with voice recognition software. Similar sounding words can inadvertently be transcribed and this note may contain transcription errors which may not have been corrected upon publication of note.**

## 2014-12-19 NOTE — Consult Note (Signed)
CARDIOLOGY CONSULT NOTE   Patient ID: [name] MRN: [id number] DOB/AGE: 05/25/1927 81 y.o.  Admit date: 12/18/2014  Primary Physician   Erika Caraway, MD Primary Cardiologist   New Reason for Consultation   SVT  HUD:JSHFWY [name] is a 81 y.o. year old female with a history of HTN, hypothyroid, vascular insufficiency and who prefers alternative medicine, who was admitted 03/29 w/ melena and anemia, Hgb 11.1. EGD performed showing mild antral gastritis.   In the ER, she was seen by [name] and was in ?SVT vs atrial fib, w/ HR > 150. HR improved to 100s w/ vagal maneuvers. Started on metoprolol and was in SR when she went for the EGD.   [name] has no history of chest pain or palpitations. She is aware of her MVP but has not been evaluated for this. She has recently noticed fatigue and this has gotten worse recently.   Her major problem was that her constipation medications quit working, then she developed melena, this leading to her admission.  She denies any history of DOE, presyncope or syncope. She has fallen several times, but cannot say why. She has fallen at least once in 2016, no ER visits for this.   She is still possibly suffering from effects of sedation given for the EGD, but also states that she has been struggling recently to maintain herself. She is alert and oriented 3, but had generalized weakness on admission.   Past Medical History  Diagnosis Date  . Cancer   . Breast cancer, left breast   . Osteoporosis   . Thyroid disease   . Hypertension   . MVP (mitral valve prolapse)      Past Surgical History  Procedure Laterality Date  . Breast lumpectomy    . Hemrrhoidectomy    . Saline implant after left mastectomy      Allergies  Allergen Reactions  . Ultram [Tramadol Hcl] Nausea Only  . Latex Rash    Skin rash with bandaids and tapes    I have reviewed the patient's current medications . magnesium oxide  800 mg Oral Daily  . metoprolol  tartrate  12.5 mg Oral BID  . [START ON 12/22/2014] pantoprazole (PROTONIX) IV  40 mg Intravenous Q12H  . thyroid  60 mg Oral QAC breakfast   . sodium chloride 75 mL/hr at 12/19/14 0135  . sodium chloride    . pantoprozole (PROTONIX) infusion 8 mg/hr (12/19/14 1058)   acetaminophen **OR** acetaminophen, cycloSPORINE, hydrALAZINE, HYDROcodone-acetaminophen, metoprolol, ondansetron **OR** ondansetron (ZOFRAN) IV, promethazine, sodium chloride  Prior to Admission medications   Medication Sig Start Date End Date Taking? Authorizing Provider  amoxicillin (AMOXIL) 500 MG tablet Take 500 mg by mouth daily as needed.     Historical Provider, MD  ARMOUR THYROID 60 MG tablet Take 60 mg by mouth daily before breakfast.  07/21/14  Yes Historical Provider, MD  cholecalciferol (VITAMIN D) 1000 UNITS tablet Take 5,000 Units by mouth 3 (three) times a week.    Historical Provider, MD  Magnesium 500 MG CAPS Take 1,000 mg by mouth daily.    Historical Provider, MD  Polyethyl Glycol-Propyl Glycol (SYSTANE PRESERVATIVE FREE) 0.4-0.3 % SOLN Apply to eye.    Historical Provider, MD  RESTASIS 0.05 % ophthalmic emulsion Place 1 drop into both eyes 2 (two) times daily as needed.  10/17/13   Historical Provider, MD  UNABLE TO FIND Take by mouth daily. [place] ACID LIQUID    Historical Provider, MD  History   Social History  . Marital Status: Single    Spouse Name: N/A  . Number of Children: N/A  . Years of Education: N/A   Occupational History  . Retired    Social History Main Topics  . Smoking status: Never Smoker   . Smokeless tobacco: Not on file  . Alcohol Use: No  . Drug Use: No  . Sexual Activity: No   Other Topics Concern  . Not on file   Social History Narrative   She lives alone.    Family Status  Relation Status Death Age  . Mother Deceased 12    Heart  . Father Deceased 56    pancreatic cancer  . Sister Alive    Family History  Problem Relation Age of Onset  . Cancer Sister      breast      ROS:  Full 14 point review of systems complete and found to be negative unless listed above.  Physical Exam: Blood pressure 154/83, pulse 93, temperature 97.8 F (36.6 C), temperature source Oral, resp. rate 15, height 5\' 7"  (1.702 m), weight 119 lb 14.9 oz (54.4 kg), SpO2 98 %.  General: Well developed, well nourished, female in no acute distress Head: Eyes PERRLA, No xanthomas.   Normocephalic and atraumatic, oropharynx without edema or exudate. Dentition: good Lungs: clear bilaterally Heart: HRRR S1 S2, no rub/gallop, 2-3 + murmur. pulses are 2+ all 4 extrem.   Neck: No carotid bruits. No lymphadenopathy.  JVD not elevated. Abdomen: Bowel sounds present, abdomen soft and tender without masses or hernias noted. Msk:  No spine or cva tenderness. Generalized weakness, no joint deformities or effusions. Extremities: No clubbing or cyanosis. No edema.  Neuro: Alert and oriented X 3. No focal deficits noted. Psych:  Good affect, responds appropriately Skin: No rashes or lesions noted.  Labs:   Lab Results  Component Value Date   WBC 4.9 12/19/2014   HGB 12.4 12/19/2014   HCT 35.8* 12/19/2014   MCV 92.6 12/19/2014   PLT 207 12/19/2014    Recent Labs  12/18/14 2205  INR 1.12     Recent Labs Lab 12/19/14 0515  NA 138  K 3.0*  CL 106  CO2 22  BUN 20  CREATININE 0.42*  CALCIUM 7.9*  PROT 6.4  BILITOT 1.0  ALKPHOS 48  ALT 20  AST 22  GLUCOSE 108*  ALBUMIN 3.4*   No results found for: MG  Recent Labs  12/18/14 2205  TROPONINI <0.03   TSH  Date/Time Value Ref Range Status  12/19/2014 05:15 AM 1.382 0.350 - 4.500 uIU/mL Final   Echo: pending  ECG:  Initial ECG in ER was ST 03/28  - Initial ECG was clearly sinus rhythm but subsequent ECG was clearly SVT.  ASSESSMENT AND PLAN:   The patient was seen today by Erika. [place], the patient evaluated and the data reviewed.     SVT and paroxysmal atrial fibrillation with rapid ventricular response -  Initial ECG was clearly sinus rhythm - SVT seen on ECG at 8 am  - ECG at 9:30 AM today, is atrial fibrillation, rapid ventricular response - Currently she is in sinus rhythm - She does not remember any symptoms from the SVT or atrial fibrillation - BP has been stable and she has been started on a beta blocker but HR/BP still elevated, increase dose    MVP - echo ordered, f/u on results    Anticoagulation - This patients CHA2DS2-VASc Score and unadjusted  Ischemic Stroke Rate (% per year) is equal to 4.8 % stroke rate/year from a score of 4 but she is currently not a candidate for oral anticoagulation due to acute GIB. Would follow up as OP, but she may refuse it. Above score calculated as 1 point each if present [CHF, HTN, DM, Vascular=MI/PAD/Aortic Plaque, Age if 65-74, or Female] Above score calculated as 2 points each if present [Age > 75, or Stroke/TIA/TE]    Hypokalemia - Potassium has been repleted - Per IM  Principal Problem:   GI bleeding - EGD with small hiatal hernia, mild antral gastritis - Patient refuses colonoscopy - Erika. Amedeo Plenty recommends treating with proton pump inhibitor  Other problems present on admission: Active Problems:   Vitamin D deficiency   Weakness generalized   Hypothyroid - TSH and free T4 were okay   Altered mental status   Bleeding gastrointestinal   Signed: Rosaria Ferries, PA-C 12/19/2014 4:34 PM Beeper [phone]  Co-Sign MD Patient seen and examined and history reviewed. Agree with above findings and plan. 81 yo WF admitted with GIB and anemia. While here she has intermittent SVT and atrial fibrillation with RVR. She denies any symptoms. Now back in NSR. Echo is pending. Although she has a [place] Vasc score of 3 she is not a candidate for anticoagulant therapy due to GIB. Perhaps when this has resolved anticoagulation could be considered as an outpatient. Will start beta blocker for now. The patient is confused and makes it clear that she doesn't  believe in prescription medication so I think long term compliance will be an issue.   Peter Benson, [place] 12/19/2014 4:55 PM

## 2014-12-19 NOTE — Consult Note (Signed)
[name] CONSULT Reason for consult: G.I. bleeding Referring Physician: [facility]. PCP: Dr. Leonides Schanz. Primary G.I.: Dr. Sherlynn Stalls [name] is an 81 y.o. female.  HPI: she is admitted through the emergency room with tarry stools that were documented to be melenic and positive for blood. Her hemoglobin was 11.1. The patient has a long history of being against routine medical care and sees alternative medicine physicians. She sees Dr. Jaynie Collins on a regular basis he treats her problems with alternative therapy. She is unable to tell me exactly what she's taking. She has vascular insufficiency and has been to several different places most recently to [facility] is treating her vascular insufficiency. She had colonoscopy in 1987 states that it was the worst experience in her life and that she will never have another one no matter what. She has discussed this in the past with Dr. Paulita Fujita and told him that she will never do colonoscopy. She is chronically constipated and has been on various laxatives as well as Amitiza in the past as well as some alternative therapies which she is unable to tell me the names. About a week ago she had diarrhea cramping abdominal pain. She has had tarry stools for several days unable to tell me for how long. She also notes that she has been taking Aleve for various pains which he has obtained over-the-counter and is unable to tell me how long she has been taking. She states that she's never had ulcers before. She tells me that she does not trust doctors is not sure she wishes to have any test done. I asked her several times about abdominal pain and I'm not sure whether she's having abdominal pain or not. It is notable that she saw Dr. Jaynie Collins recently had a hemoglobin of 13.4 and those results were sent to Dr. Leonides Schanz. Her current hemoglobin is dropped to 11.1.  Past Medical History  Diagnosis Date  . Cancer   . Breast cancer, left  breast   . Osteoporosis   . Thyroid disease   . Hypertension     Past Surgical History  Procedure Laterality Date  . Breast lumpectomy    . Hemrrhoidectomy    . Saline implant after left mastectomy      Family History  Problem Relation Age of Onset  . Cancer Sister     breast     Social History:  reports that she has never smoked. She does not have any smokeless tobacco history on file. She reports that she does not drink alcohol or use illicit drugs.  Allergies:  Allergies  Allergen Reactions  . Latex     Skin rash with bandaids and tapes  . Ultram [Tramadol Hcl] Nausea Only    Medications; Prior to Admission medications   Medication Sig Start Date End Date Taking? Authorizing Provider  amoxicillin (AMOXIL) 500 MG tablet Take 500 mg by mouth daily as needed.     Historical Provider, MD  ARMOUR THYROID 60 MG tablet Take 60 mg by mouth daily before breakfast.  07/21/14  Yes Historical Provider, MD  cholecalciferol (VITAMIN D) 1000 UNITS tablet Take 5,000 Units by mouth 3 (three) times a week.   Yes Historical Provider, MD  Magnesium 500 MG CAPS Take 1,000 mg by mouth daily.    Historical Provider, MD  Polyethyl Glycol-Propyl Glycol (SYSTANE PRESERVATIVE FREE) 0.4-0.3 % SOLN Apply to eye.    Historical Provider, MD  RESTASIS 0.05 % ophthalmic emulsion Place  1 drop into both eyes 2 (two) times daily as needed.  10/17/13   Historical Provider, MD  UNABLE TO FIND Take by mouth daily. FULVIC ACID LIQUID    Historical Provider, MD   . magnesium oxide  800 mg Oral Daily  . metoprolol tartrate  12.5 mg Oral BID  . [START ON 12/22/2014] pantoprazole (PROTONIX) IV  40 mg Intravenous Q12H  . potassium chloride  10 mEq Intravenous Q1 Hr x 2  . potassium chloride  40 mEq Oral Once  . thyroid  60 mg Oral QAC breakfast   PRN Meds acetaminophen **OR** acetaminophen, cycloSPORINE, hydrALAZINE, HYDROcodone-acetaminophen, metoprolol, ondansetron **OR** ondansetron (ZOFRAN) IV, promethazine,  sodium chloride Results for orders placed or performed during the hospital encounter of 12/18/14 (from the past 48 hour(s))  Type and screen     Status: None   Collection Time: 12/18/14 10:00 PM  Result Value Ref Range   ABO/RH(D) A NEG    Antibody Screen NEG    Sample Expiration 12/21/2014   ABO/Rh     Status: None   Collection Time: 12/18/14 10:00 PM  Result Value Ref Range   ABO/RH(D) A NEG   POC occult blood, ED     Status: Abnormal   Collection Time: 12/18/14 10:02 PM  Result Value Ref Range   Fecal Occult Bld POSITIVE (A) NEGATIVE  CBC with Differential/Platelet     Status: Abnormal   Collection Time: 12/18/14 10:05 PM  Result Value Ref Range   WBC 4.7 4.0 - 10.5 K/uL   RBC 3.44 (L) 3.87 - 5.11 MIL/uL   Hemoglobin 11.1 (L) 12.0 - 15.0 g/dL   HCT 32.1 (L) 36.0 - 46.0 %   MCV 93.3 78.0 - 100.0 fL   MCH 32.3 26.0 - 34.0 pg   MCHC 34.6 30.0 - 36.0 g/dL   RDW 13.0 11.5 - 15.5 %   Platelets 194 150 - 400 K/uL   Neutrophils Relative % 85 (H) 43 - 77 %   Neutro Abs 4.0 1.7 - 7.7 K/uL   Lymphocytes Relative 10 (L) 12 - 46 %   Lymphs Abs 0.5 (L) 0.7 - 4.0 K/uL   Monocytes Relative 5 3 - 12 %   Monocytes Absolute 0.2 0.1 - 1.0 K/uL   Eosinophils Relative 0 0 - 5 %   Eosinophils Absolute 0.0 0.0 - 0.7 K/uL   Basophils Relative 0 0 - 1 %   Basophils Absolute 0.0 0.0 - 0.1 K/uL  Comprehensive metabolic panel     Status: Abnormal   Collection Time: 12/18/14 10:05 PM  Result Value Ref Range   Sodium 137 135 - 145 mmol/L   Potassium 3.5 3.5 - 5.1 mmol/L   Chloride 101 96 - 112 mmol/L   CO2 23 19 - 32 mmol/L   Glucose, Bld 172 (H) 70 - 99 mg/dL   BUN 31 (H) 6 - 23 mg/dL   Creatinine, Ser 0.57 0.50 - 1.10 mg/dL   Calcium 8.6 8.4 - 10.5 mg/dL   Total Protein 7.1 6.0 - 8.3 g/dL   Albumin 3.8 3.5 - 5.2 g/dL   AST 26 0 - 37 U/L   ALT 23 0 - 35 U/L   Alkaline Phosphatase 50 39 - 117 U/L   Total Bilirubin 1.0 0.3 - 1.2 mg/dL   GFR calc non Af Amer 82 (L) >90 mL/min   GFR calc Af  Amer >90 >90 mL/min    Comment: (NOTE) The eGFR has been calculated using the CKD EPI equation. This  calculation has not been validated in all clinical situations. eGFR's persistently <90 mL/min signify possible Chronic Kidney Disease.    Anion gap 13 5 - 15  Troponin I     Status: None   Collection Time: 12/18/14 10:05 PM  Result Value Ref Range   Troponin I <0.03 <0.031 ng/mL    Comment:        NO INDICATION OF MYOCARDIAL INJURY.   Protime-INR     Status: None   Collection Time: 12/18/14 10:05 PM  Result Value Ref Range   Prothrombin Time 14.5 11.6 - 15.2 seconds   INR 1.12 0.00 - 1.49  Urinalysis, Routine w reflex microscopic     Status: Abnormal   Collection Time: 12/18/14 11:55 PM  Result Value Ref Range   Color, Urine YELLOW YELLOW   APPearance CLEAR CLEAR   Specific Gravity, Urine 1.010 1.005 - 1.030   pH 7.5 5.0 - 8.0   Glucose, UA 100 (A) NEGATIVE mg/dL   Hgb urine dipstick NEGATIVE NEGATIVE   Bilirubin Urine NEGATIVE NEGATIVE   Ketones, ur 40 (A) NEGATIVE mg/dL   Protein, ur NEGATIVE NEGATIVE mg/dL   Urobilinogen, UA 0.2 0.0 - 1.0 mg/dL   Nitrite NEGATIVE NEGATIVE   Leukocytes, UA NEGATIVE NEGATIVE    Comment: MICROSCOPIC NOT DONE ON URINES WITH NEGATIVE PROTEIN, BLOOD, LEUKOCYTES, NITRITE, OR GLUCOSE <1000 mg/dL.  TSH     Status: None   Collection Time: 12/19/14  5:15 AM  Result Value Ref Range   TSH 1.382 0.350 - 4.500 uIU/mL  Comprehensive metabolic panel     Status: Abnormal   Collection Time: 12/19/14  5:15 AM  Result Value Ref Range   Sodium 138 135 - 145 mmol/L   Potassium 3.0 (L) 3.5 - 5.1 mmol/L   Chloride 106 96 - 112 mmol/L   CO2 22 19 - 32 mmol/L   Glucose, Bld 108 (H) 70 - 99 mg/dL   BUN 20 6 - 23 mg/dL    Comment: DELTA CHECK NOTED REPEATED TO VERIFY    Creatinine, Ser 0.42 (L) 0.50 - 1.10 mg/dL   Calcium 7.9 (L) 8.4 - 10.5 mg/dL   Total Protein 6.4 6.0 - 8.3 g/dL   Albumin 3.4 (L) 3.5 - 5.2 g/dL   AST 22 0 - 37 U/L   ALT 20 0 - 35 U/L    Alkaline Phosphatase 48 39 - 117 U/L   Total Bilirubin 1.0 0.3 - 1.2 mg/dL   GFR calc non Af Amer >90 >90 mL/min   GFR calc Af Amer >90 >90 mL/min    Comment: (NOTE) The eGFR has been calculated using the CKD EPI equation. This calculation has not been validated in all clinical situations. eGFR's persistently <90 mL/min signify possible Chronic Kidney Disease.    Anion gap 10 5 - 15  CBC     Status: Abnormal   Collection Time: 12/19/14  5:15 AM  Result Value Ref Range   WBC 4.9 4.0 - 10.5 K/uL   RBC 3.26 (L) 3.87 - 5.11 MIL/uL   Hemoglobin 10.5 (L) 12.0 - 15.0 g/dL   HCT 30.2 (L) 36.0 - 46.0 %   MCV 92.6 78.0 - 100.0 fL   MCH 32.2 26.0 - 34.0 pg   MCHC 34.8 30.0 - 36.0 g/dL   RDW 12.9 11.5 - 15.5 %   Platelets 207 150 - 400 K/uL    No results found.            Blood pressure 141/95, pulse 146,  temperature 98.4 F (36.9 C), temperature source Oral, resp. rate 20, height _0  (1.702 m), weight 54.4 kg (119 lb 14.9 oz), SpO2 100 %.  Physical exam:   General--elderly frail white female in no distress ENT-- nonicteric Neck-- no gross lymphadenopathy Heart-- regular rate and rhythm without murmurs are gallops Lungs--clear Abdomen-- nondistended soft and nontender with good bowel sounds Psych-- patient is oriented to person place and time. She does have some rambling speech and does appear to have difficulty focusing in on specific answers to specific questions   Assessment: 1. G.I. bleed. This most likely is an upper G.I. bleed due to Aleve. 2. Vascular insufficiency lower extremities  Plan: 1. Will proceed with EGD this afternoon by Dr. Amedeo Plenty. I have discussed this with the patient. The procedure has been explained including the risk. She is quite hesitant to have any procedures done by any doctors to her generalized distrust of doctors that states that she will go ahead with the procedure at this time. Consent will be obtained and she will be maintained NPO 2.  Agree with empiric PPI therapy   Cartha Rotert JR,Birdie Fetty L 12/19/2014, 9:24 AM

## 2014-12-20 ENCOUNTER — Encounter (HOSPITAL_COMMUNITY): Payer: Self-pay | Admitting: Gastroenterology

## 2014-12-20 DIAGNOSIS — I471 Supraventricular tachycardia: Secondary | ICD-10-CM

## 2014-12-20 DIAGNOSIS — K2901 Acute gastritis with bleeding: Secondary | ICD-10-CM

## 2014-12-20 LAB — BASIC METABOLIC PANEL
ANION GAP: 10 (ref 5–15)
Anion gap: 7 (ref 5–15)
Anion gap: 7 (ref 5–15)
BUN: 9 mg/dL (ref 6–23)
BUN: 13 mg/dL (ref 6–23)
BUN: 16 mg/dL (ref 6–23)
CALCIUM: 7.9 mg/dL — AB (ref 8.4–10.5)
CHLORIDE: 98 mmol/L (ref 96–112)
CHLORIDE: 98 mmol/L (ref 96–112)
CO2: 20 mmol/L (ref 19–32)
CO2: 22 mmol/L (ref 19–32)
CO2: 22 mmol/L (ref 19–32)
Calcium: 8.1 mg/dL — ABNORMAL LOW (ref 8.4–10.5)
Calcium: 7.9 mg/dL — ABNORMAL LOW (ref 8.4–10.5)
Chloride: 99 mmol/L (ref 96–112)
Creatinine, Ser: 0.5 mg/dL (ref 0.50–1.10)
Creatinine, Ser: 0.39 mg/dL — ABNORMAL LOW (ref 0.50–1.10)
Creatinine, Ser: 0.51 mg/dL (ref 0.50–1.10)
GFR calc Af Amer: >90 mL/min (ref >90)
GFR calc Af Amer: >90 mL/min (ref >90)
GFR calc non Af Amer: 85 mL/min — ABNORMAL LOW (ref >90)
GFR calc non Af Amer: 85 mL/min — ABNORMAL LOW (ref >90)
Glucose, Bld: 106 mg/dL — ABNORMAL HIGH (ref 70–99)
Glucose, Bld: 129 mg/dL — ABNORMAL HIGH (ref 70–99)
Glucose, Bld: 122 mg/dL — ABNORMAL HIGH (ref 70–99)
POTASSIUM: 4 mmol/L (ref 3.5–5.1)
POTASSIUM: 3.5 mmol/L (ref 3.5–5.1)
Potassium: 3.2 mmol/L — ABNORMAL LOW (ref 3.5–5.1)
SODIUM: 128 mmol/L — AB (ref 135–145)
SODIUM: 127 mmol/L — AB (ref 135–145)
SODIUM: 128 mmol/L — AB (ref 135–145)

## 2014-12-20 LAB — URINALYSIS, ROUTINE W REFLEX MICROSCOPIC
BILIRUBIN URINE: NEGATIVE
Glucose, UA: NEGATIVE mg/dL
Ketones, ur: NEGATIVE mg/dL
NITRITE: NEGATIVE
PH: 7 (ref 5.0–8.0)
Protein, ur: NEGATIVE mg/dL
Specific Gravity, Urine: 1.009 (ref 1.005–1.030)
UROBILINOGEN UA: 1 mg/dL (ref 0.0–1.0)

## 2014-12-20 LAB — CBC
HCT: 37.8 % (ref 36.0–46.0)
HEMOGLOBIN: 13.1 g/dL (ref 12.0–15.0)
MCH: 31.8 pg (ref 26.0–34.0)
MCHC: 34.7 g/dL (ref 30.0–36.0)
MCV: 91.7 fL (ref 78.0–100.0)
Platelets: 232 10*3/uL (ref 150–400)
RBC: 4.12 MIL/uL (ref 3.87–5.11)
RDW: 12.8 % (ref 11.5–15.5)
WBC: 14.1 10*3/uL — AB (ref 4.0–10.5)

## 2014-12-20 LAB — URINE MICROSCOPIC-ADD ON

## 2014-12-20 MED ORDER — PANTOPRAZOLE SODIUM 40 MG PO TBEC
40.0000 mg | DELAYED_RELEASE_TABLET | Freq: Every day | ORAL | Status: DC
  Administered 2014-12-20 – 2014-12-22 (×3): 40 mg via ORAL
  Filled 2014-12-20 (×3): qty 1

## 2014-12-20 MED ORDER — POTASSIUM CHLORIDE CRYS ER 20 MEQ PO TBCR
40.0000 meq | EXTENDED_RELEASE_TABLET | Freq: Once | ORAL | Status: AC
  Administered 2014-12-20: 40 meq via ORAL
  Filled 2014-12-20: qty 2

## 2014-12-20 NOTE — Progress Notes (Signed)
[name] Subjective Pt eating no real complaints. Seen by cards for AF/SVT and now in NSR. EGD showed some gastritis probably due to the Aleve she had been taking. Hg up  Objective: Vital signs in last 24 hours: Temp:  [97.5 F (36.4 C)-98.5 F (36.9 C)] 98.1 F (36.7 C) (03/30 0509) Pulse Rate:  [60-146] 87 (03/30 0509) Resp:  [13-23] 18 (03/30 0509) BP: (140-214)/(80-186) 140/80 mmHg (03/30 0612) SpO2:  [62 %-100 %] 100 % (03/30 0509) Last BM Date: 12/18/14  Intake/Output from previous day: 03/29 0701 - 03/30 0700 In: 2040 [P.O.:240; I.V.:1800] Out: 5000 [Urine:5000] Intake/Output this shift:      Lab Results:  Recent Labs  12/18/14 2205 12/19/14 0515 12/19/14 1100 12/19/14 1730 12/19/14 2255 12/20/14 0436  WBC 4.7 4.9  --   --   --  14.1*  HGB 11.1* 10.5* 12.4 12.0 11.8* 13.1  HCT 32.1* 30.2* 35.8* 34.8* 34.4* 37.8  PLT 194 207  --   --   --  232   BMET  Recent Labs  12/18/14 2205 12/19/14 0515 12/20/14 0436  NA 137 138 128*  K 3.5 3.0* 4.0  CL 101 106 98  CO2 23 22 20   CREATININE 0.57 0.42* 0.50   LFT  Recent Labs  12/18/14 2205 12/19/14 0515  PROT 7.1 6.4  AST 26 22  ALT 23 20  ALKPHOS 50 48  BILITOT 1.0 1.0   PT/INR  Recent Labs  12/18/14 2205  LABPROT 14.5  INR 1.12   PANCREAS No results for input(s): LIPASE in the last 72 hours.       Studies/Results: No results found.  Medications: I have reviewed the patient's current medications.  Assessment/Plan: 1. GI Bleed. Probably due to gastritis due to NSAIDS. Told to stop Aleve, change to PO protonix. OK to advance diet. Will sign off and would ask her to see Dr Paulita Fujita about 6-8 weeks after discharge.   [name],[name] 12/20/2014, 7:42 AM

## 2014-12-20 NOTE — Progress Notes (Signed)
  Echocardiogram 2D Echocardiogram has been performed.  Joelene Millin 12/20/2014, 10:51 AM

## 2014-12-20 NOTE — Progress Notes (Signed)
    Subjective:  Denies CP or dyspnea   Objective:  Filed Vitals:   12/19/14 1452 12/19/14 2020 12/20/14 0509 12/20/14 0612  BP: 154/83 166/91 177/90 140/80  Pulse: 93 80 87   Temp: 97.8 F (36.6 C) 98 F (36.7 C) 98.1 F (36.7 C)   TempSrc: Oral Oral Oral   Resp: 15 17 18    Height:      Weight:      SpO2: 98% 100% 100%     Intake/Output from previous day:  Intake/Output Summary (Last 24 hours) at 12/20/14 0850 Last data filed at 12/20/14 0700  Gross per 24 hour  Intake   2040 ml  Output   3350 ml  Net  -1310 ml    Physical Exam: Physical exam: Well-developed well-nourished in no acute distress.  Skin is warm and dry.  HEENT is normal.  Neck is supple.  Chest is clear to auscultation with normal expansion.  Cardiovascular exam is regular rate and rhythm.  Abdominal exam nontender or distended. No masses palpated. Extremities show no edema. neuro grossly intact    Lab Results: Basic Metabolic Panel:  Recent Labs  12/19/14 0515 12/20/14 0436  NA 138 128*  K 3.0* 4.0  CL 106 98  CO2 22 20  GLUCOSE 108* 106*  BUN 20 9  CREATININE 0.42* 0.50  CALCIUM 7.9* 8.1*   CBC:  Recent Labs  12/18/14 2205 12/19/14 0515  12/19/14 2255 12/20/14 0436  WBC 4.7 4.9  --   --  14.1*  NEUTROABS 4.0  --   --   --   --   HGB 11.1* 10.5*  < > 11.8* 13.1  HCT 32.1* 30.2*  < > 34.4* 37.8  MCV 93.3 92.6  --   --  91.7  PLT 194 207  --   --  232  < > = values in this interval not displayed. Cardiac Enzymes:  Recent Labs  12/18/14 2205  TROPONINI <0.03     Assessment/Plan:  1 paroxysmal atrial fibrillation-patient has converted to sinus rhythm. Would continue metoprolol 25 mg twice a day at discharge. At present she is not a candidate for anticoagulation given recent GI bleed. She would benefit from this long-term after she recovers. She will follow-up with Dr. Martinique to discuss this further. She appears to have very little insight and compliance may be an  issue. 2 SVT-as above continue beta blocker. 3 mitral valve prolapse-patient appears to have significant mitral regurgitation on echocardiogram at preliminary glance. She also has some degree of aortic stenosis. Await final echocardiogram results. She will follow-up with Dr. Martinique to discuss this further. 4 GI bleed-management per primary care. We will sign off. Please call with questions.  Kirk Ruths 12/20/2014, 8:50 AM

## 2014-12-20 NOTE — Clinical Documentation Improvement (Signed)
MD's, NP's, and PA's   Admitted with "anemia" if appropriate for this admission please specify type for more accurate documentation.  Thank you   Possible Clinical Conditions?  Acute Blood Loss Anemia Aplastic anemia Precipitous drop in Hematocrit Normocytic Anemia Iron deficiency Anemia Other Condition Cannot Clinically Determine     Diagnostic: CBC x 3  Treatments: NS @ 75 ml /hr  Thank You, Ree Kida ,RN Clinical Documentation Specialist:  [facility]

## 2014-12-20 NOTE — Progress Notes (Addendum)
[facility] PROGRESS NOTE  MACY POLIO TGG:[id number] DOB: October 10, 1926 DOA: 12/18/2014 PCP: Cari Caraway, MD  Assessment/Plan: 1-GI bleed: mild antral gastritis. On PPI. Monitor hb.   2-A fib, SVT; on metoprolol. No anticoagulation due to GI bleed.   3-Acute blood loss Anemia; in setting GI bleed. HB stable.   4-Hyponatremia; continue with IV fluids. Repeat labs today.  5-Leukocytosis; repeat labs in am.  6-Encephalopathy; patient is confuse. Suspect secondary to sedatives, if persist will consider further evaluation.   Code Status: DNR Family Communication: care discussed with patient  Disposition Plan: PT evaluation, repeat lbs to follow sodium level. SW consult.    Consultants:  Cardiology  GI  Procedures:  ECHO; pending.   Antibiotics:  none  HPI/Subjective: She wants to go home. She wants to follow with her doctors.  She denies pain. i have explain to her that she is not medical ready to be discharge.    Objective: Filed Vitals:   12/20/14 0612  BP: 140/80  Pulse:   Temp:   Resp:     Intake/Output Summary (Last 24 hours) at 12/20/14 1241 Last data filed at 12/20/14 1003  Gross per 24 hour  Intake   2280 ml  Output   3150 ml  Net   -870 ml   Filed Weights   12/19/14 0042  Weight: 54.4 kg (119 lb 14.9 oz)    Exam:   General:  Alert in no distress.   Cardiovascular: S 1, S 2 RRR  Respiratory: CTA  Abdomen: Bs present, soft, nt  Musculoskeletal: no edema.    Data Reviewed: Basic Metabolic Panel:  Recent Labs Lab 12/18/14 2205 12/19/14 0515 12/20/14 0436  NA 137 138 128*  K 3.5 3.0* 4.0  CL 101 106 98  CO2 23 22 20   GLUCOSE 172* 108* 106*  BUN 31* 20 9  CREATININE 0.57 0.42* 0.50  CALCIUM 8.6 7.9* 8.1*   Liver Function Tests:  Recent Labs Lab 12/18/14 2205 12/19/14 0515  AST 26 22  ALT 23 20  ALKPHOS 50 48  BILITOT 1.0 1.0  PROT 7.1 6.4  ALBUMIN 3.8 3.4*   No results for input(s): LIPASE, AMYLASE in the  last 168 hours. No results for input(s): AMMONIA in the last 168 hours. CBC:  Recent Labs Lab 12/18/14 2205 12/19/14 0515 12/19/14 1100 12/19/14 1730 12/19/14 2255 12/20/14 0436  WBC 4.7 4.9  --   --   --  14.1*  NEUTROABS 4.0  --   --   --   --   --   HGB 11.1* 10.5* 12.4 12.0 11.8* 13.1  HCT 32.1* 30.2* 35.8* 34.8* 34.4* 37.8  MCV 93.3 92.6  --   --   --  91.7  PLT 194 207  --   --   --  232   Cardiac Enzymes:  Recent Labs Lab 12/18/14 2205  TROPONINI <0.03   BNP (last 3 results) No results for input(s): BNP in the last 8760 hours.  ProBNP (last 3 results) No results for input(s): PROBNP in the last 8760 hours.  CBG: No results for input(s): GLUCAP in the last 168 hours.  No results found for this or any previous visit (from the past 240 hour(s)).   Studies: No results found.  Scheduled Meds: . magnesium oxide  800 mg Oral Daily  . metoprolol tartrate  25 mg Oral BID  . pantoprazole  40 mg Oral Daily  . thyroid  60 mg Oral QAC breakfast   Continuous Infusions: . sodium  chloride 75 mL/hr at 12/20/14 3128    Principal Problem:   GI bleeding Active Problems:   Vitamin D deficiency   Weakness generalized   Hypothyroid   Altered mental status   Atrial fibrillation with rapid ventricular response, paroxysmal   SVT (supraventricular tachycardia)   Hypokalemia    Time spent: 35 minutes.     Niel Hummer A  [facility] Pager [phone]. If 7PM-7AM, please contact night-coverage at [email or web address], password [facility] 12/20/2014, 12:41 PM  LOS: 2 days

## 2014-12-21 DIAGNOSIS — E876 Hypokalemia: Secondary | ICD-10-CM

## 2014-12-21 LAB — BASIC METABOLIC PANEL
Anion gap: 6 (ref 5–15)
BUN: 19 mg/dL (ref 6–23)
CO2: 22 mmol/L (ref 19–32)
Calcium: 7.9 mg/dL — ABNORMAL LOW (ref 8.4–10.5)
Chloride: 99 mmol/L (ref 96–112)
Creatinine, Ser: 0.49 mg/dL — ABNORMAL LOW (ref 0.50–1.10)
GFR calc Af Amer: >90 mL/min (ref >90)
GFR calc non Af Amer: 86 mL/min — ABNORMAL LOW (ref >90)
GLUCOSE: 91 mg/dL (ref 70–99)
Potassium: 3.7 mmol/L (ref 3.5–5.1)
Sodium: 127 mmol/L — ABNORMAL LOW (ref 135–145)

## 2014-12-21 LAB — OSMOLALITY, URINE: OSMOLALITY UR: 243 mosm/kg — AB (ref 390–1090)

## 2014-12-21 LAB — CBC
HCT: 31.1 % — ABNORMAL LOW (ref 36.0–46.0)
HEMOGLOBIN: 10.8 g/dL — AB (ref 12.0–15.0)
MCH: 31.7 pg (ref 26.0–34.0)
MCHC: 34.7 g/dL (ref 30.0–36.0)
MCV: 91.2 fL (ref 78.0–100.0)
PLATELETS: 202 10*3/uL (ref 150–400)
RBC: 3.41 MIL/uL — AB (ref 3.87–5.11)
RDW: 12.9 % (ref 11.5–15.5)
WBC: 6.5 10*3/uL (ref 4.0–10.5)

## 2014-12-21 LAB — SODIUM, URINE, RANDOM: Sodium, Ur: 31 mEq/L

## 2014-12-21 MED ORDER — SODIUM CHLORIDE 0.9 % IV SOLN: INTRAVENOUS | Status: DC

## 2014-12-21 NOTE — Progress Notes (Signed)
Clinical Social Work Department BRIEF PSYCHOSOCIAL ASSESSMENT 12/21/2014  Patient:  [name], [name]     Account Number:  [id number]     [place] date:  12/18/2014  Clinical Social Worker:  Glorious Peach, CLINICAL SOCIAL WORKER  Date/Time:  12/21/2014 03:16 PM  Referred by:  Physician  Date Referred:  12/21/2014 Referred for  SNF Placement   Other Referral:   Interview type:  Patient Other interview type:    PSYCHOSOCIAL DATA Living Status:  ALONE Admitted from facility:   Level of care:   Primary support name:  Carmine Savoy Primary support relationship to patient:  SIBLING Degree of support available:   Adequate    CURRENT CONCERNS Current Concerns  Post-Acute Placement   Other Concerns:    SOCIAL WORK ASSESSMENT / PLAN CSW received consult regarding pt going to a SNF for short term rehab. CSW completed FL2 and faxed pt out to [facility]   Assessment/plan status:  Information/Referral to [employer] Other assessment/ plan:   Information/referral to community resources:   CSW completed FL2 and faxed pt's information out to [facility] and received bed offers. CSW to meet with pt to discuss SNF vs Home with Home Health.    PATIENT'S/FAMILY'S RESPONSE TO PLAN OF CARE: CSW met with patient at bedside, introduced self and explained role. CSW informed pt about PT/OT recommendation for short term rehab at [facility]. Pt states that she is eager to leave [facility] and hopes that she will DC tomorrow. Pt says that she has not been to a SNF in the past but has had friends who have been to SNF. CSW gave pt a list of SNF that made bed offers as well as a map that shows her current home and SNF that made offers, pt is familiar with a few but wishes to speak with her sister Lelon Frohlich and friend at her church before making a decision. Pt says that her sister Lelon Frohlich, will be at the hospital tomorrow, so she will contact CSW first thing in the morning regarding further decisions for SNF. CSW  will continue to follow.       Glorious Peach BSW Intern

## 2014-12-21 NOTE — Consult Note (Signed)
Reason for Consult: Hyponatremia Referring Physician: Ashlynn Gunnels is an 81 y.o. female with PMhx significant for DJD/ostoeporosis, hypothyroidism who previously was living independently relying on friends and neighbors for support. She presented to the hospital on 3/29 with complaints of weakness, mild abdominal pain and some confusion.  She was noted to have melena and a hgb of 11.1 which dropped to 10.5- underwent EGD c/w gasritis and she had been taking NSAIDS.  Initial sodium on presentation was 138. She was given fluids but according to her in's and out's has much more out than in by 4 L. Yesterday her sodium was noted to drop to 128 and it stayed that way the last 24 hours. Her fluids have been discontinued.  Her urine osmolality was checked and is appropriately low for the setting. Her TSH is within normal limits. History taking is a little difficult S patient appears to be confused. Somewhat she has the idea that her sodium is low in the past she was told to eat regular meals and got it back up. I am not sure about this history. She tells me that she's been eating well up until the last week and she was having GI distress. There is an neighbor in the room and indicates that there has been weight loss over the last 6 months to a year.   Trend in Creatinine: CREATININE, SER  Date/Time Value Ref Range Status  12/21/2014 05:00 AM 0.49* 0.50 - 1.10 mg/dL Final  12/20/2014 09:13 PM 0.51 0.50 - 1.10 mg/dL Final  12/20/2014 02:30 PM 0.39* 0.50 - 1.10 mg/dL Final  12/20/2014 04:36 AM 0.50 0.50 - 1.10 mg/dL Final  12/19/2014 05:15 AM 0.42* 0.50 - 1.10 mg/dL Final  12/18/2014 10:05 PM 0.57 0.50 - 1.10 mg/dL Final  12/16/2011 11:56 PM 0.70 0.50 - 1.10 mg/dL Final  03/17/2009 03:33 AM 0.58 0.4 - 1.2 mg/dL Final   SODIUM  Date/Time Value Ref Range Status  12/21/2014 05:00 AM 127* 135 - 145 mmol/L Final  12/20/2014 09:13 PM 128* 135 - 145 mmol/L Final  12/20/2014 02:30 PM 127* 135 - 145  mmol/L Final  12/20/2014 04:36 AM 128* 135 - 145 mmol/L Final    Comment:    DELTA CHECK NOTED REPEATED TO VERIFY   12/19/2014 05:15 AM 138 135 - 145 mmol/L Final  12/18/2014 10:05 PM 137 135 - 145 mmol/L Final  12/16/2011 11:56 PM 133* 135 - 145 mEq/L Final  03/17/2009 03:33 AM 138 135 - 145 mEq/L Final   PMH:   Past Medical History  Diagnosis Date  . Cancer   . Breast cancer, left breast   . Osteoporosis   . Thyroid disease   . Hypertension   . MVP (mitral valve prolapse)     PSH:   Past Surgical History  Procedure Laterality Date  . Breast lumpectomy    . Hemrrhoidectomy    . Saline implant after left mastectomy    . Esophagogastroduodenoscopy N/A 12/19/2014    Procedure: ESOPHAGOGASTRODUODENOSCOPY (EGD);  Surgeon: Teena Irani, MD;  Location: [facility];  Service: Endoscopy;  Laterality: N/A;    Allergies:  Allergies  Allergen Reactions  . Ultram [Tramadol Hcl] Nausea Only  . Latex Rash    Skin rash with bandaids and tapes    Medications:   Prior to Admission medications   Medication Sig Start Date End Date Taking? Authorizing Provider  amoxicillin (AMOXIL) 500 MG tablet Take 500 mg by mouth daily as needed.     Historical Provider,  MD  cholecalciferol (VITAMIN D) 1000 UNITS tablet Take 5,000 Units by mouth 3 (three) times a week.    Historical Provider, MD  Magnesium 500 MG CAPS Take 1,000 mg by mouth daily.    Historical Provider, MD  Polyethyl Glycol-Propyl Glycol (SYSTANE PRESERVATIVE FREE) 0.4-0.3 % SOLN Apply to eye.    Historical Provider, MD  RESTASIS 0.05 % ophthalmic emulsion Place 1 drop into both eyes 2 (two) times daily as needed.  10/17/13   Historical Provider, MD  thyroid (ARMOUR) 30 MG tablet Take 30 mg by mouth daily before breakfast.   Yes Historical Provider, MD  UNABLE TO FIND Take by mouth daily. [place] ACID LIQUID    Historical Provider, MD    Discontinued Meds:   Medications Discontinued During This Encounter  Medication Reason  .  pantoprazole (PROTONIX) 80 mg in sodium chloride 0.9 % 754 mL IVPB Duplicate  . potassium chloride SA (K-DUR,KLOR-CON) CR tablet 40 mEq   . metoprolol (LOPRESSOR) injection 2.5 mg   . potassium chloride SA (K-DUR,KLOR-CON) CR tablet 40 mEq   . midazolam (VERSED) injection Patient Discharge  . fentaNYL (SUBLIMAZE) injection Patient Discharge  . butamben-tetracaine-benzocaine (CETACAINE) spray Patient Discharge  . ARMOUR THYROID 60 MG tablet Dose change  . metoprolol tartrate (LOPRESSOR) tablet 12.5 mg   . pantoprazole (PROTONIX) 80 mg in sodium chloride 0.9 % 250 mL (0.32 mg/mL) infusion   . pantoprazole (PROTONIX) injection 40 mg   . 0.9 %  sodium chloride infusion   . HYDROcodone-acetaminophen (NORCO/VICODIN) 5-325 MG per tablet 1-2 tablet   . 0.9 %  sodium chloride infusion   . 0.9 %  sodium chloride infusion     Social History:  reports that she has never smoked. She does not have any smokeless tobacco history on file. She reports that she does not drink alcohol or use illicit drugs.  Family History:   Family History  Problem Relation Age of Onset  . Cancer Sister     breast     A comprehensive review of systems was negative except for: Neurological: positive for Confusion Behavioral/Psych: positive for Abnormal speech off on tangents  Blood pressure 126/66, pulse 70, temperature 97.5 F (36.4 C), temperature source Oral, resp. rate 18, height 5\' 7"  (1.702 m), weight 51.6 kg (113 lb 12.1 oz), SpO2 99 %. General appearance: alert, distracted and no distress Eyes: conjunctivae/corneas clear. PERRL, EOM's intact. Fundi benign. Neck: no adenopathy, no carotid bruit, no JVD, supple, symmetrical, trachea midline and thyroid not enlarged, symmetric, no tenderness/mass/nodules Resp: clear to auscultation bilaterally Cardio: regular rate and rhythm, S1, S2 normal, no murmur, click, rub or gallop GI: soft, non-tender; bowel sounds normal; no masses,  no organomegaly Extremities:  extremities normal, atraumatic, no cyanosis or edema and varicose veins noted  Labs: Basic Metabolic Panel:  Recent Labs Lab 12/18/14 2205 12/19/14 0515 12/20/14 0436 12/20/14 1430 12/20/14 2113 12/21/14 0500  NA 137 138 128* 127* 128* 127*  K 3.5 3.0* 4.0 3.2* 3.5 3.7  CL 101 106 98 98 99 99  CO2 23 22 20 22 22 22   GLUCOSE 172* 108* 106* 129* 122* 91  BUN 31* 20 9 13 16 19   CREATININE 0.57 0.42* 0.50 0.39* 0.51 0.49*  ALBUMIN 3.8 3.4*  --   --   --   --   CALCIUM 8.6 7.9* 8.1* 7.9* 7.9* 7.9*   Liver Function Tests:  Recent Labs Lab 12/18/14 2205 12/19/14 0515  AST 26 22  ALT 23 20  ALKPHOS 50  48  BILITOT 1.0 1.0  PROT 7.1 6.4  ALBUMIN 3.8 3.4*   No results for input(s): LIPASE, AMYLASE in the last 168 hours. No results for input(s): AMMONIA in the last 168 hours. CBC:  Recent Labs Lab 12/18/14 2205 12/19/14 0515  12/19/14 1730 12/19/14 2255 12/20/14 0436 12/21/14 0500  WBC 4.7 4.9  --   --   --  14.1* 6.5  NEUTROABS 4.0  --   --   --   --   --   --   HGB 11.1* 10.5*  < > 12.0 11.8* 13.1 10.8*  HCT 32.1* 30.2*  < > 34.8* 34.4* 37.8 31.1*  MCV 93.3 92.6  --   --   --  91.7 91.2  PLT 194 207  --   --   --  232 202  < > = values in this interval not displayed. PT/INR: @labrcntip (inr:5) Cardiac Enzymes:  Recent Labs Lab 12/18/14 2205  TROPONINI <0.03   CBG: No results for input(s): GLUCAP in the last 168 hours.  Iron Studies: No results for input(s): IRON, TIBC, TRANSFERRIN, FERRITIN in the last 168 hours.  Xrays/Other Studies: No results found.   Assessment/Plan: 81 year old white female who is developed in-hospital hyponatremia in the setting of a GI bleed secondary to antral gastritis, poor by mouth intake and IV fluids 1. Hyponatremia-  has been developed in-house.  Her urine osmolality is appropriately low to the situation so is not overly impressive for SIADH. This be just poor solute intake and only IV fluid intake that led to this  hyponatremia?. I agree with stopping IV fluids but has just been stopped this morning. She is also on a fluid restriction which I agree with.  Now we need to let her equilibrate then I imagine that the sodium will improve.   2. Confusion- is difficult to say if it is due to the hyponatremia versus just an 81 year old female being in the hospital. Doubt it will be quick to clear. 3. Hypertension/volume- I agree that believe she is euvolemic. Therefore, no IV fluids are needed. She reports that her appetite is improved. Hopefully with increased solute intake and less fluid intake sodium will normalize.    thank you for this consult. I will continue to follow with you    [name] A 12/21/2014, 11:06 AM

## 2014-12-21 NOTE — Evaluation (Signed)
Physical Therapy Evaluation Patient Details Name: [name] MRN: [id number] DOB: 01/01/1927 Today's Date: 12/21/2014   History of Present Illness  81 y.o. female with PMhx significant for DJD, ostoeporosis, hypothyroidism admitted for GI Bleed, also developed hyponatremia.  Clinical Impression  Pt admitted with above diagnosis. Pt currently with functional limitations due to the deficits listed below (see PT Problem List).  Pt will benefit from skilled PT to increase their independence and safety with mobility to allow discharge to the venue listed below.   Pt tolerated mobility well utilizing RW for safety during ambulation.  Pt appears to present with decreased cognition however no friends/family to compare to baseline.  Recommend 24/7 assist due to cognitive impairments at this time, so pt may require SNF upon d/c.     Follow Up Recommendations SNF;Supervision/Assistance - 24 hour    Equipment Recommendations  None recommended by PT    Recommendations for Other Services       Precautions / Restrictions Precautions Precautions: Fall Restrictions Weight Bearing Restrictions: No      Mobility  Bed Mobility               General bed mobility comments: pt up in recliner  Transfers Overall transfer level: Needs assistance Equipment used: Rolling walker (2 wheeled) Transfers: Sit to/from Stand Sit to Stand: Min assist         General transfer comment: cues for hand placement, assist to rise and steady  Ambulation/Gait Ambulation/Gait assistance: Min guard Ambulation Distance (Feet): 400 Feet Assistive device: Rolling walker (2 wheeled) Gait Pattern/deviations: Step-through pattern;Drifts right/left Gait velocity: decr   General Gait Details: slow gait yet steady with RW  Stairs            Wheelchair Mobility    Modified Rankin (Stroke Patients Only)       Balance Overall balance assessment: Needs assistance Sitting-balance support: No upper  extremity supported;Feet supported Sitting balance-Leahy Scale: Good     Standing balance support: Single extremity supported;During functional activity Standing balance-Leahy Scale: Fair                               Pertinent Vitals/Pain Pain Assessment: No/denies pain Pain Score: 5  Pain Location: abdominal area Pain Descriptors / Indicators: Sore    Home Living Family/patient expects to be discharged to:: Private residence Living Arrangements: Alone Available Help at Discharge: Neighbor Type of Home: House Home Access: Stairs to enter   Technical brewer of Steps: 1 Home Layout: One level Home Equipment: Cane - single point;Walker - 2 wheels;Bedside commode;Shower seat      Prior Function Level of Independence: Independent with assistive device(s)               Hand Dominance   Dominant Hand: Right    Extremity/Trunk Assessment   Upper Extremity Assessment: Generalized weakness           Lower Extremity Assessment: Generalized weakness         Communication   Communication: No difficulties  Cognition Arousal/Alertness: Awake/alert Behavior During Therapy: WFL for tasks assessed/performed Overall Cognitive Status: No family/caregiver present to determine baseline cognitive functioning       Memory: Decreased short-term memory         General Comments: pt able to answer questions appropriately however seemed to have difficulty comprehending explanation to her questions/not receptive of the answers    General Comments      Exercises  Assessment/Plan    PT Assessment Patient needs continued PT services  PT Diagnosis Difficulty walking;Generalized weakness   PT Problem List Decreased strength;Decreased mobility;Decreased knowledge of use of DME;Decreased safety awareness;Decreased cognition  PT Treatment Interventions DME instruction;Gait training;Functional mobility training;Patient/family education;Therapeutic  activities;Therapeutic exercise   PT Goals (Current goals can be found in the Care Plan section) Acute Rehab PT Goals PT Goal Formulation: With patient Time For Goal Achievement: 12/28/14 Potential to Achieve Goals: Good    Frequency Min 3X/week   Barriers to discharge        Co-evaluation               End of Session Equipment Utilized During Treatment: Gait belt Activity Tolerance: Patient tolerated treatment well Patient left: in chair;with call bell/phone within reach;with chair alarm set Nurse Communication: Mobility status         Time: 1431-1455 PT Time Calculation (min) (ACUTE ONLY): 24 min   Charges:   PT Evaluation $Initial PT Evaluation Tier I: 1 Procedure     PT G Codes:        [name],KATHrine E 12/21/2014, 4:32 PM Carmelia Bake, PT, DPT 12/21/2014 Pager: [phone]

## 2014-12-21 NOTE — Evaluation (Signed)
Occupational Therapy Evaluation Patient Details Name: [name] MRN: [id number] DOB: 1927/07/21 Today's Date: 12/21/2014    History of Present Illness admitted 03/29 w/ melena and anemia. Had endoscopy   Clinical Impression   Pt with decline in function and saefty with ADLs and ADL mobility with decreased strength, balance, endurance, R UE ROM and decreased cognition. Pt confused and believes that she is at [facility] an that her hospital room phone number is her home phone number. Pt states that [place] and frequently forgets her cane at the market and has to go back and retrieve it. Recommend 24 hour sup initially if pt to d/c home. Pt would benefit form acute OT services to increase level of function an safety.    Follow Up Recommendations  Supervision/Assistance - 24 hour;SNF (may need SNF if unable tohave 24 hour sup and cognition not improving)    Equipment Recommendations   TBD   Recommendations for Other Services       Precautions / Restrictions Precautions Precautions: Fall Restrictions Weight Bearing Restrictions: No      Mobility Bed Mobility               General bed mobility comments: pt up in recliner  Transfers Overall transfer level: Needs assistance Equipment used: Straight cane Transfers: Sit to/from Stand Sit to Stand: Min assist         General transfer comment: cues for hand placement, forgetting to use cane    Balance Overall balance assessment: Needs assistance Sitting-balance support: No upper extremity supported;Feet supported Sitting balance-Leahy Scale: Good     Standing balance support: Single extremity supported;During functional activity Standing balance-Leahy Scale: Fair                              ADL Overall ADL's : Needs assistance/impaired     Grooming: Wash/dry hands;Wash/dry face;Standing;Min guard;Cueing for safety   Upper Body Bathing: Set up;Supervision/  safety;Sitting   Lower Body Bathing: Min guard;Cueing for safety   Upper Body Dressing : Supervision/safety;Set up;Sitting   Lower Body Dressing: Min guard;Cueing for safety   Toilet Transfer: Comfort height toilet;Grab bars;Minimal assistance   Toileting- Clothing Manipulation and Hygiene: Min guard;Cueing for safety       Functional mobility during ADLs: Min guard;Minimal assistance;Cueing for safety General ADL Comments: pt confused and forgetting to use cane in bathroom on the way back to her recliner     Vision  reading glasses, no change from baseline   Perception Perception Perception Tested?: No   Praxis Praxis Praxis tested?: Not tested    Pertinent Vitals/Pain Pain Assessment: 0-10 Pain Score: 5  Pain Location: abdominal area Pain Descriptors / Indicators: Sore     Hand Dominance Right   Extremity/Trunk Assessment Upper Extremity Assessment Upper Extremity Assessment: Generalized weakness  R UE shoulder impaired approximately 45 degrees flexion AROM, PROM WNL   Lower Extremity Assessment Lower Extremity Assessment: Defer to PT evaluation       Communication Communication Communication: No difficulties   Cognition Arousal/Alertness: Awake/alert Behavior During Therapy: WFL for tasks assessed/performed Overall Cognitive Status: No family/caregiver present to determine baseline cognitive functioning       Memory: Decreased short-term memory             General Comments   pt pleasantly confused and cooperative                 Home Living  Family/patient expects to be discharged to:: Private residence Living Arrangements: Alone Available Help at Discharge: Neighbor Type of Home: House Home Access: Stairs to enter [employer] of Steps: 1   Home Layout: One level     Bathroom Shower/Tub: Teacher, early years/pre: [place] - single point;Walker - 2 wheels;Bedside commode;Shower seat           Prior Functioning/Environment Level of Independence: Independent             OT Diagnosis: Generalized weakness;Acute pain   OT Problem List: Decreased strength;Decreased knowledge of use of DME or AE;Decreased activity tolerance;Pain;Impaired balance (sitting and/or standing);Decreased safety awareness;Decreased cognition   OT Treatment/Interventions: Self-care/ADL training;Therapeutic exercise;Patient/family education;Therapeutic activities;DME and/or AE instruction    OT Goals(Current goals can be found in the care plan section) Acute Rehab OT Goals OT Goal Formulation: With patient Time For Goal Achievement: 12/28/14 Potential to Achieve Goals: Good ADL Goals Pt Will Perform Grooming: with set-up;with supervision;standing Pt Will Perform Lower Body Bathing: with supervision;with set-up;sit to/from stand Pt Will Perform Lower Body Dressing: with supervision;with set-up;sit to/from stand Pt Will Transfer to Toilet: with min guard assist;with supervision;ambulating Pt Will Perform Toileting - Clothing Manipulation and hygiene: with supervision;with modified independence;sit to/from stand Pt Will Perform Tub/Shower Transfer: with min guard assist;shower seat;with supervision Additional ADL Goal #1: pt will use cane duirng ADL mobility with 1-2 cues to initate  OT Frequency: Min 2X/week   Barriers to D/C: Decreased caregiver support  pt lives at home alone, confused                     End of Session Equipment Utilized During Treatment: Other (comment) (SPC)  Activity Tolerance: Patient tolerated treatment well Patient left: in chair;with chair alarm set   Time: 8315-1761 OT Time Calculation (min): 22 min Charges:  OT General Charges $OT Visit: 1 Procedure OT Evaluation $Initial OT Evaluation Tier I: 1 Procedure OT Treatments $Therapeutic Activity: 8-22 mins G-Codes:    Britt Bottom 12/21/2014, 2:08 PM

## 2014-12-21 NOTE — Progress Notes (Signed)
CSW provided SNF bed offers, will follow-up in the morning re: SNF decision.    Clinical Social Work Department CLINICAL SOCIAL WORK PLACEMENT NOTE 12/21/2014  Patient:  [name], [name]  Account Number:  [id number] Admit date:  12/18/2014  Clinical Social Worker:  Renold Genta  Date/time:  12/21/2014 03:40 PM  Clinical Social Work is seeking post-discharge placement for this patient at the following level of care:   SKILLED NURSING   (*CSW will update this form in Epic as items are completed)   12/21/2014  Patient/family provided with [place] Department of Clinical Social Work's list of facilities offering this level of care within the geographic area requested by the patient (or if unable, by the patient's family).  12/21/2014  Patient/family informed of their freedom to choose among providers that offer the needed level of care, that participate in Medicare, Medicaid or managed care program needed by the patient, have an available bed and are willing to accept the patient.  12/21/2014  Patient/family informed of [facility]' ownership interest in [facility], as well as of the fact that they are under no obligation to receive care at this facility.  PASARR submitted to EDS on 12/21/2014 PASARR number received on 12/21/2014  FL2 transmitted to all facilities in geographic area requested by pt/family on  12/21/2014 FL2 transmitted to all facilities within larger geographic area on   Patient informed that his/her managed care company has contracts with or will negotiate with  certain facilities, including the following:     Patient/family informed of bed offers received:  12/21/2014 Patient chooses bed at  Physician recommends and patient chooses bed at    Patient to be transferred to  on   Patient to be transferred to facility by  Patient and family notified of transfer on  Name of family member notified:    The following physician request were entered in  Epic:   Additional Comments:      Raynaldo Opitz, [place] Social Worker cell #: [phone]

## 2014-12-21 NOTE — Progress Notes (Signed)
[facility] PROGRESS NOTE  [name] QQV:[id number] DOB: [birth year] DOA: 12/18/2014 PCP: Cari Caraway, MD  Assessment/Plan: 1-GI bleed: mild antral gastritis. On PPI. Monitor hb.   2-A fib, SVT; on metoprolol. No anticoagulation due to GI bleed.   3-Acute blood loss Anemia; in setting GI bleed. Follow hb trend. No further bleed.   4-Hyponatremia; Appears euvolemic. received IV fluids on 3-29, IV fluids stop 3-30. Sodium at 127 still. Urine sodium at 31, urine osmolality at 243. SIADH ?Marland Kitchen Will consult renal.   5-Leukocytosis; repeat labs in am.   6-Encephalopathy; patient is confuse. Suspect secondary to sedatives, if persist will consider further evaluation.   Code Status: DNR Family Communication: care discussed with patient  Disposition Plan: PT evaluation,  SW consult.    Consultants:  Cardiology  GI  Procedures:  ECHO; normal EF.   Antibiotics:  none  HPI/Subjective: She appears less confuse today. Alert and oriented to place.  No new complaints. Wants to walk today.   Objective: Filed Vitals:   12/21/14 0559  BP: 126/66  Pulse: 70  Temp: 97.5 F (36.4 C)  Resp: 18    Intake/Output Summary (Last 24 hours) at 12/21/14 0953 Last data filed at 12/21/14 0830  Gross per 24 hour  Intake 1313.75 ml  Output   1400 ml  Net -86.25 ml   Filed Weights   12/19/14 0042 12/21/14 0559  Weight: 54.4 kg (119 lb 14.9 oz) 51.6 kg (113 lb 12.1 oz)    Exam:   General:  Alert in no distress.   Cardiovascular: S 1, S 2 RRR  Respiratory: CTA  Abdomen: Bs present, soft, nt  Musculoskeletal: no edema.    Data Reviewed: Basic Metabolic Panel:  Recent Labs Lab 12/19/14 0515 12/20/14 0436 12/20/14 1430 12/20/14 2113 12/21/14 0500  NA 138 128* 127* 128* 127*  K 3.0* 4.0 3.2* 3.5 3.7  CL 106 98 98 99 99  CO2 22 20 22 22 22   GLUCOSE 108* 106* 129* 122* 91  BUN 20 9 13 16 19   CREATININE 0.42* 0.50 0.39* 0.51 0.49*  CALCIUM 7.9* 8.1* 7.9* 7.9*  7.9*   Liver Function Tests:  Recent Labs Lab 12/18/14 2205 12/19/14 0515  AST 26 22  ALT 23 20  ALKPHOS 50 48  BILITOT 1.0 1.0  PROT 7.1 6.4  ALBUMIN 3.8 3.4*   No results for input(s): LIPASE, AMYLASE in the last 168 hours. No results for input(s): AMMONIA in the last 168 hours. CBC:  Recent Labs Lab 12/18/14 2205 12/19/14 0515 12/19/14 1100 12/19/14 1730 12/19/14 2255 12/20/14 0436 12/21/14 0500  WBC 4.7 4.9  --   --   --  14.1* 6.5  NEUTROABS 4.0  --   --   --   --   --   --   HGB 11.1* 10.5* 12.4 12.0 11.8* 13.1 10.8*  HCT 32.1* 30.2* 35.8* 34.8* 34.4* 37.8 31.1*  MCV 93.3 92.6  --   --   --  91.7 91.2  PLT 194 207  --   --   --  232 202   Cardiac Enzymes:  Recent Labs Lab 12/18/14 2205  TROPONINI <0.03   BNP (last 3 results) No results for input(s): BNP in the last 8760 hours.  ProBNP (last 3 results) No results for input(s): PROBNP in the last 8760 hours.  CBG: No results for input(s): GLUCAP in the last 168 hours.  No results found for this or any previous visit (from the past 240 hour(s)).  Studies: No results found.  Scheduled Meds: . magnesium oxide  800 mg Oral Daily  . metoprolol tartrate  25 mg Oral BID  . pantoprazole  40 mg Oral Daily  . thyroid  60 mg Oral QAC breakfast   Continuous Infusions:    Principal Problem:   GI bleeding Active Problems:   Vitamin D deficiency   Weakness generalized   Hypothyroid   Altered mental status   Atrial fibrillation with rapid ventricular response, paroxysmal   SVT (supraventricular tachycardia)   Hypokalemia    Time spent: 35 minutes.     Niel Hummer A  [facility] Pager [phone]. If 7PM-7AM, please contact night-coverage at [email or web address], password [facility] 12/21/2014, 9:53 AM  LOS: 3 days

## 2014-12-22 LAB — BASIC METABOLIC PANEL
ANION GAP: 7 (ref 5–15)
BUN: 21 mg/dL (ref 6–23)
CHLORIDE: 101 mmol/L (ref 96–112)
CO2: 22 mmol/L (ref 19–32)
CREATININE: 0.66 mg/dL (ref 0.50–1.10)
Calcium: 8.2 mg/dL — ABNORMAL LOW (ref 8.4–10.5)
GFR calc Af Amer: >90 mL/min (ref >90)
GFR calc non Af Amer: 78 mL/min — ABNORMAL LOW (ref >90)
Glucose, Bld: 88 mg/dL (ref 70–99)
POTASSIUM: 4.2 mmol/L (ref 3.5–5.1)
Sodium: 130 mmol/L — ABNORMAL LOW (ref 135–145)

## 2014-12-22 LAB — HEMOGLOBIN AND HEMATOCRIT, BLOOD
HCT: 33.7 % — ABNORMAL LOW (ref 36.0–46.0)
Hemoglobin: 11.8 g/dL — ABNORMAL LOW (ref 12.0–15.0)

## 2014-12-22 MED ORDER — SORBITOL 70 % SOLN
30.0000 mL | Freq: Once | Status: AC
  Administered 2014-12-22: 30 mL via ORAL
  Filled 2014-12-22: qty 30

## 2014-12-22 MED ORDER — PANTOPRAZOLE SODIUM 40 MG PO TBEC
40.0000 mg | DELAYED_RELEASE_TABLET | Freq: Every day | ORAL | Status: DC
Start: 2014-12-22 — End: 2015-02-18

## 2014-12-22 MED ORDER — METOPROLOL TARTRATE 25 MG PO TABS: 25.0000 mg | ORAL_TABLET | Freq: Two times a day (BID) | ORAL | Status: DC

## 2014-12-22 MED ORDER — SORBITOL 70 % PO SOLN: 15.0000 mL | Freq: Every day | ORAL | Status: DC | PRN

## 2014-12-22 MED ORDER — THYROID 60 MG PO TABS: 60.0000 mg | ORAL_TABLET | Freq: Every day | ORAL | Status: DC

## 2014-12-22 NOTE — Progress Notes (Signed)
Patient is set to discharge to [facility] SNF today. Patient & sister, Lelon Frohlich at bedside aware. Discharge packet given to RN, Shirlee Limerick. Sister to transport to SNF.   Clinical Social Work Department CLINICAL SOCIAL WORK PLACEMENT NOTE 12/22/2014  Patient:  HEVIN, JEFFCOAT  Account Number:  [id number] Admit date:  12/18/2014  Clinical Social Worker:  Renold Genta  Date/time:  12/21/2014 03:40 PM  Clinical Social Work is seeking post-discharge placement for this patient at the following level of care:   SKILLED NURSING   (*CSW will update this form in Epic as items are completed)   12/21/2014  Patient/family provided with [place] Department of Clinical Social Work's list of facilities offering this level of care within the geographic area requested by the patient (or if unable, by the patient's family).  12/21/2014  Patient/family informed of their freedom to choose among providers that offer the needed level of care, that participate in Medicare, Medicaid or managed care program needed by the patient, have an available bed and are willing to accept the patient.  12/21/2014  Patient/family informed of [facility]' ownership interest in [facility], as well as of the fact that they are under no obligation to receive care at this facility.  PASARR submitted to EDS on 12/21/2014 PASARR number received on 12/21/2014  FL2 transmitted to all facilities in geographic area requested by pt/family on  12/21/2014 FL2 transmitted to all facilities within larger geographic area on   Patient informed that his/her managed care company has contracts with or will negotiate with  certain facilities, including the following:     Patient/family informed of bed offers received:  12/21/2014 Patient chooses bed at [place] Physician recommends and patient chooses bed at    Patient to be transferred to [place] on  12/22/2014 Patient to be transferred to  facility by [facility] Patient and family notified of transfer on 12/22/2014 Name of family member notified:  patient's sister, Lelon Frohlich at bedside  The following physician request were entered in Epic:   Additional Comments:     Raynaldo Opitz, [place] Social Worker cell #: [phone]

## 2014-12-22 NOTE — Progress Notes (Signed)
I see plans for discharge- I feel like sodium  Should continue to trend in the right direction as her food intake increases.  No additional treatment is needed for her hyponatremia   [name] A

## 2014-12-22 NOTE — Progress Notes (Signed)
Discharge to [facility], report given to [facility]. Supvr.   Discharge packet given  And transpoted by Lelon Frohlich Patients sister.PIV removed by NT no s/s of infiltration or swelling noted.

## 2014-12-22 NOTE — Discharge Summary (Signed)
Physician Discharge Summary  [name] YSA:[id number] DOB: 1927/08/08 DOA: 12/18/2014  PCP: Cari Caraway, MD  Admit date: 12/18/2014 Discharge date: 12/22/2014  Time spent: 35 minutes  Recommendations for Outpatient Follow-up:  Repeat B-met to monitor sodium level.  Needs to follow up with cardiology in 4 weeks.  CBC to follow Hb level.   Discharge Diagnoses:    GI bleeding, gastritis.    Vitamin D deficiency   Weakness generalized   Hypothyroid   Encephalopathy   Atrial fibrillation with rapid ventricular response, paroxysmal   SVT (supraventricular tachycardia)   Hypokalemia   Discharge Condition: Stable.   Diet recommendation: regular diet  Filed Weights   12/19/14 0042 12/21/14 0559 12/22/14 0509  Weight: 54.4 kg (119 lb 14.9 oz) 51.6 kg (113 lb 12.1 oz) 52.8 kg (116 lb 6.5 oz)    History of present illness:  [name] is a 81 y.o. female, history of osteoarthritis, hypothyroidism, osteoporosis, chronic right shoulder pain presents with generalized weakness, presents initially to urgent care, will send her to tell him to ED, patient lives home by herself, ambulates with a cane at baseline, no family members here, has sisters living in [place], rely on neighbors and friends for support, patient is mildly confused, patient friend at bedside helping with the history, report patient has been feeling weak for the last week, has been complaining of mild abdominal pain this afternoon, in ED patient was noticed to have melena, and was Hemoccult positive, hemoglobin is 11.9, no recent baseline, patient reports she has been using Aleve recently, denies any coffee-ground emesis, any bright red blood per rectum, reports constipation and diarrhea in the same time, HAS chronic problem with constipation, been followed by Dr. Paulita Fujita as an outpatient, most recent colonoscopy in 1987, has been declining to have repeat colonoscopy since.  Hospital Course:  1-GI bleed: mild antral  gastritis. On PPI. Hb stable   2-A fib, SVT; on metoprolol. No anticoagulation due to GI bleed.   3-Acute blood loss Anemia; in setting GI bleed. Follow hb trend. No further bleed. Hb stable  4-Hyponatremia; Appears euvolemic. received IV fluids on 3-29, IV fluids stop 3-30. Sodium at 127 still. Urine sodium at 31, urine osmolality at 243. Related to IV fluids. Improving, with fluids restrictions. Appreciate Dr Girtha Rm borough help/   5-Leukocytosis; resolved.   6-Encephalopathy; patient is confuse. Suspect secondary to sedatives, hospital delirium. Improving.  7-History of constipation; will try sorbitol.   Procedures: Endoscopy: small hiatal hernia,, mild antral gastritis.  ECHO; normal EF.  Consultations:  Cardiology  nephrology  Discharge Exam: Filed Vitals:   12/22/14 0509  BP: 123/63  Pulse: 73  Temp: 98.7 F (37.1 C)  Resp: 18    General: Alert in no Distress. Cardiovascular: S 1, S 2 RRR Respiratory: CTA  Discharge Instructions   Discharge Instructions    Diet general    Complete by:  As directed      Increase activity slowly    Complete by:  As directed           Current Discharge Medication List    START taking these medications   Details  metoprolol tartrate (LOPRESSOR) 25 MG tablet Take 1 tablet (25 mg total) by mouth 2 (two) times daily. Qty: 60 tablet, Refills: 0    pantoprazole (PROTONIX) 40 MG tablet Take 1 tablet (40 mg total) by mouth daily. Qty: 30 tablet, Refills: 0    sorbitol 70 % solution Take 15 mLs by mouth daily as needed. Qty: 473  mL, Refills: 0      CONTINUE these medications which have CHANGED   Details  thyroid (ARMOUR THYROID) 60 MG tablet Take 1 tablet (60 mg total) by mouth daily before breakfast. Qty: 30 tablet, Refills: 0      CONTINUE these medications which have NOT CHANGED   Details  cholecalciferol (VITAMIN D) 1000 UNITS tablet Take 5,000 Units by mouth 3 (three) times a week.    Magnesium 500 MG CAPS Take  1,000 mg by mouth daily.    Polyethyl Glycol-Propyl Glycol (SYSTANE PRESERVATIVE FREE) 0.4-0.3 % SOLN Apply to eye.    RESTASIS 0.05 % ophthalmic emulsion Place 1 drop into both eyes 2 (two) times daily as needed.       STOP taking these medications     amoxicillin (AMOXIL) 500 MG tablet      UNABLE TO FIND        Allergies  Allergen Reactions  . Ultram [Tramadol Hcl] Nausea Only  . Latex Rash    Skin rash with bandaids and tapes   Follow-up Information    Follow up with MCNEILL,WENDY, MD In 1 week.   Specialty:  Family Medicine   Contact information:   [place] [address] [phone]       Follow up with Peter Martinique, MD In 2 weeks.   Specialty:  Cardiology   Contact information:   [address] [place] [address] [phone]        The results of significant diagnostics from this hospitalization (including imaging, microbiology, ancillary and laboratory) are listed below for reference.    Significant Diagnostic Studies: No results found.  Microbiology: No results found for this or any previous visit (from the past 240 hour(s)).   Labs: Basic Metabolic Panel:  Recent Labs Lab 12/20/14 0436 12/20/14 1430 12/20/14 2113 12/21/14 0500 12/22/14 0400  NA 128* 127* 128* 127* 130*  K 4.0 3.2* 3.5 3.7 4.2  CL 98 98 99 99 101  CO2 _0 GLUCOSE 106* 129* 122* 91 88  BUN _1 CREATININE 0.50 0.39* 0.51 0.49* 0.66  CALCIUM 8.1* 7.9* 7.9* 7.9* 8.2*   Liver Function Tests:  Recent Labs Lab 12/18/14 2205 12/19/14 0515  AST 26 22  ALT 23 20  ALKPHOS 50 48  BILITOT 1.0 1.0  PROT 7.1 6.4  ALBUMIN 3.8 3.4*   No results for input(s): LIPASE, AMYLASE in the last 168 hours. No results for input(s): AMMONIA in the last 168 hours. CBC:  Recent Labs Lab 12/18/14 2205 12/19/14 0515  12/19/14 1730 12/19/14 2255 12/20/14 0436 12/21/14 0500 12/22/14 0400  WBC 4.7 4.9  --   --   --  14.1* 6.5   --   NEUTROABS 4.0  --   --   --   --   --   --   --   HGB 11.1* 10.5*  < > 12.0 11.8* 13.1 10.8* 11.8*  HCT 32.1* 30.2*  < > 34.8* 34.4* 37.8 31.1* 33.7*  MCV 93.3 92.6  --   --   --  91.7 91.2  --   PLT 194 207  --   --   --  232 202  --   < > = values in this interval not displayed. Cardiac Enzymes:  Recent Labs Lab 12/18/14 2205  TROPONINI <0.03   BNP: BNP (last 3 results) No results for input(s): BNP in the last 8760 hours.  ProBNP (last  3 results) No results for input(s): PROBNP in the last 8760 hours.  CBG: No results for input(s): GLUCAP in the last 168 hours.     Signed:  Niel Hummer A  [facility] 12/22/2014, 9:29 AM

## 2015-01-04 ENCOUNTER — Ambulatory Visit
Admission: RE | Admit: 2015-01-04 | Discharge: 2015-01-04 | Disposition: A | Discharge status: Home / Self Care | Payer: Medicare Other | Source: Ambulatory Visit | Attending: Gastroenterology | Admitting: Gastroenterology

## 2015-01-04 ENCOUNTER — Other Ambulatory Visit: Payer: Self-pay | Admitting: Gastroenterology

## 2015-01-04 DIAGNOSIS — R140 Abdominal distension (gaseous): Secondary | ICD-10-CM

## 2015-01-23 ENCOUNTER — Telehealth: Payer: Self-pay | Admitting: Cardiology

## 2015-01-23 NOTE — Telephone Encounter (Signed)
Received records from [facility] for appointment on 01/25/15 with Ellen Henri, [place].  Records given to [employer] (medical records) for Brittany's schedule on 01/25/15. lp

## 2015-01-25 ENCOUNTER — Ambulatory Visit: Payer: PRIVATE HEALTH INSURANCE | Admitting: Cardiology

## 2015-02-01 ENCOUNTER — Other Ambulatory Visit: Payer: Self-pay | Admitting: Physician Assistant

## 2015-02-01 ENCOUNTER — Ambulatory Visit
Admission: RE | Admit: 2015-02-01 | Discharge: 2015-02-01 | Disposition: A | Discharge status: Home / Self Care | Payer: Medicare Other | Source: Ambulatory Visit | Attending: Physician Assistant | Admitting: Physician Assistant

## 2015-02-01 DIAGNOSIS — K5909 Other constipation: Secondary | ICD-10-CM

## 2015-02-13 NOTE — Patient Outreach (Signed)
[name]  02/13/2015  [name] [birth year] [id number]   Referral from MD with notes, assigned [name], RN.  [name]. [place], [place] Management [place] Assistant Phone: [phone] Fax: [phone]

## 2015-02-14 ENCOUNTER — Other Ambulatory Visit: Payer: Self-pay | Admitting: Gastroenterology

## 2015-02-14 DIAGNOSIS — K5900 Constipation, unspecified: Secondary | ICD-10-CM

## 2015-02-17 ENCOUNTER — Encounter (HOSPITAL_COMMUNITY): Payer: Self-pay | Admitting: Emergency Medicine

## 2015-02-17 ENCOUNTER — Emergency Department (HOSPITAL_COMMUNITY): Payer: Medicare Other

## 2015-02-17 ENCOUNTER — Inpatient Hospital Stay (HOSPITAL_COMMUNITY)
Admission: EM | Admit: 2015-02-17 | Discharge: 2015-03-02 | DRG: 689 | Disposition: A | Discharge status: Skilled Nursing Facility | Payer: Medicare Other | Attending: Internal Medicine | Admitting: Internal Medicine

## 2015-02-17 DIAGNOSIS — R109 Unspecified abdominal pain: Secondary | ICD-10-CM

## 2015-02-17 DIAGNOSIS — I480 Paroxysmal atrial fibrillation: Secondary | ICD-10-CM | POA: Diagnosis present

## 2015-02-17 DIAGNOSIS — I350 Nonrheumatic aortic (valve) stenosis: Secondary | ICD-10-CM | POA: Diagnosis present

## 2015-02-17 DIAGNOSIS — Z853 Personal history of malignant neoplasm of breast: Secondary | ICD-10-CM

## 2015-02-17 DIAGNOSIS — E039 Hypothyroidism, unspecified: Secondary | ICD-10-CM | POA: Diagnosis present

## 2015-02-17 DIAGNOSIS — R296 Repeated falls: Secondary | ICD-10-CM | POA: Diagnosis present

## 2015-02-17 DIAGNOSIS — R011 Cardiac murmur, unspecified: Secondary | ICD-10-CM | POA: Diagnosis present

## 2015-02-17 DIAGNOSIS — K5900 Constipation, unspecified: Secondary | ICD-10-CM | POA: Diagnosis present

## 2015-02-17 DIAGNOSIS — Z66 Do not resuscitate: Secondary | ICD-10-CM | POA: Diagnosis present

## 2015-02-17 DIAGNOSIS — Z9012 Acquired absence of left breast and nipple: Secondary | ICD-10-CM | POA: Diagnosis present

## 2015-02-17 DIAGNOSIS — F0391 Unspecified dementia with behavioral disturbance: Secondary | ICD-10-CM | POA: Diagnosis present

## 2015-02-17 DIAGNOSIS — Z888 Allergy status to other drugs, medicaments and biological substances status: Secondary | ICD-10-CM

## 2015-02-17 DIAGNOSIS — Z79899 Other long term (current) drug therapy: Secondary | ICD-10-CM | POA: Diagnosis not present

## 2015-02-17 DIAGNOSIS — M810 Age-related osteoporosis without current pathological fracture: Secondary | ICD-10-CM | POA: Diagnosis present

## 2015-02-17 DIAGNOSIS — N10 Acute tubulo-interstitial nephritis: Secondary | ICD-10-CM | POA: Diagnosis not present

## 2015-02-17 DIAGNOSIS — Z91048 Other nonmedicinal substance allergy status: Secondary | ICD-10-CM

## 2015-02-17 DIAGNOSIS — N390 Urinary tract infection, site not specified: Secondary | ICD-10-CM | POA: Diagnosis present

## 2015-02-17 DIAGNOSIS — Z881 Allergy status to other antibiotic agents status: Secondary | ICD-10-CM

## 2015-02-17 DIAGNOSIS — I493 Ventricular premature depolarization: Secondary | ICD-10-CM | POA: Diagnosis present

## 2015-02-17 DIAGNOSIS — K567 Ileus, unspecified: Secondary | ICD-10-CM | POA: Diagnosis not present

## 2015-02-17 DIAGNOSIS — G92 Toxic encephalopathy: Secondary | ICD-10-CM | POA: Diagnosis present

## 2015-02-17 DIAGNOSIS — W1809XS Striking against other object with subsequent fall, sequela: Secondary | ICD-10-CM | POA: Diagnosis not present

## 2015-02-17 DIAGNOSIS — W1830XA Fall on same level, unspecified, initial encounter: Secondary | ICD-10-CM | POA: Diagnosis present

## 2015-02-17 DIAGNOSIS — E43 Unspecified severe protein-calorie malnutrition: Secondary | ICD-10-CM | POA: Diagnosis present

## 2015-02-17 DIAGNOSIS — R410 Disorientation, unspecified: Secondary | ICD-10-CM | POA: Diagnosis present

## 2015-02-17 DIAGNOSIS — Z885 Allergy status to narcotic agent status: Secondary | ICD-10-CM

## 2015-02-17 DIAGNOSIS — I959 Hypotension, unspecified: Secondary | ICD-10-CM | POA: Diagnosis not present

## 2015-02-17 DIAGNOSIS — R4182 Altered mental status, unspecified: Secondary | ICD-10-CM

## 2015-02-17 DIAGNOSIS — I10 Essential (primary) hypertension: Secondary | ICD-10-CM | POA: Diagnosis present

## 2015-02-17 DIAGNOSIS — R404 Transient alteration of awareness: Secondary | ICD-10-CM | POA: Diagnosis not present

## 2015-02-17 DIAGNOSIS — Z91040 Latex allergy status: Secondary | ICD-10-CM

## 2015-02-17 DIAGNOSIS — I341 Nonrheumatic mitral (valve) prolapse: Secondary | ICD-10-CM | POA: Diagnosis present

## 2015-02-17 DIAGNOSIS — K560 Paralytic ileus: Secondary | ICD-10-CM | POA: Diagnosis not present

## 2015-02-17 DIAGNOSIS — Z681 Body mass index (BMI) 19 or less, adult: Secondary | ICD-10-CM | POA: Diagnosis not present

## 2015-02-17 DIAGNOSIS — Y92009 Unspecified place in unspecified non-institutional (private) residence as the place of occurrence of the external cause: Secondary | ICD-10-CM

## 2015-02-17 DIAGNOSIS — I4891 Unspecified atrial fibrillation: Secondary | ICD-10-CM

## 2015-02-17 DIAGNOSIS — R140 Abdominal distension (gaseous): Secondary | ICD-10-CM

## 2015-02-17 DIAGNOSIS — S0101XA Laceration without foreign body of scalp, initial encounter: Secondary | ICD-10-CM | POA: Diagnosis present

## 2015-02-17 DIAGNOSIS — R627 Adult failure to thrive: Secondary | ICD-10-CM | POA: Diagnosis not present

## 2015-02-17 DIAGNOSIS — F05 Delirium due to known physiological condition: Secondary | ICD-10-CM | POA: Diagnosis not present

## 2015-02-17 DIAGNOSIS — B952 Enterococcus as the cause of diseases classified elsewhere: Secondary | ICD-10-CM | POA: Diagnosis present

## 2015-02-17 DIAGNOSIS — Z803 Family history of malignant neoplasm of breast: Secondary | ICD-10-CM

## 2015-02-17 DIAGNOSIS — K56609 Unspecified intestinal obstruction, unspecified as to partial versus complete obstruction: Secondary | ICD-10-CM

## 2015-02-17 HISTORY — DX: Systemic involvement of connective tissue, unspecified: M35.9

## 2015-02-17 LAB — URINALYSIS, ROUTINE W REFLEX MICROSCOPIC
Bilirubin Urine: NEGATIVE
GLUCOSE, UA: NEGATIVE mg/dL
Ketones, ur: >80 mg/dL — AB
Nitrite: NEGATIVE
Protein, ur: 30 mg/dL — AB
Specific Gravity, Urine: 1.016 (ref 1.005–1.030)
Urobilinogen, UA: 0.2 mg/dL (ref 0.0–1.0)
pH: 6 (ref 5.0–8.0)

## 2015-02-17 LAB — COMPREHENSIVE METABOLIC PANEL
ALBUMIN: 3.3 g/dL — AB (ref 3.5–5.0)
ALK PHOS: 129 U/L — AB (ref 38–126)
ALT: 38 U/L (ref 14–54)
ANION GAP: 14 (ref 5–15)
AST: 44 U/L — ABNORMAL HIGH (ref 15–41)
BUN: 11 mg/dL (ref 6–20)
CO2: 28 mmol/L (ref 22–32)
Calcium: 8.5 mg/dL — ABNORMAL LOW (ref 8.9–10.3)
Chloride: 90 mmol/L — ABNORMAL LOW (ref 101–111)
Creatinine, Ser: 0.56 mg/dL (ref 0.44–1.00)
GFR calc non Af Amer: >60 mL/min (ref >60)
Glucose, Bld: 92 mg/dL (ref 65–99)
Potassium: 3.6 mmol/L (ref 3.5–5.1)
SODIUM: 132 mmol/L — AB (ref 135–145)
TOTAL PROTEIN: 6.7 g/dL (ref 6.5–8.1)
Total Bilirubin: 1.5 mg/dL — ABNORMAL HIGH (ref 0.3–1.2)

## 2015-02-17 LAB — URINE MICROSCOPIC-ADD ON

## 2015-02-17 LAB — CBC WITH DIFFERENTIAL/PLATELET
Basophils Absolute: 0 10*3/uL (ref 0.0–0.1)
Basophils Relative: 0 % (ref 0–1)
Eosinophils Absolute: 0 10*3/uL (ref 0.0–0.7)
Eosinophils Relative: 0 % (ref 0–5)
HEMATOCRIT: 40.1 % (ref 36.0–46.0)
Hemoglobin: 14.2 g/dL (ref 12.0–15.0)
LYMPHS PCT: 5 % — AB (ref 12–46)
Lymphs Abs: 0.4 10*3/uL — ABNORMAL LOW (ref 0.7–4.0)
MCH: 32.3 pg (ref 26.0–34.0)
MCHC: 35.4 g/dL (ref 30.0–36.0)
MCV: 91.1 fL (ref 78.0–100.0)
MONOS PCT: 5 % (ref 3–12)
Monocytes Absolute: 0.5 10*3/uL (ref 0.1–1.0)
Neutro Abs: 8.5 10*3/uL — ABNORMAL HIGH (ref 1.7–7.7)
Neutrophils Relative %: 90 % — ABNORMAL HIGH (ref 43–77)
PLATELETS: 170 10*3/uL (ref 150–400)
RBC: 4.4 MIL/uL (ref 3.87–5.11)
RDW: 14.9 % (ref 11.5–15.5)
WBC: 9.4 10*3/uL (ref 4.0–10.5)

## 2015-02-17 MED ORDER — ACETAMINOPHEN 650 MG RE SUPP: 650.0000 mg | Freq: Four times a day (QID) | RECTAL | Status: DC | PRN

## 2015-02-17 MED ORDER — ENOXAPARIN SODIUM 40 MG/0.4ML ~~LOC~~ SOLN: 40.0000 mg | SUBCUTANEOUS | Status: DC

## 2015-02-17 MED ORDER — METOPROLOL TARTRATE 25 MG PO TABS
25.0000 mg | ORAL_TABLET | Freq: Two times a day (BID) | ORAL | Status: DC
  Administered 2015-02-17 – 2015-03-02 (×26): 25 mg via ORAL
  Filled 2015-02-17 (×27): qty 1

## 2015-02-17 MED ORDER — LUBIPROSTONE 24 MCG PO CAPS
24.0000 ug | ORAL_CAPSULE | Freq: Two times a day (BID) | ORAL | Status: DC
  Administered 2015-02-20 – 2015-03-02 (×16): 24 ug via ORAL
  Filled 2015-02-17 (×28): qty 1

## 2015-02-17 MED ORDER — PANTOPRAZOLE SODIUM 40 MG PO TBEC
40.0000 mg | DELAYED_RELEASE_TABLET | Freq: Every day | ORAL | Status: DC
  Administered 2015-02-17 – 2015-03-02 (×6): 40 mg via ORAL
  Filled 2015-02-17 (×9): qty 1

## 2015-02-17 MED ORDER — MAGNESIUM OXIDE 400 (241.3 MG) MG PO TABS
800.0000 mg | ORAL_TABLET | Freq: Every day | ORAL | Status: DC
  Administered 2015-02-17 – 2015-02-28 (×12): 800 mg via ORAL
  Administered 2015-03-01: 400 mg via ORAL
  Administered 2015-03-02: 800 mg via ORAL
  Filled 2015-02-17 (×14): qty 2

## 2015-02-17 MED ORDER — SODIUM CHLORIDE 0.9 % IV SOLN
INTRAVENOUS | Status: DC
  Administered 2015-02-17: 1000 mL via INTRAVENOUS
  Administered 2015-02-18 – 2015-02-19 (×2): via INTRAVENOUS
  Administered 2015-02-19: 1000 mL via INTRAVENOUS
  Administered 2015-02-20 – 2015-02-22 (×3): via INTRAVENOUS

## 2015-02-17 MED ORDER — SODIUM CHLORIDE 0.9 % IJ SOLN
3.0000 mL | Freq: Two times a day (BID) | INTRAMUSCULAR | Status: DC
  Administered 2015-02-22 – 2015-02-27 (×11): 3 mL via INTRAVENOUS

## 2015-02-17 MED ORDER — CEFTRIAXONE SODIUM IN DEXTROSE 20 MG/ML IV SOLN
1.0000 g | INTRAVENOUS | Status: DC
  Administered 2015-02-17 – 2015-02-18 (×2): 1 g via INTRAVENOUS
  Filled 2015-02-17 (×3): qty 50

## 2015-02-17 MED ORDER — THYROID 60 MG PO TABS
60.0000 mg | ORAL_TABLET | Freq: Every day | ORAL | Status: DC
  Administered 2015-02-18 – 2015-03-02 (×12): 60 mg via ORAL
  Filled 2015-02-17 (×15): qty 1

## 2015-02-17 MED ORDER — ENOXAPARIN SODIUM 30 MG/0.3ML ~~LOC~~ SOLN
30.0000 mg | SUBCUTANEOUS | Status: DC
  Administered 2015-02-17 – 2015-02-26 (×9): 30 mg via SUBCUTANEOUS
  Filled 2015-02-17 (×11): qty 0.3

## 2015-02-17 MED ORDER — CYCLOSPORINE 0.05 % OP EMUL
1.0000 [drp] | Freq: Two times a day (BID) | OPHTHALMIC | Status: DC
  Administered 2015-02-17 – 2015-03-01 (×14): 1 [drp] via OPHTHALMIC
  Filled 2015-02-17 (×27): qty 1

## 2015-02-17 MED ORDER — ACETAMINOPHEN 325 MG PO TABS
650.0000 mg | ORAL_TABLET | Freq: Four times a day (QID) | ORAL | Status: DC | PRN
  Administered 2015-02-19 – 2015-02-27 (×12): 650 mg via ORAL
  Filled 2015-02-17 (×11): qty 2

## 2015-02-17 MED ORDER — FENTANYL CITRATE (PF) 100 MCG/2ML IJ SOLN: 25.0000 ug | Freq: Once | INTRAMUSCULAR | Status: DC

## 2015-02-17 MED ORDER — VITAMIN D3 25 MCG (1000 UNIT) PO TABS
5000.0000 [IU] | ORAL_TABLET | ORAL | Status: DC
Start: 2015-02-19 — End: 2015-03-02
  Administered 2015-02-19 – 2015-03-02 (×6): 5000 [IU] via ORAL
  Filled 2015-02-17 (×10): qty 5

## 2015-02-17 MED ORDER — LEVOFLOXACIN IN D5W 500 MG/100ML IV SOLN
500.0000 mg | Freq: Once | INTRAVENOUS | Status: DC
Start: 2015-02-17 — End: 2015-02-17
  Filled 2015-02-17: qty 100

## 2015-02-17 MED ORDER — LEVOFLOXACIN IN D5W 500 MG/100ML IV SOLN
500.0000 mg | Freq: Once | INTRAVENOUS | Status: AC
  Administered 2015-02-17: 500 mg via INTRAVENOUS
  Filled 2015-02-17: qty 100

## 2015-02-17 NOTE — ED Notes (Signed)
In and out not done. Accidentally click off. Pt was able to use bedpan.

## 2015-02-17 NOTE — ED Notes (Signed)
Patient transported to CT 

## 2015-02-17 NOTE — ED Provider Notes (Addendum)
Medical screening examination/treatment/procedure(s) were conducted as a shared visit with non-physician practitioner(s) and myself.  I personally evaluated the patient during the encounter.   EKG Interpretation None      Results for orders placed or performed during the hospital encounter of 02/17/15  Urinalysis, Routine w reflex microscopic (not at [facility])  Result Value Ref Range   Color, Urine AMBER (A) YELLOW   APPearance CLOUDY (A) CLEAR   Specific Gravity, Urine 1.016 1.005 - 1.030   pH 6.0 5.0 - 8.0   Glucose, UA NEGATIVE NEGATIVE mg/dL   Hgb urine dipstick SMALL (A) NEGATIVE   Bilirubin Urine NEGATIVE NEGATIVE   Ketones, ur >80 (A) NEGATIVE mg/dL   Protein, ur 30 (A) NEGATIVE mg/dL   Urobilinogen, UA 0.2 0.0 - 1.0 mg/dL   Nitrite NEGATIVE NEGATIVE   Leukocytes, UA LARGE (A) NEGATIVE  CBC with Differential/Platelet  Result Value Ref Range   WBC 9.4 4.0 - 10.5 K/uL   RBC 4.40 3.87 - 5.11 MIL/uL   Hemoglobin 14.2 12.0 - 15.0 g/dL   HCT 40.1 36.0 - 46.0 %   MCV 91.1 78.0 - 100.0 fL   MCH 32.3 26.0 - 34.0 pg   MCHC 35.4 30.0 - 36.0 g/dL   RDW 14.9 11.5 - 15.5 %   Platelets 170 150 - 400 K/uL   Neutrophils Relative % 90 (H) 43 - 77 %   Neutro Abs 8.5 (H) 1.7 - 7.7 K/uL   Lymphocytes Relative 5 (L) 12 - 46 %   Lymphs Abs 0.4 (L) 0.7 - 4.0 K/uL   Monocytes Relative 5 3 - 12 %   Monocytes Absolute 0.5 0.1 - 1.0 K/uL   Eosinophils Relative 0 0 - 5 %   Eosinophils Absolute 0.0 0.0 - 0.7 K/uL   Basophils Relative 0 0 - 1 %   Basophils Absolute 0.0 0.0 - 0.1 K/uL  Comprehensive metabolic panel  Result Value Ref Range   Sodium 132 (L) 135 - 145 mmol/L   Potassium 3.6 3.5 - 5.1 mmol/L   Chloride 90 (L) 101 - 111 mmol/L   CO2 28 22 - 32 mmol/L   Glucose, Bld 92 65 - 99 mg/dL   BUN 11 6 - 20 mg/dL   Creatinine, Ser 0.56 0.44 - 1.00 mg/dL   Calcium 8.5 (L) 8.9 - 10.3 mg/dL   Total Protein 6.7 6.5 - 8.1 g/dL   Albumin 3.3 (L) 3.5 - 5.0 g/dL   AST 44 (H) 15 - 41 U/L   ALT 38  14 - 54 U/L   Alkaline Phosphatase 129 (H) 38 - 126 U/L   Total Bilirubin 1.5 (H) 0.3 - 1.2 mg/dL   GFR calc non Af Amer >60 >60 mL/min   GFR calc Af Amer >60 >60 mL/min   Anion gap 14 5 - 15  Urine microscopic-add on  Result Value Ref Range   Squamous Epithelial / LPF RARE RARE   WBC, UA TOO NUMEROUS TO COUNT <3 WBC/hpf   RBC / HPF 7-10 <3 RBC/hpf   Bacteria, UA MANY (A) RARE   Dg Chest 1 View  02/17/2015   CLINICAL DATA:  Altered mental status today. Unable to follow instructions. Complaining of upper back pain. Confusion.  EXAM: CHEST  1 VIEW  COMPARISON:  01/19/2006.  FINDINGS: Cardiac silhouette normal in size. No mediastinal or hilar masses or evidence of adenopathy.  Lungs are clear.  No pleural effusion or pneumothorax.  Bony thorax is diffusely demineralized. There are changes from previous left  breast surgery, stable.  IMPRESSION: No acute cardiopulmonary disease.   Electronically Signed   By: Lajean Manes M.D.   On: 02/17/2015 16:00   Ct Head Wo Contrast  02/17/2015   CLINICAL DATA:  Altered mental status  EXAM: CT HEAD WITHOUT CONTRAST  TECHNIQUE: Contiguous axial images were obtained from the base of the skull through the vertex without intravenous contrast.  COMPARISON:  03/16/2009  FINDINGS: No skull fracture is noted at there is scalp swelling bilateral posterior parietal region high convexity. No intracranial hemorrhage, mass effect or midline shift. Stable cerebral atrophy. No acute cortical infarction. Stable periventricular and patchy subcortical chronic white matter disease. Mild atherosclerotic calcifications of carotid siphon. Paranasal sinuses and mastoid air cells are unremarkable.  IMPRESSION: No acute intracranial abnormality. Stable atrophy and chronic white matter disease. There is scalp swelling bilateral posterior parietal region high convexity.   Electronically Signed   By: Lahoma Crocker M.D.   On: 02/17/2015 15:39   Dg Abd 2 Views  02/01/2015   CLINICAL DATA:   Chronic constipation, abdominal distention and pain  EXAM: ABDOMEN - 2 VIEW  COMPARISON:  Abdomen film of 01/04/2015 and 03/09/2013  FINDINGS: There is a moderate to large amount of feces throughout the colon. Some gaseous distention of colon is noted which has been noted previously most likely chronic in nature. No bowel obstruction is seen. No free air is noted on the erect view. A probable small calcified fibroid is noted in the mid pelvis.  IMPRESSION: Moderate to large amount of feces throughout the colon. Probable chronic gaseous distention of bowel. No definite obstruction.   Electronically Signed   By: Ivar Drape M.D.   On: 02/01/2015 16:49    Patient with altered mental status here not clear whether that is related to the head injury that occurred or whether patient had altered mental status prior to head injury. Patient also has evidence of significant urinary tract infection. That could be causing the altered mental status. Patient will require admission. Patient is followed by equal physicians. Patient's scalp wound was repaired but only able to get to 2 staples in because she refused to rest. Patient according to family he is normally not confused like this. So this is something new and acute. Rest of workup without significant findings. No significant electrolyte abnormalities. No significant anemia or leukocytosis. Patient does urine culture pending and will be started on Anna biotics.  Fredia Sorrow, MD 02/17/15 1700   Medical screening examination/treatment/procedure(s) were conducted as a shared visit with non-physician practitioner(s) and myself.  I personally evaluated the patient during the encounter.   EKG Interpretation None       Fredia Sorrow, MD 03/01/15 [phone]

## 2015-02-17 NOTE — ED Provider Notes (Signed)
CSN: [id number]     Arrival date & time 02/17/15  1400 History   First MD Initiated Contact with Patient 02/17/15 1406     Chief Complaint  Patient presents with  . Altered Mental Status  . Fall     (Consider location/radiation/quality/duration/timing/severity/associated sxs/prior Treatment) HPI   PCP: MCNEILL,WENDY, MD Blood pressure 135/75, pulse 85, temperature 96.4 F (35.8 C), temperature source Temporal, resp. rate 16, height 5\' 7"  (1.702 m), weight 125 lb (56.7 kg), SpO2 100 %.  [name] is a 81 y.o.female with a significant PMH of cancer- breast, osteoporosis, thyroid disease, hypertension, MVP presents to the ER with complaints of unwitnessed fall, laceration to back of head to ED by EMS.  The patient is upset about being here and did not want to be transported by EMS. She reports going to the movies yesterday downtown with her cat but nothing else beyond that until Jeneen Rinks came in through her window today and saw her. Per EMS, the patient had blood down the hallway and furniture/pictures knocked off their shelves. She denies that she fell but she does not remember the incident. The pt has copious amounts of dried blood in her hair. She admits that she is prone to falls. Denies having any symptoms at this time. No headache, abdominal pains, CP, SOB, headache or confusion.  She has no family with her and no family that lives in the area.   Past Medical History  Diagnosis Date  . Cancer   . Breast cancer, left breast   . Osteoporosis   . Thyroid disease   . Hypertension   . MVP (mitral valve prolapse)    Past Surgical History  Procedure Laterality Date  . Breast lumpectomy    . Hemrrhoidectomy    . Saline implant after left mastectomy    . Esophagogastroduodenoscopy N/A 12/19/2014    Procedure: ESOPHAGOGASTRODUODENOSCOPY (EGD);  Surgeon: Teena Irani, MD;  Location: [facility];  Service: Endoscopy;  Laterality: N/A;   Family History  Problem Relation Age of Onset   . Cancer Sister     breast    History  Substance Use Topics  . Smoking status: Never Smoker   . Smokeless tobacco: Not on file  . Alcohol Use: No   OB History    No data available     Review of Systems  10 Systems reviewed and are negative for acute change except as noted in the HPI.    Allergies  Ultram and Latex  Home Medications   Prior to Admission medications   Medication Sig Start Date End Date Taking? Authorizing Provider  cholecalciferol (VITAMIN D) 1000 UNITS tablet Take 5,000 Units by mouth 3 (three) times a week.    Historical Provider, MD  Magnesium 500 MG CAPS Take 1,000 mg by mouth daily.    Historical Provider, MD  metoprolol tartrate (LOPRESSOR) 25 MG tablet Take 1 tablet (25 mg total) by mouth 2 (two) times daily. 12/22/14   Belkys A Regalado, MD  pantoprazole (PROTONIX) 40 MG tablet Take 1 tablet (40 mg total) by mouth daily. 12/22/14   Belkys A Regalado, MD  Polyethyl Glycol-Propyl Glycol (SYSTANE PRESERVATIVE FREE) 0.4-0.3 % SOLN Apply to eye.    Historical Provider, MD  RESTASIS 0.05 % ophthalmic emulsion Place 1 drop into both eyes 2 (two) times daily as needed.  10/17/13   Historical Provider, MD  sorbitol 70 % solution Take 15 mLs by mouth daily as needed. 12/22/14   Elmarie Shiley, MD  thyroid (  ARMOUR THYROID) 60 MG tablet Take 1 tablet (60 mg total) by mouth daily before breakfast. 12/22/14   Belkys A Regalado, MD   BP 135/75 mmHg  Pulse 85  Temp(Src) 96.4 F (35.8 C) (Temporal)  Resp 16  Ht 5\' 7"  (1.702 m)  Wt 125 lb (56.7 kg)  BMI 19.57 kg/m2  SpO2 100% Physical Exam  Constitutional: She appears well-developed and well-nourished. No distress.  HENT:  Head: Normocephalic and atraumatic.    Eyes: Pupils are equal, round, and reactive to light.  Neck: Normal range of motion. Neck supple.  Cardiovascular: Normal rate and regular rhythm.   Pulmonary/Chest: Effort normal.  Abdominal: Soft.  Musculoskeletal:  Symmetrical and physiologic strength  and ROM without pain to bilateral upper and lower extremities.  Neurological: She is alert.  Pt alert to person, place and time.  She does exhibit some confusion with recent evens of the past week.   Skin: Skin is warm and dry.  Nursing note and vitals reviewed.   ED Course  Procedures (including critical care time) Labs Review Labs Reviewed  URINALYSIS, ROUTINE W REFLEX MICROSCOPIC (NOT AT [facility])  CBC WITH DIFFERENTIAL/PLATELET  COMPREHENSIVE METABOLIC PANEL    Imaging Review No results found.   EKG Interpretation None      MDM   Final diagnoses:  Altered mental status  Altered mental status   Patient is declining repair of wound and does not want admission. She appears to be confused - unclear if this is due to UTI, head injury or dementia. No family members are here for clarification on this but daughter did call and she felt like the confusion is not her normal. Due to confusion and no PMH in system of dementia I assume patient is confused and will apply staples to scalp. Dr. Rogene Houston has seen patient and agrees- pt will need admission and IV abx.  LACERATION REPAIR Performed by: Linus Mako Authorized by: Linus Mako Consent: Verbal consent obtained. Risks and benefits: risks, benefits and alternatives were discussed Consent given by: patient Patient identity confirmed: provided demographic data Prepped and Draped in normal sterile fashion Wound explored  Laceration Location: posterior scalp  Laceration Length: 3 cm  No Foreign Bodies seen or palpated  Skin closure: staples  Number of sutures: 2  Technique: staples  Patient tolerance: Patient tolerated the procedure well with no immediate complications.   I spoke with [facility] Kinnie Feil, MD. Holding orders placed inpatient, medsurg, [facility] admits.   Medications  levofloxacin (LEVAQUIN) IVPB 500 mg (not administered)    Urine culture added on as well as blood  cultures.    Delos Haring, PA-C 02/17/15 1715

## 2015-02-17 NOTE — H&P (Addendum)
[facility] History and Physical  [name] CBU:[id number] DOB: 04/28/1927 DOA: 02/17/2015  Referring physician:  PCP: Cari Caraway, MD  Specialists:   Chief Complaint: fall   HPI: [name] is a 81 y.o. female with PMH of HTN, Hypothyroidism, Aortic stenosis, PAF (not on anticoagulation due to GIB) presented with unwitnessed fall, laceration to back of head. Patient lives alone, could not remember the fall. Per EMS: patient had blood down the hallway and furniture/pictures knocked off their shelves. Patient denies focal weakness, no paraesthesia, denies cough, no chest pains. She reports dysuria for several days, found to have UTI -E: posterior scalp laceration stapled in ED  Review of Systems: The patient denies anorexia, fever, weight loss,, vision loss, decreased hearing, hoarseness, chest pain, syncope, dyspnea on exertion, peripheral edema, balance deficits, hemoptysis, abdominal pain, melena, hematochezia, severe indigestion/heartburn, hematuria, incontinence, genital sores, muscle weakness, suspicious skin lesions, transient blindness, difficulty walking, depression, unusual weight change, abnormal bleeding, enlarged lymph nodes, angioedema, and breast masses.    Past Medical History  Diagnosis Date  . Cancer   . Breast cancer, left breast   . Osteoporosis   . Thyroid disease   . Hypertension   . MVP (mitral valve prolapse)    Past Surgical History  Procedure Laterality Date  . Breast lumpectomy    . Hemrrhoidectomy    . Saline implant after left mastectomy    . Esophagogastroduodenoscopy N/A 12/19/2014    Procedure: ESOPHAGOGASTRODUODENOSCOPY (EGD);  Surgeon: Teena Irani, MD;  Location: [facility];  Service: Endoscopy;  Laterality: N/A;   Social History:  reports that she has never smoked. She does not have any smokeless tobacco history on file. She reports that she does not drink alcohol or use illicit drugs. Home;  where does patient live--home, ALF, SNF?  and with whom if at home? No; Can patient participate in ADLs?  Allergies  Allergen Reactions  . Ultram [Tramadol Hcl] Nausea Only  . Latex Rash    Skin rash with bandaids and tapes    Family History  Problem Relation Age of Onset  . Cancer Sister     breast     (be sure to complete)  Prior to Admission medications   Medication Sig Start Date End Date Taking? Authorizing Provider  amoxicillin (AMOXIL) 500 MG capsule Take 2,000 mg by mouth See admin instructions. Take 4 capsules (2000 mg) by mouth 1 hour before dental appointment 11/20/14  Yes Historical Provider, MD  lubiprostone (AMITIZA) 24 MCG capsule Take 24 mcg by mouth 2 (two) times daily with a meal. Take with milk   Yes Historical Provider, MD  thyroid (ARMOUR) 30 MG tablet Take 60 mg by mouth daily before breakfast. Take 30 minutes prior to food   Yes Historical Provider, MD  cholecalciferol (VITAMIN D) 1000 UNITS tablet Take 5,000 Units by mouth 3 (three) times a week.    Historical Provider, MD  Magnesium 500 MG CAPS Take 1,000 mg by mouth daily.    Historical Provider, MD  metoprolol tartrate (LOPRESSOR) 25 MG tablet Take 1 tablet (25 mg total) by mouth 2 (two) times daily. 12/22/14   Belkys A Regalado, MD  pantoprazole (PROTONIX) 40 MG tablet Take 1 tablet (40 mg total) by mouth daily. 12/22/14   Belkys A Regalado, MD  Polyethyl Glycol-Propyl Glycol (SYSTANE PRESERVATIVE FREE) 0.4-0.3 % SOLN Apply to eye.    Historical Provider, MD  RESTASIS 0.05 % ophthalmic emulsion Place 1 drop into both eyes 2 (two) times daily as needed.  10/17/13   Historical Provider, MD  sorbitol 70 % solution Take 15 mLs by mouth daily as needed. 12/22/14   Belkys A Regalado, MD  thyroid (ARMOUR THYROID) 60 MG tablet Take 1 tablet (60 mg total) by mouth daily before breakfast. Patient not taking: Reported on 02/17/2015 12/22/14   Elmarie Shiley, MD   Physical Exam: Filed Vitals:   02/17/15 1717  BP: 127/78  Pulse: 98  Temp:   Resp: 16      General:  Alert, oriented   Eyes: EOM-I  ENT: no oral ulcers   Neck: supple   Cardiovascular: s1,s2 rrr  Respiratory: CTA BL  Abdomen: soft, nt,nd   Skin: scalp laceration, ecchymosis   Musculoskeletal: no leg edema  Psychiatric: no hallucinations   Neurologic: CN 2-12 intact. Motor 2-12 intact motor 5/5 bl  Labs on Admission:  Basic Metabolic Panel:  Recent Labs Lab 02/17/15 1441  NA 132*  K 3.6  CL 90*  CO2 28  GLUCOSE 92  BUN 11  CREATININE 0.56  CALCIUM 8.5*   Liver Function Tests:  Recent Labs Lab 02/17/15 1441  AST 44*  ALT 38  ALKPHOS 129*  BILITOT 1.5*  PROT 6.7  ALBUMIN 3.3*   No results for input(s): LIPASE, AMYLASE in the last 168 hours. No results for input(s): AMMONIA in the last 168 hours. CBC:  Recent Labs Lab 02/17/15 1441  WBC 9.4  NEUTROABS 8.5*  HGB 14.2  HCT 40.1  MCV 91.1  PLT 170   Cardiac Enzymes: No results for input(s): CKTOTAL, CKMB, CKMBINDEX, TROPONINI in the last 168 hours.  BNP (last 3 results) No results for input(s): BNP in the last 8760 hours.  ProBNP (last 3 results) No results for input(s): PROBNP in the last 8760 hours.  CBG: No results for input(s): GLUCAP in the last 168 hours.  Radiological Exams on Admission: Dg Chest 1 View  02/17/2015   CLINICAL DATA:  Altered mental status today. Unable to follow instructions. Complaining of upper back pain. Confusion.  EXAM: CHEST  1 VIEW  COMPARISON:  01/19/2006.  FINDINGS: Cardiac silhouette normal in size. No mediastinal or hilar masses or evidence of adenopathy.  Lungs are clear.  No pleural effusion or pneumothorax.  Bony thorax is diffusely demineralized. There are changes from previous left breast surgery, stable.  IMPRESSION: No acute cardiopulmonary disease.   Electronically Signed   By: Lajean Manes M.D.   On: 02/17/2015 16:00   Ct Head Wo Contrast  02/17/2015   CLINICAL DATA:  Altered mental status  EXAM: CT HEAD WITHOUT CONTRAST  TECHNIQUE:  Contiguous axial images were obtained from the base of the skull through the vertex without intravenous contrast.  COMPARISON:  03/16/2009  FINDINGS: No skull fracture is noted at there is scalp swelling bilateral posterior parietal region high convexity. No intracranial hemorrhage, mass effect or midline shift. Stable cerebral atrophy. No acute cortical infarction. Stable periventricular and patchy subcortical chronic white matter disease. Mild atherosclerotic calcifications of carotid siphon. Paranasal sinuses and mastoid air cells are unremarkable.  IMPRESSION: No acute intracranial abnormality. Stable atrophy and chronic white matter disease. There is scalp swelling bilateral posterior parietal region high convexity.   Electronically Signed   By: Lahoma Crocker M.D.   On: 02/17/2015 15:39    EKG: Independently reviewed.   Assessment/Plan Active Problems:   Altered mental state   UTI (lower urinary tract infection)   81 y.o. female with PMH of HTN, Hypothyroidism, Aortic stenosis, PAF (not on anticoagulation due  to GIB) presented with unwitnessed fall, laceration to back of head. Pt found to have UTI  1. Fall suspected due to UTI. Neuro exam is non focal. CT head: no acute findings. Scalp laceration is stapled in ED -treat UTI, obtain PT/OT eval. Monitor on tele 24 hrs.    2. UTI. Started on IV atx. Obtain cultures 3. PAF. Not on anticoagulation due to GIB, fall risk. HR is stable. Cont BB. Monitor on tele 24 hrs 4. Aortic stenosis. Echo. LVEF: 60-65%. Mean gradient 23 mmHg. VTI 1.08 cm.  5. HTN. Stable on BB. 6. Probable MCI. Reported confusion prior to admission likely due to UTI. Currently mental status at baseline   Patient with recurrent falls, likely unable to live alone at home. Will ask SW eval for ALF to discuss with patient   D/w patient, confirmed. Pt is DNR   None.  if consultant consulted, please document name and whether formally or informally consulted  Code Status: DNR  (must indicate code status--if unknown or must be presumed, indicate so) Family Communication: d/w patient, her sister  (indicate person spoken with, if applicable, with phone number if by telephone) Disposition Plan: pend PT eval. ? Needs placement  (indicate anticipated LOS)  Time spent: >45 minutes   Kinnie Feil [facility] Pager [phone]  If 7PM-7AM, please contact night-coverage [email or web address] Password [facility] 02/17/2015, 5:35 PM

## 2015-02-17 NOTE — ED Notes (Signed)
Pt arrived by [facility] from home alone with c/o possible fall and altered mental status. Pt friend has not seen her since Thursday and has not been able to get in touch were her so he drove over there and found her on the floor in urine and dry blood from head. EMS stated that pt was laying on floor approximately 10 feet away from walker and there was smeared blood from walker to where patient was lying. There is a hematoma to the back side of head and no bleeding noted at this time other than the dried blood. Pt is alert to person and time but stated that she was at the movies and denies falling. Pt has no complaints. Pt placed in C-collar on scene.

## 2015-02-18 DIAGNOSIS — G92 Toxic encephalopathy: Secondary | ICD-10-CM

## 2015-02-18 DIAGNOSIS — I350 Nonrheumatic aortic (valve) stenosis: Secondary | ICD-10-CM

## 2015-02-18 DIAGNOSIS — N10 Acute tubulo-interstitial nephritis: Secondary | ICD-10-CM

## 2015-02-18 DIAGNOSIS — R404 Transient alteration of awareness: Secondary | ICD-10-CM

## 2015-02-18 NOTE — Progress Notes (Signed)
CRITICAL VALUE ALERT  Critical value received:  Right forearm blood cx gram positive cocci in clusters  Date of notification:  02/18/15  Time of notification:  17:27  Critical value read back: yes  Nurse who received alert:  Doreene Burke  MD notified (1st page):  Dr. [name]  Time of first page:  17:35  MD notified (2nd page):  Time of second page:  Responding MD:  Dr. [name]  Time MD responded:  18:00

## 2015-02-18 NOTE — Evaluation (Signed)
Physical Therapy Evaluation Patient Details Name: [name] MRN: [id number] DOB: 1927/07/09 Today's Date: 02/18/2015   History of Present Illness  [name] is a 81 y.o. female with PMH of HTN, Hypothyroidism, Aortic stenosis, PAF (not on anticoagulation due to GIB) presented with unwitnessed fall, laceration to back of head. Patient lives alone, could not remember the fall. Per EMS: patient had blood down the hallway and furniture/pictures knocked off their shelves.  Clinical Impression   Pt admitted with above diagnosis. Pt currently with functional limitations due to the deficits listed below (see PT Problem List).  Pt will benefit from skilled PT to increase their independence and safety with mobility to allow discharge to the venue listed below.    It seems [name] has been to SNF for rehab in the past, rehabbed up to go home, gets home, then has difficulty managing; Is it time to look at longer term living situations for her, like ALF or independent living apartment in retirement community?    Follow Up Recommendations SNF;Supervision/Assistance - 24 hour    Equipment Recommendations  Rolling walker with 5" wheels;3in1 (PT)    Recommendations for Other Services OT consult     Precautions / Restrictions Precautions Precautions: Fall      Mobility  Bed Mobility Overal bed mobility: Needs Assistance Bed Mobility: Supine to Sit     Supine to sit: Min assist;HOB elevated     General bed mobility comments: Slow moving, min assist to pull to fully upright sitting; used bed pad to square off hips  Transfers Overall transfer level: Needs assistance Equipment used: 1 person hand held assist Transfers: Sit to/from [facility] Sit to Stand: Mod assist Stand pivot transfers: Mod assist       General transfer comment: Heavy mod assist to reach standing with significant L lean; pivot steps to EOB with heavy mod assist for balance  Ambulation/Gait              General Gait Details: Not tested today, given L lean; and once standing, pt stated she wasn't sure she can walk today  Stairs            Wheelchair Mobility    Modified Rankin (Stroke Patients Only)       Balance Overall balance assessment: Needs assistance   Sitting balance-Leahy Scale: Poor       Standing balance-Leahy Scale: Zero                               Pertinent Vitals/Pain Pain Assessment: Faces Faces Pain Scale: Hurts little more Pain Location: Headache Pain Descriptors / Indicators: Aching;Headache Pain Intervention(s): Monitored during session    Home Living Family/patient expects to be discharged to:: Private residence Living Arrangements: Alone Available Help at Discharge: Neighbor Type of Home: House Home Access: Stairs to enter   Technical brewer of Steps: 1 Home Layout: One level Home Equipment: Cane - single point;Walker - 2 wheels;Bedside commode;Shower seat      Prior Function Level of Independence: Independent with assistive device(s)         Comments: Per chart, it seems she was not managing well at home independently     Hand Dominance   Dominant Hand: Right    Extremity/Trunk Assessment   Upper Extremity Assessment: Generalized weakness           Lower Extremity Assessment: Generalized weakness      Cervical / Trunk  Assessment: Kyphotic;Other exceptions  Communication   Communication: No difficulties  Cognition Arousal/Alertness: Awake/alert Behavior During Therapy: WFL for tasks assessed/performed;Restless (perseverative re: location of her cane and pocketbood) Overall Cognitive Status: Impaired/Different from baseline Area of Impairment: Orientation Orientation Level: Disoriented to;Place;Time;Situation   Memory: Decreased short-term memory         General Comments: Sporadically refers to checking other rooms in her home for her cane and purse    General Comments       Exercises        Assessment/Plan    PT Assessment Patient needs continued PT services  PT Diagnosis Difficulty walking;Acute pain;Generalized weakness   PT Problem List Decreased strength;Decreased range of motion;Decreased activity tolerance;Decreased balance;Decreased mobility;Decreased coordination;Decreased cognition;Decreased knowledge of use of DME;Decreased safety awareness;Pain;Decreased knowledge of precautions  PT Treatment Interventions DME instruction;Gait training;Stair training;Functional mobility training;Therapeutic activities;Therapeutic exercise;Cognitive remediation;Patient/family education;Balance training   PT Goals (Current goals can be found in the Care Plan section) Acute Rehab PT Goals Patient Stated Goal: wants to be independent PT Goal Formulation: With patient Time For Goal Achievement: 03/04/15 Potential to Achieve Goals: Good    Frequency Min 3X/week   Barriers to discharge Decreased caregiver support      Co-evaluation               End of Session Equipment Utilized During Treatment: Gait belt Activity Tolerance: Patient tolerated treatment well Patient left: in chair;with call bell/phone within reach;with chair alarm set Nurse Communication: Mobility status         Time: 8502-7741 PT Time Calculation (min) (ACUTE ONLY): 17 min   Charges:   PT Evaluation $Initial PT Evaluation Tier I: 1 Procedure     PT G CodesQuin Hoop 02/18/2015, 1:46 PM  Roney Marion, [place] Pager [phone] Office [phone]

## 2015-02-18 NOTE — Clinical Social Work Note (Signed)
CSW consulted for possible SNF placement at time of discharge. CSW now following and will assist with appropriate discharge planning needs.  Lubertha Sayres, [place] Clinical Social Work Department Orthopedics [phone]) and Surgical [phone])

## 2015-02-18 NOTE — Progress Notes (Signed)
[name]:[id number] DOB: [birth year] DOA: 02/17/2015 PCP: Cari Caraway, MD  Brief narrative: 81 y/o ? recent admission 3.28-->12/22/14 Antral gastritis 2/2 Aleve, Chr Afib [place] score 4 not on AC 2/2 GIB, Hyponatremia, remote h/o breast cancer, prior severe fall resulting epistaxis + facial trauma 02/2009, prior R biceps + supraspinatus tears admitted overnight 06/11/09 with metabolic encephalopathy and accidental fall. Allegedly blood was found down her hallway furniture and pieces of pictures were knocked off the shelves-posterior scalp laceration was stapled in the emergency room.  Past medical history-As per Problem list Chart reviewed as below- Reviewed  Consultants:  Reviewed  Procedures:  None  Antibiotics:  IV ceftriaxone 5/28  Urine culture pending   Subjective   Alert and reports that she fell she was "dancing" Tells me someone else was dancing with her apparently-I'm not sure what to believe-patient lives alone. Able to orient to place and person [place] state Can tell me the year and the month   Objective    Interim History:   Telemetry: Sinus rhythm and PVCs    Objective: Filed Vitals:   02/17/15 1717 02/17/15 1800 02/17/15 1900 02/18/15 0515  BP: 127/78 122/79 116/79 127/69  Pulse: 98  65 79  Temp:   97.5 F (36.4 C) 98 F (36.7 C)  TempSrc:   Oral Oral  Resp: 16  17 15   Height:   5\' 7"  (1.702 m)   Weight:   47 kg (103 lb 9.9 oz)   SpO2: 97%  92% 100%    Intake/Output Summary (Last 24 hours) at 02/18/15 0901 Last data filed at 02/18/15 0600  Gross per 24 hour  Intake    240 ml  Output      0 ml  Net    240 ml    Exam:   external ocular movements intact, Range of motion of neck intact with no JVD Stitches on the back of the head/sutures appear intact and clean with mild oozing Moderate dentition S1-S2 grade 4/6 murmur best heard right upper sternal edge Chest clear Abdomen soft nontender No excoriations or lower extremity  swelling Neurologically is intact without focal deficit however mildly confused  Data Reviewed: Basic Metabolic Panel:  Recent Labs Lab 02/17/15 1441  NA 132*  K 3.6  CL 90*  CO2 28  GLUCOSE 92  BUN 11  CREATININE 0.56  CALCIUM 8.5*   Liver Function Tests:  Recent Labs Lab 02/17/15 1441  AST 44*  ALT 38  ALKPHOS 129*  BILITOT 1.5*  PROT 6.7  ALBUMIN 3.3*   No results for input(s): LIPASE, AMYLASE in the last 168 hours. No results for input(s): AMMONIA in the last 168 hours. CBC:  Recent Labs Lab 02/17/15 1441  WBC 9.4  NEUTROABS 8.5*  HGB 14.2  HCT 40.1  MCV 91.1  PLT 170   Cardiac Enzymes: No results for input(s): CKTOTAL, CKMB, CKMBINDEX, TROPONINI in the last 168 hours. BNP: Invalid input(s): POCBNP CBG: No results for input(s): GLUCAP in the last 168 hours.  No results found for this or any previous visit (from the past 240 hour(s)).   Studies:              All Imaging reviewed and is as per above notation   Scheduled Meds: . cefTRIAXone (ROCEPHIN)  IV  1 g Intravenous Q24H  . [START ON 02/19/2015] cholecalciferol  5,000 Units Oral Once per day on Mon Wed Fri  . cycloSPORINE  1 drop Both Eyes BID  . enoxaparin (LOVENOX) injection  30 mg Subcutaneous Q24H  . fentaNYL (SUBLIMAZE) injection  25 mcg Intravenous Once  . lubiprostone  24 mcg Oral BID WC  . magnesium oxide  800 mg Oral Daily  . metoprolol tartrate  25 mg Oral BID  . pantoprazole  40 mg Oral Daily  . sodium chloride  3 mL Intravenous Q12H  . thyroid  60 mg Oral QAC breakfast   Continuous Infusions: . sodium chloride 75 mL/hr at 02/18/15 0841     Assessment/Plan:  1. Fall? Cause-keep on telemetry, get orthostatics-patient is on a beta blocker. She tells me this is not her first fall and we will ask therapy to see her in consult.  She also tells me she has "a lot of things to do and doctors to see". I have explained to her clearly that her hospitalization is more important than  seeing an outpatient physician at this point in time 2. Pyelonephritis-continue Rocephin, follow urine culture done 5/28 units CBC plus differential in a.m. 3. Toxic metabolic encephalopathy? Secondary to pyelonephritis versus mild to moderate dementia-monitor 4. Aortic stenosis-her valve area is 1.08. Her peak gradient is about 40.  She is preload dependent and is on a beta blocker--these 2 issues together might have precipitated a fall as well.  She might require cardiology input this admission to determine if she is a candidate for TAVR.  We will arrange for consult-I would not repeat echocardiogram at this time 5. Paroxysmal atrial fibrillation, [place] score 4-not on anticoagulation because of GI bleed, falls. Continue metoprolol 25 twice a day-currently rate controlled. 6. Remote history of breast cancer-outpatient surveillance 7. Prior gastrointestinal bleed 12/2014-hemoglobin stable  Code Status: Full Family Communication: AnnCurry [phone]) called-no answer-Left VM 5/29 Disposition Plan: inpatient   Verneita Griffes, MD  [facility] Pager [phone] 02/18/2015, 9:01 AM    LOS: 1 day

## 2015-02-19 DIAGNOSIS — W1809XS Striking against other object with subsequent fall, sequela: Secondary | ICD-10-CM

## 2015-02-19 LAB — URINE CULTURE: Colony Count: >=100000

## 2015-02-19 LAB — CULTURE, BLOOD (ROUTINE X 2)

## 2015-02-19 LAB — BASIC METABOLIC PANEL
ANION GAP: 9 (ref 5–15)
BUN: 14 mg/dL (ref 6–20)
CO2: 27 mmol/L (ref 22–32)
Calcium: 8.1 mg/dL — ABNORMAL LOW (ref 8.9–10.3)
Chloride: 96 mmol/L — ABNORMAL LOW (ref 101–111)
Creatinine, Ser: 0.53 mg/dL (ref 0.44–1.00)
GFR calc Af Amer: >60 mL/min (ref >60)
GFR calc non Af Amer: >60 mL/min (ref >60)
Glucose, Bld: 84 mg/dL (ref 65–99)
Potassium: 3.2 mmol/L — ABNORMAL LOW (ref 3.5–5.1)
Sodium: 132 mmol/L — ABNORMAL LOW (ref 135–145)

## 2015-02-19 MED ORDER — FLEET ENEMA 7-19 GM/118ML RE ENEM
1.0000 | ENEMA | Freq: Once | RECTAL | Status: AC
  Administered 2015-02-19: 1 via RECTAL
  Filled 2015-02-19: qty 1

## 2015-02-19 MED ORDER — AMOXICILLIN-POT CLAVULANATE 875-125 MG PO TABS
1.0000 | ORAL_TABLET | Freq: Two times a day (BID) | ORAL | Status: DC
  Administered 2015-02-19 (×2): 1 via ORAL
  Filled 2015-02-19 (×4): qty 1

## 2015-02-19 NOTE — Progress Notes (Signed)
[name] EXH:[id number] DOB: Jan 22, 1927 DOA: 02/17/2015 PCP: Cari Caraway, MD  Brief narrative: 81 y/o ? recent admission 3.28-->12/22/14 Antral gastritis 2/2 Aleve, Chr Afib [place] score 4 not on AC 2/2 GIB, Hyponatremia, remote h/o breast cancer, prior severe fall resulting epistaxis + facial trauma 02/2009, prior R biceps + supraspinatus tears admitted overnight 3/81/01 with metabolic encephalopathy and accidental fall. Allegedly blood was found down her hallway furniture and pieces of pictures were knocked off the shelves-posterior scalp laceration was stapled in the emergency room. Found to have Enterococcal Pyelo  Past medical history-As per Problem list Chart reviewed as below- Reviewed  Consultants:  Reviewed  Procedures:  None  Antibiotics:  IV ceftriaxone 5/28  Urine culture pending   Subjective   Much more oriented Insistent on going home tol diet Unhappy that she has to stay in hospital for Urine speciation    Objective    Interim History:   Telemetry: Sinus rhythm and PVCs    Objective: Filed Vitals:   02/18/15 1609 02/18/15 2104 02/19/15 0537 02/19/15 0929  BP: 125/73 131/83 132/72 116/66  Pulse: 79 81 79 79  Temp: 98.2 F (36.8 C) 97.5 F (36.4 C) 97.3 F (36.3 C) 97.6 F (36.4 C)  TempSrc: Oral Oral Oral Oral  Resp: 18 14 18 18   Height:      Weight:      SpO2: 99% 100% 99% 98%    Intake/Output Summary (Last 24 hours) at 02/19/15 1220 Last data filed at 02/19/15 0900  Gross per 24 hour  Intake   2100 ml  Output    250 ml  Net   1850 ml    Exam:   external ocular movements intact, Range of motion of neck intact with no JVD Stitches on the back of the head/sutures appear intact and clean with mild oozing Moderate dentition S1-S2 grade 4/6 murmur best heard right upper sternal edge Chest clear Abdomen soft nontender No excoriations or lower extremity swelling Neurologically is intact without focal deficit however mildly  confused  Data Reviewed: Basic Metabolic Panel:  Recent Labs Lab 02/17/15 1441 02/19/15 0600  NA 132* 132*  K 3.6 3.2*  CL 90* 96*  CO2 28 27  GLUCOSE 92 84  BUN 11 14  CREATININE 0.56 0.53  CALCIUM 8.5* 8.1*   Liver Function Tests:  Recent Labs Lab 02/17/15 1441  AST 44*  ALT 38  ALKPHOS 129*  BILITOT 1.5*  PROT 6.7  ALBUMIN 3.3*   No results for input(s): LIPASE, AMYLASE in the last 168 hours. No results for input(s): AMMONIA in the last 168 hours. CBC:  Recent Labs Lab 02/17/15 1441  WBC 9.4  NEUTROABS 8.5*  HGB 14.2  HCT 40.1  MCV 91.1  PLT 170   Cardiac Enzymes: No results for input(s): CKTOTAL, CKMB, CKMBINDEX, TROPONINI in the last 168 hours. BNP: Invalid input(s): POCBNP CBG: No results for input(s): GLUCAP in the last 168 hours.  Recent Results (from the past 240 hour(s))  Urine culture     Status: None (Preliminary result)   Collection Time: 02/17/15  3:08 PM  Result Value Ref Range Status   Specimen Description URINE, RANDOM  Final   Special Requests NONE  Final   Colony Count   Final    >=100,000 COLONIES/ML Performed at [facility]    Culture   Final    ENTEROCOCCUS SPECIES Performed at [facility]    Report Status PENDING  Incomplete  Culture, blood (routine x 2)  Status: None   Collection Time: 02/17/15  6:21 PM  Result Value Ref Range Status   Specimen Description BLOOD BLOOD RIGHT FOREARM  Final   Special Requests BOTTLES DRAWN AEROBIC AND ANAEROBIC 4 CC  Final   Culture   Final    STAPHYLOCOCCUS SPECIES (COAGULASE NEGATIVE) Note: THE SIGNIFICANCE OF ISOLATING THIS ORGANISM FROM A SINGLE SET OF BLOOD CULTURES WHEN MULTIPLE SETS ARE DRAWN IS UNCERTAIN. PLEASE NOTIFY THE MICROBIOLOGY DEPARTMENT WITHIN ONE WEEK IF SPECIATION AND SENSITIVITIES ARE REQUIRED. Note: Gram Stain Report Called to,Read Back By and Verified With: REBECCA SPARKS 5.29.16  57PM  [place] Performed at [facility]    Report  Status 02/19/2015 FINAL  Final  Culture, blood (routine x 2)     Status: None (Preliminary result)   Collection Time: 02/17/15  6:28 PM  Result Value Ref Range Status   Specimen Description BLOOD RIGHT WRIST  Final   Special Requests BOTTLES DRAWN AEROBIC AND ANAEROBIC 3 CC  Final   Culture   Final           BLOOD CULTURE RECEIVED NO GROWTH TO DATE CULTURE WILL BE HELD FOR 5 DAYS BEFORE ISSUING A FINAL NEGATIVE REPORT Performed at [facility]    Report Status PENDING  Incomplete     Studies:              All Imaging reviewed and is as per above notation   Scheduled Meds: . cefTRIAXone (ROCEPHIN)  IV  1 g Intravenous Q24H  . cholecalciferol  5,000 Units Oral Once per day on Mon Wed Fri  . cycloSPORINE  1 drop Both Eyes BID  . enoxaparin (LOVENOX) injection  30 mg Subcutaneous Q24H  . fentaNYL (SUBLIMAZE) injection  25 mcg Intravenous Once  . lubiprostone  24 mcg Oral BID WC  . magnesium oxide  800 mg Oral Daily  . metoprolol tartrate  25 mg Oral BID  . pantoprazole  40 mg Oral Daily  . sodium chloride  3 mL Intravenous Q12H  . thyroid  60 mg Oral QAC breakfast   Continuous Infusions: . sodium chloride 1,000 mL (02/19/15 4599)     Assessment/Plan:  1. Fall? Cause-keep on telemetry, get orthostatics-patient is on a beta blocker. She tells me this is not her first fall but refuses SNF placement. 2. Pyelonephritis 2/2 to Enterococcus-C/S pending- Rocephin-->zosyn, follow urine culture done 5/28 CBC plus differential in a.m.--She is not ready for d/c yet 3. Toxic metabolic encephalopathy? Secondary to pyelonephritis versus mild to moderate dementia-monitor 4. Aortic stenosis-her valve area is 1.08. Her peak gradient is about 40.  She is preload dependent and is on a beta blocker--these 2 issues together might have precipitated a fall as well.   We will arrange for consult as OP- requires cardiology input  to determine if she is a candidate for TAVR.I 5. Paroxysmal atrial  fibrillation, [place] score 4-not on anticoagulation because of GI bleed, falls. Continue metoprolol 25 twice a day-currently rate controlled. 6. Remote history of breast cancer-outpatient surveillance 7. Prior gastrointestinal bleed 12/2014-hemoglobin stable  Code Status: Full Family Communication: no family-patient coherent and understands plan Disposition Plan: inpatient   Verneita Griffes, MD  [facility] Pager [phone] 02/19/2015, 12:20 PM    LOS: 2 days

## 2015-02-19 NOTE — Progress Notes (Signed)
Patient requesting a fleets enema.  MD notified.

## 2015-02-20 ENCOUNTER — Inpatient Hospital Stay (HOSPITAL_COMMUNITY): Payer: Medicare Other

## 2015-02-20 ENCOUNTER — Inpatient Hospital Stay: Admission: RE | Admit: 2015-02-20 | Payer: Medicare Other | Source: Ambulatory Visit

## 2015-02-20 LAB — COMPREHENSIVE METABOLIC PANEL
ALK PHOS: 99 U/L (ref 38–126)
ALT: 27 U/L (ref 14–54)
AST: 23 U/L (ref 15–41)
Albumin: 2.8 g/dL — ABNORMAL LOW (ref 3.5–5.0)
Anion gap: 11 (ref 5–15)
BUN: 11 mg/dL (ref 6–20)
CALCIUM: 7.9 mg/dL — AB (ref 8.9–10.3)
CO2: 26 mmol/L (ref 22–32)
Chloride: 97 mmol/L — ABNORMAL LOW (ref 101–111)
Creatinine, Ser: 0.35 mg/dL — ABNORMAL LOW (ref 0.44–1.00)
Glucose, Bld: 97 mg/dL (ref 65–99)
Potassium: 2.5 mmol/L — CL (ref 3.5–5.1)
Sodium: 134 mmol/L — ABNORMAL LOW (ref 135–145)
Total Bilirubin: 1.3 mg/dL — ABNORMAL HIGH (ref 0.3–1.2)
Total Protein: 5.1 g/dL — ABNORMAL LOW (ref 6.5–8.1)

## 2015-02-20 LAB — GLUCOSE, CAPILLARY: Glucose-Capillary: 100 mg/dL — ABNORMAL HIGH (ref 65–99)

## 2015-02-20 LAB — AMMONIA: AMMONIA: 13 umol/L (ref 9–35)

## 2015-02-20 MED ORDER — POTASSIUM CHLORIDE CRYS ER 20 MEQ PO TBCR
40.0000 meq | EXTENDED_RELEASE_TABLET | Freq: Two times a day (BID) | ORAL | Status: DC
  Administered 2015-02-20 – 2015-02-21 (×3): 40 meq via ORAL
  Filled 2015-02-20 (×4): qty 2

## 2015-02-20 MED ORDER — AMOXICILLIN-POT CLAVULANATE ER 1000-62.5 MG PO TB12: 2.0000 | ORAL_TABLET | Freq: Two times a day (BID) | ORAL | Status: DC

## 2015-02-20 MED ORDER — SODIUM CHLORIDE 0.9 % IV BOLUS (SEPSIS)
500.0000 mL | Freq: Once | INTRAVENOUS | Status: AC
  Administered 2015-02-20: 500 mL via INTRAVENOUS

## 2015-02-20 MED ORDER — AMOXICILLIN-POT CLAVULANATE 875-125 MG PO TABS
1.0000 | ORAL_TABLET | Freq: Two times a day (BID) | ORAL | Status: AC
  Administered 2015-02-20 – 2015-02-23 (×7): 1 via ORAL
  Filled 2015-02-20 (×7): qty 1

## 2015-02-20 MED ORDER — AMOXICILLIN 500 MG PO CAPS: 500.0000 mg | ORAL_CAPSULE | Freq: Two times a day (BID) | ORAL | Status: DC

## 2015-02-20 MED ORDER — AMOXICILLIN 500 MG PO CAPS
500.0000 mg | ORAL_CAPSULE | Freq: Two times a day (BID) | ORAL | Status: DC
  Administered 2015-02-20: 500 mg via ORAL
  Filled 2015-02-20 (×2): qty 1

## 2015-02-20 MED ORDER — SORBITOL 70 % SOLN
960.0000 mL | TOPICAL_OIL | Freq: Once | ORAL | Status: AC
  Administered 2015-02-21: 960 mL via RECTAL
  Filled 2015-02-20: qty 240

## 2015-02-20 MED ORDER — NITROFURANTOIN MONOHYD MACRO 100 MG PO CAPS: 100.0000 mg | ORAL_CAPSULE | Freq: Two times a day (BID) | ORAL | Status: DC

## 2015-02-20 NOTE — Progress Notes (Signed)
Addend AXR shows sigmoid ileus-will give SMOG Also ASP PNa likely so ge tSLP and  Abx to augmentin Patient in my opinion doesn;t have capacity and psych will see D/c cancelled for the moment and will need resolution of acute issues prior to d/c home  Verneita Griffes, MD [facility] (P) [phone]

## 2015-02-20 NOTE — Progress Notes (Signed)
[name] WFU:[id number] DOB: 03/28/1927 DOA: 02/17/2015 PCP: Cari Caraway, MD  Brief narrative: 81 y/o ? recent admission 3.28-->12/22/14 Antral gastritis 2/2 Aleve, Chr Afib [place] score 4 not on AC 2/2 GIB, Hyponatremia, remote h/o breast cancer, prior severe fall resulting epistaxis + facial trauma 02/2009, prior R biceps + supraspinatus tears admitted overnight 10/24/52 with metabolic encephalopathy and accidental fall. Allegedly blood was found down her hallway furniture and pieces of pictures were knocked off the shelves-posterior scalp laceration was stapled in the emergency room. Found to have Enterococcal Pyelo Had some hallucinations during admission and then started to have  Diarrhea x2 and was kept in Hospital to r/o Cdiff and for capacity to make medical decisions  Past medical history-As per Problem list Chart reviewed as below- Reviewed  Consultants:  Reviewed  Procedures:  None  Antibiotics:  IV ceftriaxone 5/28 Urine culture enterococcus   Subjective   Called by Rn after i had seen patient Hypotensive to 80's-received metoprolol this am IV saline bolus 500 and "stroke like symptoms gone" Has perseverations She told me earlier today she she was having hallucinations     Objective    Interim History:   Telemetry: Sinus rhythm and PVCs    Objective: Filed Vitals:   02/20/15 0412 02/20/15 1000 02/20/15 1134 02/20/15 1234  BP: 132/64 108/70 87/58 132/57  Pulse: 72 70 75   Temp: 97.6 F (36.4 C) 98 F (36.7 C)    TempSrc:  Oral    Resp: 17 18    Height:      Weight:      SpO2: 95% 96% 100%     Intake/Output Summary (Last 24 hours) at 02/20/15 1245 Last data filed at 02/20/15 1144  Gross per 24 hour  Intake 2571.25 ml  Output      0 ml  Net 2571.25 ml    Exam:   external ocular movements intact, Range of motion of neck intact with no JVD Stitches on the back of the head/sutures appear intact and clean with mild oozing Moderate  dentition S1-S2 grade 4/6 murmur best heard right upper sternal edge Chest clear Abdomen soft nontender No excoriations or lower extremity swelling Neurologically is intact without focal deficit finger-nose -finer intact Power 5/5-some restriciton mobility RUE  Data Reviewed: Basic Metabolic Panel:  Recent Labs Lab 02/17/15 1441 02/19/15 0600  NA 132* 132*  K 3.6 3.2*  CL 90* 96*  CO2 28 27  GLUCOSE 92 84  BUN 11 14  CREATININE 0.56 0.53  CALCIUM 8.5* 8.1*   Liver Function Tests:  Recent Labs Lab 02/17/15 1441  AST 44*  ALT 38  ALKPHOS 129*  BILITOT 1.5*  PROT 6.7  ALBUMIN 3.3*   No results for input(s): LIPASE, AMYLASE in the last 168 hours. No results for input(s): AMMONIA in the last 168 hours. CBC:  Recent Labs Lab 02/17/15 1441  WBC 9.4  NEUTROABS 8.5*  HGB 14.2  HCT 40.1  MCV 91.1  PLT 170   Cardiac Enzymes: No results for input(s): CKTOTAL, CKMB, CKMBINDEX, TROPONINI in the last 168 hours. BNP: Invalid input(s): POCBNP CBG:  Recent Labs Lab 02/20/15 1132  GLUCAP 100*    Recent Results (from the past 240 hour(s))  Urine culture     Status: None   Collection Time: 02/17/15  3:08 PM  Result Value Ref Range Status   Specimen Description URINE, RANDOM  Final   Special Requests NONE  Final   Colony Count   Final    >=  100,000 COLONIES/ML Performed at [facility]    Culture   Final    ENTEROCOCCUS SPECIES Performed at [facility]    Report Status 02/19/2015 FINAL  Final   Organism ID, Bacteria ENTEROCOCCUS SPECIES  Final      Susceptibility   Enterococcus species - MIC*    AMPICILLIN <=2 SENSITIVE Sensitive     LEVOFLOXACIN 1 SENSITIVE Sensitive     NITROFURANTOIN <=16 SENSITIVE Sensitive     VANCOMYCIN 1 SENSITIVE Sensitive     TETRACYCLINE >=16 RESISTANT Resistant     * ENTEROCOCCUS SPECIES  Culture, blood (routine x 2)     Status: None   Collection Time: 02/17/15  6:21 PM  Result Value Ref Range Status    Specimen Description BLOOD BLOOD RIGHT FOREARM  Final   Special Requests BOTTLES DRAWN AEROBIC AND ANAEROBIC 4 CC  Final   Culture   Final    STAPHYLOCOCCUS SPECIES (COAGULASE NEGATIVE) Note: THE SIGNIFICANCE OF ISOLATING THIS ORGANISM FROM A SINGLE SET OF BLOOD CULTURES WHEN MULTIPLE SETS ARE DRAWN IS UNCERTAIN. PLEASE NOTIFY THE MICROBIOLOGY DEPARTMENT WITHIN ONE WEEK IF SPECIATION AND SENSITIVITIES ARE REQUIRED. Note: Gram Stain Report Called to,Read Back By and Verified With: REBECCA SPARKS 5.29.16  52PM  [place] Performed at [facility]    Report Status 02/19/2015 FINAL  Final  Culture, blood (routine x 2)     Status: None (Preliminary result)   Collection Time: 02/17/15  6:28 PM  Result Value Ref Range Status   Specimen Description BLOOD RIGHT WRIST  Final   Special Requests BOTTLES DRAWN AEROBIC AND ANAEROBIC 3 CC  Final   Culture   Final           BLOOD CULTURE RECEIVED NO GROWTH TO DATE CULTURE WILL BE HELD FOR 5 DAYS BEFORE ISSUING A FINAL NEGATIVE REPORT Performed at [facility]    Report Status PENDING  Incomplete     Studies:              All Imaging reviewed and is as per above notation   Scheduled Meds: . amoxicillin  500 mg Oral Q12H  . cholecalciferol  5,000 Units Oral Once per day on Mon Wed Fri  . cycloSPORINE  1 drop Both Eyes BID  . enoxaparin (LOVENOX) injection  30 mg Subcutaneous Q24H  . fentaNYL (SUBLIMAZE) injection  25 mcg Intravenous Once  . lubiprostone  24 mcg Oral BID WC  . magnesium oxide  800 mg Oral Daily  . metoprolol tartrate  25 mg Oral BID  . pantoprazole  40 mg Oral Daily  . sodium chloride  3 mL Intravenous Q12H  . thyroid  60 mg Oral QAC breakfast   Continuous Infusions: . sodium chloride 75 mL/hr at 02/20/15 0657     Assessment/Plan:  1. Fall? Cause-keep on telemetry, get orthostatics-patient is on a beta blocker which I have d/c 5/31 given hypotensionbut refuses SNF placement. 2. Pyelonephritis 2/2 to  Enterococcus-C/S pending- Rocephin-->zosyn,-->amoxicillin 3. Diarrhea-r/o Cdiff-Diarrhea also can be a class effect of Penicillin-monitor 4. Toxic metabolic encephalopathy? Secondary to pyelonephritis versus mild to moderate dementia-I have asked psychiatry to see her tomorrow 56/1/16 Mildly ? LFT's-rpt CMET now as encephalopathy could be 2/2 to liver issues-get ammonia as well 5. Aortic stenosis-her valve area is 1.08. Her peak gradient is about 40.  She is preload dependent and is on a beta blocker--these 2 issues together might have precipitated a fall as well.   Needs cardiologyconsult as OP- requires cardiology  input  to determine if she is a candidate for TAVR.I 6. Paroxysmal atrial fibrillation, [place] score 4-not on anticoagulation because of GI bleed, falls. Continue metoprolol 25 twice a day-currently rate controlled. 7. Remote history of breast cancer-outpatient surveillance 8. Prior gastrointestinal bleed 12/2014-hemoglobin stable  Code Status: Full Family Communication: no famiyl +-have called her sister x2 so far Disposition Plan: inpatient   Verneita Griffes, MD  [facility] Pager [phone] 02/20/2015, 12:45 PM    LOS: 3 days

## 2015-02-20 NOTE — Progress Notes (Signed)
02/20/2015 12:27 PM  Called to room to assess patient per primary RN per patient's reports of "feeling like she's having a stroke."  Upon assessment, patient CBG and vitals are WNL, except for a soft BP.  Pt did just receive scheduled dose of metoprolol.  For the stroke symptoms, patient states that she is experiencing some facial tingling and numbness on the left side and some left side weakness.  Neuro and musculoskeletal assessment are WNL bilaterally, however.  No facial droop, slurring, or obvious weakness to either side noted.  Pt is c/o a dry mouth, given water.  Pt also wants to know why she doesn't have a sitter with her 24/7 because that's what she is supposed to have.  I clarified with the patient that PT recommended 24 hour supervision or SNF upon discharge, but she had declined.  I also reiterated that this does not mean we will provide a sitter with her 24/7 at the hospital.  She somewhat verbalized understanding but did not seem thrilled about this.  Dr. [name] was notified regarding BP as well as patient reports of "stroke-like symptoms" and orders for a bolus were received.  Primary nurse to continue to monitor patient.  [name]

## 2015-02-20 NOTE — Progress Notes (Signed)
Pt complains that she feels like she is having a stroke, her symptoms include numbness and left-sided weakness. Charge nurse assess with primary nurse, CBG 100 blood pressure 87/58, pulse 75,SpO2 100 %. MD notified, ordered 500 cc bolus. Feet elevated and head down. Will continue to monitor.   Penni Bombard, RN 02/20/2015 11:47 AM

## 2015-02-20 NOTE — Progress Notes (Signed)
Physical Therapy Treatment Patient Details Name: [name] MRN: [id number] DOB: 1927/02/09 Today's Date: 02/20/2015    History of Present Illness [name] is a 81 y.o. female with PMH of HTN, Hypothyroidism, Aortic stenosis, PAF (not on anticoagulation due to GIB) presented with unwitnessed fall, laceration to back of head. Patient lives alone, could not remember the fall. Per EMS: patient had blood down the hallway and furniture/pictures knocked off their shelves.    PT Comments    Did not report the symptoms of feeling "like a stroke" that she was feeling earlier today; Assisted pt back to bed in prep for rolling to radiology; noted improved balance in standing today  Follow Up Recommendations  SNF;Supervision/Assistance - 24 hour     Equipment Recommendations  Rolling walker with 5" wheels;3in1 (PT)    Recommendations for Other Services       Precautions / Restrictions Precautions Precautions: Fall    Mobility  Bed Mobility Overal bed mobility: Needs Assistance Bed Mobility: Sit to Supine       Sit to supine: Mod assist   General bed mobility comments: mod assist to help LEs into bed  Transfers Overall transfer level: Needs assistance Equipment used: 2 person hand held assist Transfers: Sit to/from Stand Sit to Stand: Mod assist Stand pivot transfers: Mod assist       General transfer comment: Continued mod assis to power up; noted much improved symmetry of weight bearing with significantly less L sided lean than on eval  Ambulation/Gait Ambulation/Gait assistance: +2 physical assistance;Mod assist Ambulation Distance (Feet):  (pivotal steps chair to bed) Assistive device: 2 person hand held assist Gait Pattern/deviations: Shuffle     General Gait Details: benefits from bil UE support for balance; did not push distance today as she was about to transport to Radiology   Stairs            Wheelchair Mobility    Modified Rankin (Stroke  Patients Only)       Balance             Standing balance-Leahy Scale: Poor                      Cognition Arousal/Alertness: Awake/alert Behavior During Therapy: WFL for tasks assessed/performed Overall Cognitive Status: Impaired/Different from baseline Area of Impairment: Orientation Orientation Level: Disoriented to;Situation   Memory: Decreased short-term memory         General Comments: More oriented today; Pt's safety judgement comes into question as she continues to assert that she wants to dc home; and has been having extreme difficulty managing as evidenced by readmissions    Exercises      General Comments        Pertinent Vitals/Pain Pain Assessment: Faces Faces Pain Scale: Hurts even more Pain Location: Headache; Noted pt grimace at the anticipation of movement Pain Descriptors / Indicators: Grimacing Pain Intervention(s): Limited activity within patient's tolerance    Home Living                      Prior Function            PT Goals (current goals can now be found in the care plan section) Acute Rehab PT Goals Patient Stated Goal: wants to be independent PT Goal Formulation: With patient Time For Goal Achievement: 03/04/15 Potential to Achieve Goals: Good Progress towards PT goals: Progressing toward goals    Frequency  Min 3X/week    PT  Plan Current plan remains appropriate    Co-evaluation             End of Session Equipment Utilized During Treatment: Gait belt Activity Tolerance: Patient tolerated treatment well Patient left: in bed;Other (comment) (travelling to Radiology)     Time: 3818-2993 PT Time Calculation (min) (ACUTE ONLY): 9 min  Charges:  $Therapeutic Activity: 8-22 mins                    G Codes:      Quin Hoop 02/20/2015, 2:52 PM  Roney Marion, [place] Pager [phone] Office [phone]

## 2015-02-20 NOTE — Progress Notes (Signed)
CRITICAL VALUE ALERT  Critical value received:  Potassium 2.5  Date of notification:  02/20/2015   Time of notification:  1400  Critical value read back:Yes.    Nurse who received alert:  Jodee Wagenaar A[facility]   MD notified (1st page):  Dr. [name]   Time of first page:  1405   MD notified (2nd page):  Time of second page:  Responding MD: Dr. [name]  Time MD responded:  [phone]

## 2015-02-20 NOTE — Clinical Social Work Note (Signed)
CSW received consult for SNF placement. Patient's sister Charlen Bakula contacted [phone]) and assessment completed - full assessment to follow. She is agreeable to SNF placement for patient.  Quintasia Theroux Givens, MSW, LCSW Licensed Clinical Social Worker [place] [phone]

## 2015-02-21 ENCOUNTER — Inpatient Hospital Stay (HOSPITAL_COMMUNITY): Payer: Medicare Other

## 2015-02-21 ENCOUNTER — Encounter (HOSPITAL_COMMUNITY): Payer: Self-pay

## 2015-02-21 DIAGNOSIS — F05 Delirium due to known physiological condition: Secondary | ICD-10-CM

## 2015-02-21 DIAGNOSIS — R410 Disorientation, unspecified: Secondary | ICD-10-CM | POA: Diagnosis present

## 2015-02-21 DIAGNOSIS — E43 Unspecified severe protein-calorie malnutrition: Secondary | ICD-10-CM | POA: Insufficient documentation

## 2015-02-21 LAB — BASIC METABOLIC PANEL
Anion gap: 10 (ref 5–15)
BUN: 11 mg/dL (ref 6–20)
CHLORIDE: 99 mmol/L — AB (ref 101–111)
CO2: 23 mmol/L (ref 22–32)
Calcium: 8.1 mg/dL — ABNORMAL LOW (ref 8.9–10.3)
Creatinine, Ser: 0.53 mg/dL (ref 0.44–1.00)
GFR calc Af Amer: >60 mL/min (ref >60)
Glucose, Bld: 180 mg/dL — ABNORMAL HIGH (ref 65–99)
POTASSIUM: 4.4 mmol/L (ref 3.5–5.1)
Sodium: 132 mmol/L — ABNORMAL LOW (ref 135–145)

## 2015-02-21 LAB — CBC
HEMATOCRIT: 38.5 % (ref 36.0–46.0)
HEMOGLOBIN: 13.3 g/dL (ref 12.0–15.0)
MCH: 31.5 pg (ref 26.0–34.0)
MCHC: 34.5 g/dL (ref 30.0–36.0)
MCV: 91.2 fL (ref 78.0–100.0)
PLATELETS: 238 10*3/uL (ref 150–400)
RBC: 4.22 MIL/uL (ref 3.87–5.11)
RDW: 15.2 % (ref 11.5–15.5)
WBC: 8.8 10*3/uL (ref 4.0–10.5)

## 2015-02-21 MED ORDER — MILK AND MOLASSES ENEMA
1.0000 | Freq: Once | RECTAL | Status: DC
Start: 2015-02-21 — End: 2015-02-22
  Filled 2015-02-21: qty 250

## 2015-02-21 MED ORDER — IOHEXOL 300 MG/ML  SOLN
80.0000 mL | Freq: Once | INTRAMUSCULAR | Status: AC | PRN
  Administered 2015-02-21: 80 mL via INTRAVENOUS

## 2015-02-21 MED ORDER — IOHEXOL 300 MG/ML  SOLN
25.0000 mL | INTRAMUSCULAR | Status: AC
  Administered 2015-02-21 (×2): 25 mL via ORAL

## 2015-02-21 MED ORDER — BOOST / RESOURCE BREEZE PO LIQD
1.0000 | Freq: Three times a day (TID) | ORAL | Status: DC
  Administered 2015-02-21 – 2015-02-26 (×8): 1 via ORAL

## 2015-02-21 NOTE — Progress Notes (Signed)
Physical Therapy Treatment Patient Details Name: [name] MRN: [id number] DOB: 01-Feb-1927 Today's Date: 02/21/2015    History of Present Illness [name] is a 81 y.o. female with PMH of HTN, Hypothyroidism, Aortic stenosis, PAF (not on anticoagulation due to GIB) presented with unwitnessed fall, laceration to back of head. Patient lives alone, could not remember the fall. Per EMS: patient had blood down the hallway and furniture/pictures knocked off their shelves.    PT Comments    Slowly making progress with mobility; Able to take a few steps bed to [facility], and then to recliner with RW and moderate physical assist; Better standing symmetry; At her current level of dependence dc home is not a safe option   Follow Up Recommendations  SNF;Supervision/Assistance - 24 hour     Equipment Recommendations  Rolling walker with 5" wheels;3in1 (PT)    Recommendations for Other Services       Precautions / Restrictions Precautions Precautions: Fall    Mobility  Bed Mobility Overal bed mobility: Needs Assistance Bed Mobility: Supine to Sit     Supine to sit: Mod assist     General bed mobility comments: Mod assist today to elevate trunk from sidelying to sit due to abdominal discomfort; heavy dependence on UEs  Transfers Overall transfer level: Needs assistance Equipment used: Rolling walker (2 wheeled) Transfers: Sit to/from Stand Sit to Stand: Mod assist;+2 safety/equipment         General transfer comment: moderate assist to power-up; cues for hand placement and safety; support given at elbows/shoulder girdle bilaterally  Ambulation/Gait Ambulation/Gait assistance: +2 safety/equipment;Min assist Ambulation Distance (Feet): 5 Feet (46ft bed to BSC, 3 ft BSC to recliner) Assistive device: Rolling walker (2 wheeled) Gait Pattern/deviations: Shuffle;Trunk flexed   Gait velocity interpretation: Below normal speed for age/gender General Gait Details: Steadying assist  and cues to keep RW close; encouragement to participate and try to walk more   Stairs            Wheelchair Mobility    Modified Rankin (Stroke Patients Only)       Balance     Sitting balance-Leahy Scale: Fair       Standing balance-Leahy Scale: Poor                      Cognition Arousal/Alertness: Awake/alert Behavior During Therapy: WFL for tasks assessed/performed Overall Cognitive Status: Impaired/Different from baseline                 General Comments: Pt continues to assert that she will be fine at home; then when we talk about needing to push to walk further to be able to manage at home, she states she can't do any more -- she is not making the connection between her current dependent functional status and that it is unsafe to dc homed    Exercises      General Comments General comments (skin integrity, edema, etc.): incontinent of urine and stool (loose stool); noted this once pt was standing up      Pertinent Vitals/Pain Pain Assessment: 0-10 Pain Score: 8  Pain Location: abdominal pain Pain Descriptors / Indicators: Aching;Discomfort Pain Intervention(s): Limited activity within patient's tolerance;Monitored during session;Repositioned    Home Living                      Prior Function            PT Goals (current goals can now be found  in the care plan section) Acute Rehab PT Goals Patient Stated Goal: wants to be independent PT Goal Formulation: With patient Time For Goal Achievement: 03/04/15 Potential to Achieve Goals: Good Progress towards PT goals: Progressing toward goals    Frequency  Min 3X/week    PT Plan Current plan remains appropriate    Co-evaluation             End of Session Equipment Utilized During Treatment: Gait belt Activity Tolerance: Patient tolerated treatment well Patient left: in chair;with call bell/phone within reach;with chair alarm set     Time: 1000-1025 PT Time  Calculation (min) (ACUTE ONLY): 25 min  Charges:  $Gait Training: 8-22 mins $Therapeutic Activity: 8-22 mins                    G Codes:      [name] 02/21/2015, 11:33 AM  [name], [name] Pager [phone] Office [phone]

## 2015-02-21 NOTE — Progress Notes (Signed)
Initial Nutrition Assessment  DOCUMENTATION CODES:  Severe malnutrition in context of chronic illness, Underweight  INTERVENTION:  Boost Breeze (TID), each supplement provides 250 kcal and 9 grams of protein.  Encourage adequate PO intake.   NUTRITION DIAGNOSIS:  Malnutrition related to chronic illness as evidenced by percent weight loss, severe depletion of body fat, severe depletion of muscle mass.  GOAL:  Patient will meet greater than or equal to 90% of their needs  MONITOR:  PO intake, Supplement acceptance, Weight trends, Labs, I & O's  REASON FOR ASSESSMENT:   (Low BMI)    ASSESSMENT: Pt with PMH of HTN, Hypothyroidism, Aortic stenosis, PAF (not on anticoagulation due to GIB) presented with unwitnessed fall, laceration to back of head. AXR shows sigmoid ileus.  Meal completion has been 25%. Pt reports her appetite is fine. PTA she reports eating well with at least 2 meals a day. Per Epic weight records, pt with a 10.4% weight loss in 5 months. RD to order supplements to aid in caloric and protein needs. Pt does report she has tried Ensure at home, however did not like it. RD will order [employer].   Nutrition-Focused physical exam completed. Findings are severe fat depletion, severe muscle depletion, and no edema.   Labs: Low sodium, chloride, and calcium.  Height:  Ht Readings from Last 1 Encounters:  02/17/15 5\' 7"  (1.702 m)    Weight:  Wt Readings from Last 1 Encounters:  02/19/15 112 lb 7 oz (51 kg)    Ideal Body Weight:  61 kg  Wt Readings from Last 10 Encounters:  02/19/15 112 lb 7 oz (51 kg)  12/22/14 116 lb 6.5 oz (52.8 kg)  08/31/14 125 lb (56.7 kg)  08/02/14 125 lb (56.7 kg)  06/01/14 125 lb (56.7 kg)  05/15/14 125 lb (56.7 kg)  03/02/14 125 lb (56.7 kg)  11/23/13 125 lb (56.7 kg)  03/09/13 128 lb (58.06 kg)  11/02/12 128 lb (58.06 kg)    BMI:  Body mass index is 17.61 kg/(m^2). Underweight  Estimated Nutritional Needs:  Kcal:   1600-1800  Protein:  70-85 grams   Fluid:  1.6 - 1.8 L/day  Skin:  Wound (see comment) (laceration on head)  Diet Order:  Diet regular Room service appropriate?: Yes; Fluid consistency:: Thin Diet - low sodium heart healthy  EDUCATION NEEDS:  No education needs identified at this time   Intake/Output Summary (Last 24 hours) at 02/21/15 1527 Last data filed at 02/21/15 0900  Gross per 24 hour  Intake 2028.75 ml  Output    300 ml  Net 1728.75 ml    Last BM:  6/1  Kallie Locks, MS, RD, LDN Pager # [phone] After hours/ weekend pager # [phone]

## 2015-02-21 NOTE — Progress Notes (Addendum)
TANGA GLOOR BWI:[id number] DOB: 10/10/1926 DOA: 02/17/2015 PCP: Cari Caraway, MD  Brief narrative: 81 y/o ? recent admission 3.28-->12/22/14 Antral gastritis 2/2 Aleve, Chr Afib [place] score 4 not on AC 2/2 GIB, Hyponatremia, remote h/o breast cancer, prior severe fall resulting epistaxis + facial trauma 02/2009, prior R biceps + supraspinatus tears admitted overnight 6/38/45 with metabolic encephalopathy and accidental fall. Allegedly blood was found down her hallway furniture and pieces of pictures were knocked off the shelves-posterior scalp laceration was stapled in the emergency room. Found to have Enterococcal Pyelo and colonic ileus  Past medical history-As per Problem list Chart reviewed as below- Reviewed  Consultants:  Reviewed  Procedures:  None  Antibiotics:  IV ceftriaxone 5/28 Urine culture enterococcus   Subjective  Small Bm after enema yesterday still with signif abd distension     Objective    Objective: Filed Vitals:   02/20/15 1737 02/20/15 2137 02/21/15 0519 02/21/15 1000  BP: 112/64 121/73 131/73 142/73  Pulse: 76 76 68 70  Temp: 98.4 F (36.9 C) 97.7 F (36.5 C) 97.5 F (36.4 C) 97.8 F (36.6 C)  TempSrc: Oral Oral Oral Oral  Resp: 18 18 18 18   Height:      Weight:      SpO2: 100% 98% 100% 100%    Intake/Output Summary (Last 24 hours) at 02/21/15 1605 Last data filed at 02/21/15 0900  Gross per 24 hour  Intake 2028.75 ml  Output    300 ml  Net 1728.75 ml    Exam:  Gen: AAOx3, no distress HEENT: PERRLA Stitches on the back of the head/sutures appear intact and clean with mild oozing CVS: X6I6/OEH, systolic murmur Chest CTAB Abdomen soft nontender, very distended, BS decreased but present No edema    Data Reviewed: Basic Metabolic Panel:  Recent Labs Lab 02/17/15 1441 02/19/15 0600 02/20/15 1305 02/21/15 1230  NA 132* 132* 134* 132*  K 3.6 3.2* 2.5* 4.4  CL 90* 96* 97* 99*  CO2 28 27 26 23   GLUCOSE 92 84 97 180*  BUN  11 14 11 11   CREATININE 0.56 0.53 0.35* 0.53  CALCIUM 8.5* 8.1* 7.9* 8.1*   Liver Function Tests:  Recent Labs Lab 02/17/15 1441 02/20/15 1305  AST 44* 23  ALT 38 27  ALKPHOS 129* 99  BILITOT 1.5* 1.3*  PROT 6.7 5.1*  ALBUMIN 3.3* 2.8*   No results for input(s): LIPASE, AMYLASE in the last 168 hours.  Recent Labs Lab 02/20/15 1305  AMMONIA 13   CBC:  Recent Labs Lab 02/17/15 1441 02/21/15 1230  WBC 9.4 8.8  NEUTROABS 8.5*  --   HGB 14.2 13.3  HCT 40.1 38.5  MCV 91.1 91.2  PLT 170 238   Cardiac Enzymes: No results for input(s): CKTOTAL, CKMB, CKMBINDEX, TROPONINI in the last 168 hours. BNP: Invalid input(s): POCBNP CBG:  Recent Labs Lab 02/20/15 1132  GLUCAP 100*    Recent Results (from the past 240 hour(s))  Urine culture     Status: None   Collection Time: 02/17/15  3:08 PM  Result Value Ref Range Status   Specimen Description URINE, RANDOM  Final   Special Requests NONE  Final   Colony Count   Final    >=100,000 COLONIES/ML Performed at [facility]    Culture   Final    ENTEROCOCCUS SPECIES Performed at [facility]    Report Status 02/19/2015 FINAL  Final   Organism ID, Bacteria ENTEROCOCCUS SPECIES  Final  Susceptibility   Enterococcus species - MIC*    AMPICILLIN <=2 SENSITIVE Sensitive     LEVOFLOXACIN 1 SENSITIVE Sensitive     NITROFURANTOIN <=16 SENSITIVE Sensitive     VANCOMYCIN 1 SENSITIVE Sensitive     TETRACYCLINE >=16 RESISTANT Resistant     * ENTEROCOCCUS SPECIES  Culture, blood (routine x 2)     Status: None   Collection Time: 02/17/15  6:21 PM  Result Value Ref Range Status   Specimen Description BLOOD BLOOD RIGHT FOREARM  Final   Special Requests BOTTLES DRAWN AEROBIC AND ANAEROBIC 4 CC  Final   Culture   Final    STAPHYLOCOCCUS SPECIES (COAGULASE NEGATIVE) Note: THE SIGNIFICANCE OF ISOLATING THIS ORGANISM FROM A SINGLE SET OF BLOOD CULTURES WHEN MULTIPLE SETS ARE DRAWN IS UNCERTAIN. PLEASE NOTIFY  THE MICROBIOLOGY DEPARTMENT WITHIN ONE WEEK IF SPECIATION AND SENSITIVITIES ARE REQUIRED. Note: Gram Stain Report Called to,Read Back By and Verified With: REBECCA SPARKS 5.29.16  26PM  [place] Performed at [facility]    Report Status 02/19/2015 FINAL  Final  Culture, blood (routine x 2)     Status: None (Preliminary result)   Collection Time: 02/17/15  6:28 PM  Result Value Ref Range Status   Specimen Description BLOOD RIGHT WRIST  Final   Special Requests BOTTLES DRAWN AEROBIC AND ANAEROBIC 3 CC  Final   Culture   Final           BLOOD CULTURE RECEIVED NO GROWTH TO DATE CULTURE WILL BE HELD FOR 5 DAYS BEFORE ISSUING A FINAL NEGATIVE REPORT Performed at [facility]    Report Status PENDING  Incomplete     Studies:              All Imaging reviewed and is as per above notation   Scheduled Meds: . amoxicillin-clavulanate  1 tablet Oral Q12H  . cholecalciferol  5,000 Units Oral Once per day on Mon Wed Fri  . cycloSPORINE  1 drop Both Eyes BID  . enoxaparin (LOVENOX) injection  30 mg Subcutaneous Q24H  . feeding supplement (RESOURCE BREEZE)  1 Container Oral TID BM  . fentaNYL (SUBLIMAZE) injection  25 mcg Intravenous Once  . lubiprostone  24 mcg Oral BID WC  . magnesium oxide  800 mg Oral Daily  . metoprolol tartrate  25 mg Oral BID  . milk and molasses  1 enema Rectal Once  . pantoprazole  40 mg Oral Daily  . potassium chloride  40 mEq Oral BID  . sodium chloride  3 mL Intravenous Q12H  . thyroid  60 mg Oral QAC breakfast   Continuous Infusions: . sodium chloride 75 mL/hr at 02/20/15 2230     Assessment/Plan:    1. Fall suspected due to UTI. Neuro exam is non focal. CT head: no acute findings. Scalp laceration stapled in ED -treat UTI, PT/OT eval, completed, no events on tele thus far  2. Enterococcus UTI: was treated with Rocephin, thenzosyn,now augmentin to complete course  3. Colonic Ileus: With Significant Abd distention, noted on KUB yesterday,  with large colonic stool burden -Repeat enema again today, check CT abdomen pelvis with contrast She has chronic constipation, followed by Dr. Paulita Fujita and has been on Amitiza for this in the past. -Continue diet for now given overall lack of symptoms, await CT -h/o colonoscopy 30years ago and refuses ever having another one in the furture  4. Toxic metabolic encephalopathy: -due to UTI improving    5. 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  Moderate AS -outpatient Cards eval -resume low dose BB  7. Paroxysmal atrial fibrillation, [place] score 4-not on anticoagulation because of GI bleed, falls. Continue metoprolol 25 twice a day-currently rate controlled.  8.  Remote history of breast cancer-outpatient surveillance  DVT proph: lovenox  Code Status: Full Family Communication: no family at bedside, Dr.Samtani d/w sister x2 so far Disposition Plan: inpatient   Domenic Polite, MD  [facility] Pager [phone] 02/21/2015, 4:05 PM    LOS: 4 days

## 2015-02-21 NOTE — Consult Note (Signed)
[facility] Consult   Reason for Consult:  Hallucinations and capacity evaluation Referring Physician:  Dr. [name] Patient Identification: [name] MRN:  [id number] Principal Diagnosis: Subacute delirium Diagnosis:   Patient Active Problem List   Diagnosis Date Noted  . Altered mental state [R41.82] 02/17/2015  . UTI (lower urinary tract infection) [N39.0] 02/17/2015  . GI bleed [K92.2] 12/19/2014  . Hypothyroid [E03.9] 12/19/2014  . Atrial fibrillation with rapid ventricular response, paroxysmal [I48.91] 12/19/2014  . SVT (supraventricular tachycardia) [I47.1] 12/19/2014  . Hypokalemia [E87.6] 12/19/2014  . Altered mental status [R41.82]   . Bleeding gastrointestinal [K92.2]   . GI bleeding [K92.2] 12/18/2014  . Varicose veins of lower extremities with ulcer [I83.009, L97.909] 08/02/2014  . Varicose veins of lower extremities with other complications [X21.194] 17/40/8144  . Leg swelling [M79.89] 03/03/2012  . Weakness generalized [R53.1] 04/15/2011  . OSTEOARTHRITIS, SPINE, NOS [M47.9] 12/20/2009  . SACROILIITIS [M46.1] 06/05/2009  . CONTUSION OF MULTIPLE SITES NEC [IMO0002] 06/05/2009  . CONSTIPATION [K59.00] 05/29/2009  . VERTEBROBASILAR ARTERY SYNDROME [G45.0] 03/30/2009  . NOSE FRACTURE [S02.2XXA] 03/30/2009  . TARSAL TUNNEL SYNDROME, LEFT [G57.50] 02/12/2009  . OSTEOARTHRITIS, KNEES, BILATERAL [M17.9] 01/04/2009  . OSTEOARTHRITIS, HANDS, BILATERAL [M19.049] 12/21/2008  . ROTATOR CUFF SYNDROME, RIGHT [M75.50] 07/07/2008  . Vitamin D deficiency [E55.9] 03/09/2008  . CONTACT DERMATITIS&OTHER ECZEMA DUE TO PLANTS [L25.5] 03/09/2008  . Osteoporosis, unspecified [M81.0] 03/09/2008    Total Time spent with patient: 45 minutes  Subjective:   [name] is a 81 y.o. female patient admitted with AMS.  HPI:  [name] is a 81 years old female seen face-to-face for psychiatric consultation evaluation of auditory hallucinations, confusion and required  capacity evaluation for placement out of the home. Patient reported [name] has been living by herself has a limited help from outside and reportedly admitted to hospital secondary to altered mental status and possible urinary tract infection. Patient was received appropriate medications and supportive care which helped her to be improved her confusion and delirium. Patient reported one episode of seeing a group of Dancers in her room and walking around at nighttime. Patient denies current symptoms of hallucinations, delusions, paranoia and has interested in placing out of the home. Patient has no family history of mental illness or personal history of mental health treatment. Patient reportedly has constipation, stomach bloating, multiple body pains and generalized weakness. Patient denied history of drug abuse and alcoholism. Patient has intact cognitions including orientation, concentration, memory and language functions. Patient understand her current medical problems and needed treatment. Patient does not have any acute psychosis or confusion at this time.    Past Medical History:  Past Medical History  Diagnosis Date  . Cancer   . Breast cancer, left breast   . Osteoporosis   . Thyroid disease   . Hypertension   . MVP (mitral valve prolapse)     Past Surgical History  Procedure Laterality Date  . Breast lumpectomy    . Hemrrhoidectomy    . Saline implant after left mastectomy    . Esophagogastroduodenoscopy [name] 12/19/2014    Procedure: ESOPHAGOGASTRODUODENOSCOPY (EGD);  Surgeon: Teena Irani, MD;  Location: [facility];  Service: Endoscopy;  Laterality: [name];   Family History:  Family History  Problem Relation Age of Onset  . Cancer Sister     breast    Social History:  History  Alcohol Use No     History  Drug Use No    History   Social History  . Marital  Status: Single    Spouse Name: [name]  . Number of Children: [name]  . Years of Education: [name]   Occupational History  .  Retired    Social History Main Topics  . Smoking status: Never Smoker   . Smokeless tobacco: Not on file  . Alcohol Use: No  . Drug Use: No  . Sexual Activity: No   Other Topics Concern  . None   Social History Narrative   [name] lives alone.   Additional Social History:                          Allergies:   Allergies  Allergen Reactions  . Cephalexin Nausea And Vomiting  . Linzess [Linaclotide] Other (See Comments)    Fatigue and muscle weakness  . Ultram [Tramadol Hcl] Nausea Only  . Latex Rash    Skin rash with bandaids and tapes  . Tape Rash    Reaction to adhesive tape    Labs:  Results for orders placed or performed during the hospital encounter of 02/17/15 (from the past 48 hour(s))  Glucose, capillary     Status: Abnormal   Collection Time: 02/20/15 11:32 AM  Result Value Ref Range   Glucose-Capillary 100 (H) 65 - 99 mg/dL  Comprehensive metabolic panel     Status: Abnormal   Collection Time: 02/20/15  1:05 PM  Result Value Ref Range   Sodium 134 (L) 135 - 145 mmol/L   Potassium 2.5 (LL) 3.5 - 5.1 mmol/L    Comment: REPEATED TO VERIFY CRITICAL RESULT CALLED TO, READ BACK BY AND VERIFIED WITH: JASMINE SANDERS,RN AT 1351 02/20/15 BY ZBEECH.    Chloride 97 (L) 101 - 111 mmol/L   CO2 26 22 - 32 mmol/L   Glucose, Bld 97 65 - 99 mg/dL   BUN 11 6 - 20 mg/dL   Creatinine, Ser 0.35 (L) 0.44 - 1.00 mg/dL   Calcium 7.9 (L) 8.9 - 10.3 mg/dL   Total Protein 5.1 (L) 6.5 - 8.1 g/dL   Albumin 2.8 (L) 3.5 - 5.0 g/dL   AST 23 15 - 41 U/L   ALT 27 14 - 54 U/L   Alkaline Phosphatase 99 38 - 126 U/L   Total Bilirubin 1.3 (H) 0.3 - 1.2 mg/dL   GFR calc non Af Amer >60 >60 mL/min   GFR calc Af Amer >60 >60 mL/min    Comment: (NOTE) The eGFR has been calculated using the CKD EPI equation. This calculation has not been validated in all clinical situations. eGFR's persistently <60 mL/min signify possible Chronic Kidney Disease.    Anion gap 11 5 - 15  Ammonia      Status: None   Collection Time: 02/20/15  1:05 PM  Result Value Ref Range   Ammonia 13 9 - 35 umol/L    Vitals: Blood pressure 131/73, pulse 68, temperature 97.5 F (36.4 C), temperature source Oral, resp. rate 18, height $RemoveBe'5\' 7"'IimMgGTIC$  (1.702 m), weight 51 kg (112 lb 7 oz), SpO2 100 %.  Risk to Self: Is patient at risk for suicide?: No Risk to Others:   Prior Inpatient Therapy:   Prior Outpatient Therapy:    Current Facility-Administered Medications  Medication Dose Route Frequency Provider Last Rate Last Dose  . 0.9 %  sodium chloride infusion   Intravenous Continuous Kinnie Feil, MD 75 mL/hr at 02/20/15 2230    . acetaminophen (TYLENOL) tablet 650 mg  650 mg Oral Q6H PRN Arie Sabina  Buriev, MD   650 mg at 02/21/15 6256   Or  . acetaminophen (TYLENOL) suppository 650 mg  650 mg Rectal Q6H PRN Kinnie Feil, MD      . amoxicillin-clavulanate (AUGMENTIN) 875-125 MG per tablet 1 tablet  1 tablet Oral Q12H Domenic Polite, MD   1 tablet at 02/20/15 2212  . cholecalciferol (VITAMIN D) tablet 5,000 Units  5,000 Units Oral Once per day on Mon Wed Fri Kinnie Feil, MD   5,000 Units at 02/19/15 1153  . cycloSPORINE (RESTASIS) 0.05 % ophthalmic emulsion 1 drop  1 drop Both Eyes BID Kinnie Feil, MD   1 drop at 02/20/15 2214  . enoxaparin (LOVENOX) injection 30 mg  30 mg Subcutaneous Q24H Eudelia Bunch, RPH   30 mg at 02/20/15 2213  . fentaNYL (SUBLIMAZE) injection 25 mcg  25 mcg Intravenous Once Delos Haring, PA-C   25 mcg at 02/17/15 1833  . lubiprostone (AMITIZA) capsule 24 mcg  24 mcg Oral BID WC Kinnie Feil, MD   24 mcg at 02/20/15 1614  . magnesium oxide (MAG-OX) tablet 800 mg  800 mg Oral Daily Kinnie Feil, MD   800 mg at 02/20/15 1019  . metoprolol tartrate (LOPRESSOR) tablet 25 mg  25 mg Oral BID Kinnie Feil, MD   25 mg at 02/20/15 2213  . pantoprazole (PROTONIX) EC tablet 40 mg  40 mg Oral Daily Kinnie Feil, MD   40 mg at 02/17/15 2241  . potassium chloride  SA (K-DUR,KLOR-CON) CR tablet 40 mEq  40 mEq Oral BID Nita Sells, MD   40 mEq at 02/20/15 2212  . sodium chloride 0.9 % injection 3 mL  3 mL Intravenous Q12H Kinnie Feil, MD   3 mL at 02/17/15 2238  . thyroid (ARMOUR) tablet 60 mg  60 mg Oral QAC breakfast Kinnie Feil, MD   60 mg at 02/20/15 0810    Musculoskeletal: Strength & Muscle Tone: decreased Gait & Station: unable to stand Patient leans: [name]  Psychiatric Specialty Exam: Physical Exam as per history and physical   ROS generalized body pains, stomach pain, stomach bloating and constipation. Patient denies chest pain, shortness of breath, dizziness, fever, chills. No Fever-chills, No Headache, No changes with Vision or hearing, reports vertigo No problems swallowing food or Liquids, No Chest pain, Cough or Shortness of Breath, No Abdominal pain, No Nausea or Vommitting, Bowel movements are regular, No Blood in stool or Urine, No dysuria, No new skin rashes or bruises, No new joints pains-aches,  No new weakness, tingling, numbness in any extremity, No recent weight gain or loss, No polyuria, polydypsia or polyphagia,   A full 10 point Review of Systems was done, except as stated above, all other Review of Systems were negative.  Blood pressure 131/73, pulse 68, temperature 97.5 F (36.4 C), temperature source Oral, resp. rate 18, height $RemoveBe'5\' 7"'JQjdColay$  (1.702 m), weight 51 kg (112 lb 7 oz), SpO2 100 %.Body mass index is 17.61 kg/(m^2).  General Appearance: Casual, appeared sitting in a chair next to her bed   Eye Contact::  Good  Speech:  Clear and Coherent  Volume:  Decreased  Mood:  Anxious  Affect:  Appropriate and Congruent  Thought Process:  Coherent and Goal Directed  Orientation:  Full (Time, Place, and Person)  Thought Content:  WDL  Suicidal Thoughts:  No  Homicidal Thoughts:  No  Memory:  Immediate;   Fair Recent;   Fair  Judgement:  Fair  Insight:  Fair  Psychomotor Activity:  Decreased   Concentration:  Good  Recall:  Good  Fund of Knowledge:Good  Language: Good  Akathisia:  Negative  Handed:  Right  AIMS (if indicated):     Assets:  Communication Skills Desire for Improvement Financial Resources/Insurance Housing Intimacy Leisure Time Resilience Social Support Talents/Skills Transportation  ADL's:  Impaired  Cognition: WNL  Sleep:      Medical Decision Making: Review of Psycho-Social Stressors (1), Review or order clinical lab tests (1), New Problem, with no additional work-up planned (3), Review of Last Therapy Session (1), Review or order medicine tests (1), Review of Medication Regimen & Side Effects (2) and Review of New Medication or Change in Dosage (2)  Treatment Plan Summary: Patient appeared to be in delirium secondary to urinary tract infection which slowly subsidized with the treatment. Daily contact with patient to assess and evaluate symptoms and progress in treatment and Medication management  Plan:  Patient has capacity to make her own medical decisions and living arrangements. Patient is willing to participate in skilled nursing facility reportedly chosen [facility] skilled nursing home Patient does not meet criteria for psychiatric inpatient admission. Supportive therapy provided about ongoing stressors.   Disposition: Patient benefit from the skilled nursing home placement as [name] was not able to care for herself and limited psychosocial support at home.  Samy Ryner,JANARDHAHA R. 02/21/2015 10:04 AM

## 2015-02-21 NOTE — Clinical Social Work Note (Signed)
Contacted Deirdre Pippins, admissions director with [facility] to provide an update on patient. Janie requested that CSW update her on 6/2 regarding patient's readiness for discharge.   Aadhya Bustamante Givens, MSW, LCSW Licensed Clinical Social Worker [place] [phone]

## 2015-02-21 NOTE — Clinical Social Work Note (Signed)
Clinical Social Work Assessment  Patient Details  Name: [name] MRN: [id number] Date of Birth: 02/14/1927  Date of referral:  02/20/15               Reason for consult:  Facility Placement                Permission sought to share information with:    Permission granted to share information::  Yes, Verbal Permission Granted  Name::     [name], lives in [place], [place]::     Relationship::  Sister  Contact Information:  [phone]  Housing/Transportation Living arrangements for the past 2 months:  [place] of Information:  Patient, Other (Comment Required) (Patient's sister) Patient Interpreter Needed:  None Criminal Activity/Legal Involvement Pertinent to Current Situation/Hospitalization:  No - Comment as needed Significant Relationships:   (Patient's friend, [name] lives in [place]. Her sister lives in [place], [place]) Lives with:  Siblings, Friends Do you feel safe going back to the place where you live?    Need for family participation in patient care:  Yes (Comment)  Care giving concerns: Patient lives alone and has on one that can provide the level of assistance she needs post-discharge. CSW talked with patient's sister [name] by phone regarding ST rehab as sister very concerned about patient because she lives alone.    Social Worker assessment / plan:  CSW talked with patient regarding discharge plans. Patient is alert, oriented and able to converse appropriately with CSW. There was some repeating of information during the conversation, otherwise, patient was appropriate in her communication and responses. [name] was made aware of conversation with her sister [name] regarding ST rehab on 02/20/15 and she is in agreement and understands that she needs help before going home. Patient initially talked about getting some help at home from [facility] and this was discussed. CSW clarified that the nurse case manager would assist with  home health needs, and reiterated that she needs more help at this time and patient remains in agreement. CSW and patient also discussed her insurance in terms of patient for rehab. [name] is fine with going to [facility] or [facility], the two facilities that her sister felt patient would be agreeable to going to for rehab.  Patient talked about her friend [name], who lives in [place] and mentioned that Tye [place] had visited her Tuesday evening.   Employment status:  Retired Health visitor, Managed Care PT Recommendations:  [place] / Referral to community resources:  Other (Comment Required) (Options not appropriate for patient at this time)  Patient/Family's Response to care:  Patient not sure that all of her medical needs are being appropriately met during this hospital stay.  Patient/Family's Understanding of and Emotional Response to Diagnosis, Current Treatment, and Prognosis:  Patient mentioned that she is being told she has A-fib but she does not have any of the symptoms.  Emotional Assessment Appearance:  Appears stated age Attitude/Demeanor/Rapport:  Other (Attitude appropriate for interaction) Affect (typically observed):  Appropriate, Calm Orientation:  Oriented to Self, Oriented to Place, Oriented to Situation (Patient appears generally oriented to situation. She displayed some memory loss, i.e. repeating herself.) Alcohol / Substance use:  Tobacco Use, Alcohol Use (Patient reports that she does not drink or consume alcohol) Psych involvement (Current and /or in the community):  No (Comment)  Discharge Needs  Concerns to be addressed:  Discharge Planning Concerns (Patient lives  alone and does not have anyone that can provide 24 hour care/supervision) Readmission within the last 30 days:  No Current discharge risk:  Lives alone Barriers to Discharge:  No Barriers Identified - patient in agreement with ST  rehab.   Erika Feil, LCSW 02/21/2015, 1:38 PM

## 2015-02-21 NOTE — Evaluation (Signed)
Clinical/Bedside Swallow Evaluation Patient Details  Name: [name] MRN: [id number] Date of Birth: Feb 14, 1927  Today's Date: 02/21/2015 Time: SLP Start Time (ACUTE ONLY): 5465 SLP Stop Time (ACUTE ONLY): 1353 SLP Time Calculation (min) (ACUTE ONLY): 20 min  Past Medical History:  Past Medical History  Diagnosis Date  . Cancer   . Breast cancer, left breast   . Osteoporosis   . Thyroid disease   . Hypertension   . MVP (mitral valve prolapse)    Past Surgical History:  Past Surgical History  Procedure Laterality Date  . Breast lumpectomy    . Hemrrhoidectomy    . Saline implant after left mastectomy    . Esophagogastroduodenoscopy N/A 12/19/2014    Procedure: ESOPHAGOGASTRODUODENOSCOPY (EGD);  Surgeon: Teena Irani, MD;  Location: [facility];  Service: Endoscopy;  Laterality: N/A;   HPI:  81 y.o. female with PMH of HTN, hypothyroidism, aortic stenosis, PAF, thyroid disease presented with unwitnessed fall, laceration to back of head. Per MD documentation pt found to have UTI. CT no acute intracranial abnormality. EGD 12/19/14 small hiatal hernia with minimal lower esophageal ring. CXR 5/31 Interval development of patchy pneumonia involving the right lung base, dense left lower lobe atelectasis and/or pneumonia, and a small to moderate sized right pleural effusion since the chest x-ray 3 days ago.   Assessment / Plan / Recommendation Clinical Impression  Suspect pt may have esophageal related dysphagia indicated by belching following pills with RN, reported esophageal globus sensation and finding of hiatal hernia during EGD 11/2014. Pt is adament that she does "not have reflux" or problems with esophagus after SLP educated/explained silent reflux and dysmotility. No indications of aspiration with any consistency. If pna in po related, it may be from esophageal componenet. SLP educated re: reflux precautions. Pt reports only eating "soups and drinking liquids for awhile" prior to admission  due to abdominal distention/discomfort. Diet is regular, however she may prefer clear or full liquids. No ST follow up needed.     Aspiration Risk   (mild-mod)    Diet Recommendation Thin (regular)   Medication Administration: Whole meds with liquid Compensations: Slow rate;Small sips/bites    Other  Recommendations Oral Care Recommendations: Oral care BID   Follow Up Recommendations       Frequency and Duration        Pertinent Vitals/Pain Abdominal discomfort, RN aware      Swallow Study           Oral/Motor/Sensory Function Overall Oral Motor/Sensory Function: Appears within functional limits for tasks assessed   Ice Chips Ice chips: Not tested   Thin Liquid Thin Liquid: Within functional limits Presentation: Straw    Nectar Thick Nectar Thick Liquid: Not tested   Honey Thick Honey Thick Liquid: Not tested   Puree Puree: Not tested   Solid   GO    Solid: Within functional limits       Houston Siren 02/21/2015,2:19 PM  Orbie Pyo Colvin Caroli.Ed [employer] [phone]

## 2015-02-22 ENCOUNTER — Encounter: Payer: PRIVATE HEALTH INSURANCE | Admitting: Physician Assistant

## 2015-02-22 LAB — COMPREHENSIVE METABOLIC PANEL
ALBUMIN: 2.5 g/dL — AB (ref 3.5–5.0)
ALT: 23 U/L (ref 14–54)
AST: 20 U/L (ref 15–41)
Alkaline Phosphatase: 98 U/L (ref 38–126)
Anion gap: 9 (ref 5–15)
BUN: 13 mg/dL (ref 6–20)
CALCIUM: 8.1 mg/dL — AB (ref 8.9–10.3)
CO2: 24 mmol/L (ref 22–32)
Chloride: 99 mmol/L — ABNORMAL LOW (ref 101–111)
Creatinine, Ser: 0.38 mg/dL — ABNORMAL LOW (ref 0.44–1.00)
GFR calc non Af Amer: >60 mL/min (ref >60)
Glucose, Bld: 83 mg/dL (ref 65–99)
POTASSIUM: 3.8 mmol/L (ref 3.5–5.1)
Sodium: 132 mmol/L — ABNORMAL LOW (ref 135–145)
TOTAL PROTEIN: 4.7 g/dL — AB (ref 6.5–8.1)
Total Bilirubin: 0.9 mg/dL (ref 0.3–1.2)

## 2015-02-22 LAB — CBC
HCT: 34.3 % — ABNORMAL LOW (ref 36.0–46.0)
Hemoglobin: 11.7 g/dL — ABNORMAL LOW (ref 12.0–15.0)
MCH: 31.4 pg (ref 26.0–34.0)
MCHC: 34.1 g/dL (ref 30.0–36.0)
MCV: 92 fL (ref 78.0–100.0)
PLATELETS: 182 10*3/uL (ref 150–400)
RBC: 3.73 MIL/uL — ABNORMAL LOW (ref 3.87–5.11)
RDW: 15.5 % (ref 11.5–15.5)
WBC: 10.2 10*3/uL (ref 4.0–10.5)

## 2015-02-22 LAB — TSH: TSH: 5.38 u[IU]/mL — ABNORMAL HIGH (ref 0.350–4.500)

## 2015-02-22 MED ORDER — POTASSIUM CHLORIDE CRYS ER 20 MEQ PO TBCR
40.0000 meq | EXTENDED_RELEASE_TABLET | Freq: Once | ORAL | Status: AC
  Administered 2015-02-22: 40 meq via ORAL
  Filled 2015-02-22: qty 2

## 2015-02-22 MED ORDER — POLYETHYLENE GLYCOL 3350 17 G PO PACK
17.0000 g | PACK | Freq: Two times a day (BID) | ORAL | Status: DC
  Administered 2015-02-22 – 2015-03-02 (×10): 17 g via ORAL
  Filled 2015-02-22 (×18): qty 1

## 2015-02-22 MED ORDER — PEG 3350-KCL-NA BICARB-NACL 420 G PO SOLR
200.0000 mL | ORAL | Status: AC
  Administered 2015-02-22 (×6): 200 mL via ORAL
  Filled 2015-02-22: qty 4000

## 2015-02-22 MED ORDER — FLEET ENEMA 7-19 GM/118ML RE ENEM
1.0000 | ENEMA | Freq: Once | RECTAL | Status: AC
  Administered 2015-02-22: 1 via RECTAL
  Filled 2015-02-22: qty 1

## 2015-02-22 NOTE — Progress Notes (Signed)
Attempted again to give retention enema but patient vehemently refused. Kamin Niblack, Wonda Cheng, Therapist, sports

## 2015-02-22 NOTE — Progress Notes (Addendum)
DHIYA SMITS WSF:[id number] DOB: 04/07/1927 DOA: 02/17/2015 PCP: Cari Caraway, MD  Brief narrative: 81 y/o ? recent admission 3.28-->12/22/14 Antral gastritis 2/2 Aleve, Chr Afib [place] score 4 not on AC 2/2 GIB, Hyponatremia, remote h/o breast cancer, prior severe fall resulting epistaxis + facial trauma 02/2009, prior R biceps + supraspinatus tears admitted overnight 0/17/49 with metabolic encephalopathy and accidental fall. Allegedly blood was found down her hallway furniture and pieces of pictures were knocked off the shelves-posterior scalp laceration was stapled in the emergency room. Found to have Enterococcal Pyelo and subsequently noted to have colonic ileus    Assessment/Plan:    1. Fall suspected due to UTI. Neuro exam is non focal. CT head: no acute findings. Scalp laceration stapled in ED -treat UTI, PT/OT eval, completed, no events on tele thus far  2. Enterococcus UTI: was treated with Rocephin, then zosyn,now augmentin to complete course  3. Colonic Ileus: With Significant Abd distention, noted on KUB 5/31, with large colonic stool burden -Repeat enema attempted multiple times 6/1 but patient refused per staff -CT abdomen pelvis with contrast 6/1 with marked distention of the colon with gas, large volume stool, and fluid, presumably adynamic ileus, agrees to fleets enema today, will attempt today and add laxatives She has chronic constipation, followed by Dr. Paulita Fujita and has been on Amitiza for this in the past. -Clears for now given overall lack of symptoms -h/o colonoscopy 30years ago (1987) and refuses ever having another one in the future -i think she needs a nonurgent colonoscopy -KUB in am -Addendum: case discussed with Dr.Buccini: will try Nulytely 1 glass q hour while awake today, add KCL, repeat KUB in am and re-assess  4. Toxic metabolic encephalopathy: -due to UTI improving    5. Early Dementia vs Mild cognitive dysfunction; likely also affected by UTI to some  extent -Dr.Samtani consulted psychiatry; for capacity as well ; poor insight and has short-term memory problems as well  -now agreeable to SNF  6.  Moderate AS -outpatient Cards eval -resumed low dose BB  7. Paroxysmal atrial fibrillation, [place] score 4-not on anticoagulation because of GI bleed, falls. Continue metoprolol 25 twice a day-currently rate controlled.  8.  Remote history of breast cancer-outpatient surveillance  DVT proph: lovenox  Code Status: Full Family Communication: no family at bedside, [place] d/w sister x2 so far, i called and left her a msg today 6/2 Disposition Plan: inpatient, SNF in 1-2days if stable   Domenic Polite, MD  [facility] Pager [phone] 02/22/2015, 2:58 PM    LOS: 5 days       Past medical history-As per Problem list Chart reviewed as below- Reviewed  Consultants:  Reviewed  Procedures:  None  Antibiotics:  IV ceftriaxone 5/28 Urine culture enterococcus   Subjective  Small Bm after enema yesterday still with signif abd distension     Objective    Objective: Filed Vitals:   02/21/15 1000 02/21/15 2042 02/22/15 0546 02/22/15 0748  BP: 142/73 107/67 121/64 123/68  Pulse: 70 70 72 71  Temp: 97.8 F (36.6 C) 97.7 F (36.5 C) 97.7 F (36.5 C) 97.7 F (36.5 C)  TempSrc: Oral   Oral  Resp: 18 18 18 17   Height:      Weight:  56.11 kg (123 lb 11.2 oz)    SpO2: 100% 97% 97% 99%    Intake/Output Summary (Last 24 hours) at 02/22/15 1458 Last data filed at 02/22/15 1340  Gross per 24 hour  Intake   2340 ml  Output      0 ml  Net   2340 ml    Exam:  Gen: AAOx3, no distress HEENT: PERRLA Stitches on the back of the head/sutures appear intact and clean with mild oozing CVS: [id number], systolic murmur Chest CTAB Abdomen soft nontender, very distended, BS decreased but present No edema    Data Reviewed: Basic Metabolic Panel:  Recent Labs Lab 02/17/15 1441 02/19/15 0600 02/20/15 1305 02/21/15 1230  02/22/15 0518  NA 132* 132* 134* 132* 132*  K 3.6 3.2* 2.5* 4.4 3.8  CL 90* 96* 97* 99* 99*  CO2 28 27 26 23 24   GLUCOSE 92 84 97 180* 83  BUN 11 14 11 11 13   CREATININE 0.56 0.53 0.35* 0.53 0.38*  CALCIUM 8.5* 8.1* 7.9* 8.1* 8.1*   Liver Function Tests:  Recent Labs Lab 02/17/15 1441 02/20/15 1305 02/22/15 0518  AST 44* 23 20  ALT 38 27 23  ALKPHOS 129* 99 98  BILITOT 1.5* 1.3* 0.9  PROT 6.7 5.1* 4.7*  ALBUMIN 3.3* 2.8* 2.5*   No results for input(s): LIPASE, AMYLASE in the last 168 hours.  Recent Labs Lab 02/20/15 1305  AMMONIA 13   CBC:  Recent Labs Lab 02/17/15 1441 02/21/15 1230 02/22/15 0518  WBC 9.4 8.8 10.2  NEUTROABS 8.5*  --   --   HGB 14.2 13.3 11.7*  HCT 40.1 38.5 34.3*  MCV 91.1 91.2 92.0  PLT 170 238 182   Cardiac Enzymes: No results for input(s): CKTOTAL, CKMB, CKMBINDEX, TROPONINI in the last 168 hours. BNP: Invalid input(s): POCBNP CBG:  Recent Labs Lab 02/20/15 1132  GLUCAP 100*    Recent Results (from the past 240 hour(s))  Urine culture     Status: None   Collection Time: 02/17/15  3:08 PM  Result Value Ref Range Status   Specimen Description URINE, RANDOM  Final   Special Requests NONE  Final   Colony Count   Final    >=100,000 COLONIES/ML Performed at [facility]    Culture   Final    ENTEROCOCCUS SPECIES Performed at [facility]    Report Status 02/19/2015 FINAL  Final   Organism ID, Bacteria ENTEROCOCCUS SPECIES  Final      Susceptibility   Enterococcus species - MIC*    AMPICILLIN <=2 SENSITIVE Sensitive     LEVOFLOXACIN 1 SENSITIVE Sensitive     NITROFURANTOIN <=16 SENSITIVE Sensitive     VANCOMYCIN 1 SENSITIVE Sensitive     TETRACYCLINE >=16 RESISTANT Resistant     * ENTEROCOCCUS SPECIES  Culture, blood (routine x 2)     Status: None   Collection Time: 02/17/15  6:21 PM  Result Value Ref Range Status   Specimen Description BLOOD BLOOD RIGHT FOREARM  Final   Special Requests BOTTLES DRAWN  AEROBIC AND ANAEROBIC 4 CC  Final   Culture   Final    STAPHYLOCOCCUS SPECIES (COAGULASE NEGATIVE) Note: THE SIGNIFICANCE OF ISOLATING THIS ORGANISM FROM A SINGLE SET OF BLOOD CULTURES WHEN MULTIPLE SETS ARE DRAWN IS UNCERTAIN. PLEASE NOTIFY THE MICROBIOLOGY DEPARTMENT WITHIN ONE WEEK IF SPECIATION AND SENSITIVITIES ARE REQUIRED. Note: Gram Stain Report Called to,Read Back By and Verified With: REBECCA SPARKS 5.29.16  20PM  [place] Performed at [facility]    Report Status 02/19/2015 FINAL  Final  Culture, blood (routine x 2)     Status: None (Preliminary result)   Collection Time: 02/17/15  6:28 PM  Result Value Ref Range Status   Specimen Description  BLOOD RIGHT WRIST  Final   Special Requests BOTTLES DRAWN AEROBIC AND ANAEROBIC 3 CC  Final   Culture   Final           BLOOD CULTURE RECEIVED NO GROWTH TO DATE CULTURE WILL BE HELD FOR 5 DAYS BEFORE ISSUING A FINAL NEGATIVE REPORT Performed at [facility]    Report Status PENDING  Incomplete     Studies:              All Imaging reviewed and is as per above notation   Scheduled Meds: . amoxicillin-clavulanate  1 tablet Oral Q12H  . cholecalciferol  5,000 Units Oral Once per day on Mon Wed Fri  . cycloSPORINE  1 drop Both Eyes BID  . enoxaparin (LOVENOX) injection  30 mg Subcutaneous Q24H  . feeding supplement (RESOURCE BREEZE)  1 Container Oral TID BM  . fentaNYL (SUBLIMAZE) injection  25 mcg Intravenous Once  . lubiprostone  24 mcg Oral BID WC  . magnesium oxide  800 mg Oral Daily  . metoprolol tartrate  25 mg Oral BID  . pantoprazole  40 mg Oral Daily  . sodium chloride  3 mL Intravenous Q12H  . thyroid  60 mg Oral QAC breakfast   Continuous Infusions:

## 2015-02-22 NOTE — Progress Notes (Signed)
02/21/15 21:25 Attempted to give patient milk of mollasses enema but patient refused.Explained  to patient why she  is getting enema but she adamantly refused.NT and RN in the room.Will attempt again in the a.m. Xzavier Swinger, Wonda Cheng, Therapist, sports

## 2015-02-23 ENCOUNTER — Inpatient Hospital Stay (HOSPITAL_COMMUNITY): Payer: Medicare Other

## 2015-02-23 DIAGNOSIS — K567 Ileus, unspecified: Secondary | ICD-10-CM | POA: Diagnosis present

## 2015-02-23 LAB — BASIC METABOLIC PANEL
Anion gap: 5 (ref 5–15)
BUN: 16 mg/dL (ref 6–20)
CALCIUM: 8 mg/dL — AB (ref 8.9–10.3)
CHLORIDE: 100 mmol/L — AB (ref 101–111)
CO2: 27 mmol/L (ref 22–32)
Creatinine, Ser: 0.42 mg/dL — ABNORMAL LOW (ref 0.44–1.00)
Glucose, Bld: 82 mg/dL (ref 65–99)
Potassium: 3.9 mmol/L (ref 3.5–5.1)
Sodium: 132 mmol/L — ABNORMAL LOW (ref 135–145)

## 2015-02-23 LAB — CBC
HCT: 33.4 % — ABNORMAL LOW (ref 36.0–46.0)
Hemoglobin: 11.3 g/dL — ABNORMAL LOW (ref 12.0–15.0)
MCH: 31.2 pg (ref 26.0–34.0)
MCHC: 33.8 g/dL (ref 30.0–36.0)
MCV: 92.3 fL (ref 78.0–100.0)
Platelets: 195 10*3/uL (ref 150–400)
RBC: 3.62 MIL/uL — ABNORMAL LOW (ref 3.87–5.11)
RDW: 15.5 % (ref 11.5–15.5)
WBC: 7.4 10*3/uL (ref 4.0–10.5)

## 2015-02-23 MED ORDER — SORBITOL 70 % SOLN
960.0000 mL | TOPICAL_OIL | Freq: Once | ORAL | Status: AC
  Administered 2015-02-23: 960 mL via RECTAL
  Filled 2015-02-23: qty 240

## 2015-02-23 NOTE — Progress Notes (Signed)
DELICIA BERENS DSK:[id number] DOB: [birth year] DOA: 02/17/2015 PCP: Cari Caraway, MD  Brief narrative: 81 y/o ? recent admission 3.28-->12/22/14 Antral gastritis 2/2 Aleve, Chr Afib [place] score 4 not on AC 2/2 GIB, Hyponatremia, remote h/o breast cancer, prior severe fall resulting epistaxis + facial trauma 02/2009, prior R biceps + supraspinatus tears admitted overnight 03/11/34 with metabolic encephalopathy and accidental fall. Allegedly blood was found down her hallway furniture and pieces of pictures were knocked off the shelves-posterior scalp laceration was stapled in the emergency room. Found to have Enterococcal Pyelo and subsequently noted to have colonic ileus    Assessment/Plan:  1. Fall suspected due to UTI. Neuro exam is non focal. CT head: no acute findings. Scalp laceration stapled in ED -treat UTI, PT/OT eval, completed, no events on tele thus far  2. Enterococcus UTI: was treated with Rocephin, then zosyn,now augmentin to complete course  3. Colonic Ileus: With Significant Abd distention, noted on KUB 5/31, with large colonic stool burden -CT abdomen pelvis with contrast 6/1 with marked distention of the colon with gas, large volume stool, and fluid, presumably adynamic ileus, given fleets enema 6/2,  -case discussed with Dr.Buccini 6/2: started Nulytely 1 glass q hour and KCL, had multiple small liquids BMs -repeat KUB 6/3 with persistent Large bowel ileus and now with increased gas in small bowel as well; not really symptomatic; hold off on NGT today -She has chronic constipation, followed by Dr. Paulita Fujita and has been on Amitiza for this in the past. -h/o colonoscopy 30years ago (1987) and refuses ever having another one in the future -Dr.Buccini to see today-Thank you  4. Toxic metabolic encephalopathy: -due to UTI improved, but has short term memory problems    5. Early Dementia vs Mild cognitive dysfunction; likely also affected by UTI to some extent -Dr.Samtani consulted  psychiatry; for capacity as well ; poor insight and has short-term memory problems as well  -now agreeable to SNF  6.  Moderate AS -outpatient Cards eval -resumed low dose BB  7. Paroxysmal atrial fibrillation, [place] score 4-not on anticoagulation because of GI bleed, falls. Continue metoprolol 25 twice a day-currently rate controlled.  8.  Remote history of breast cancer-outpatient surveillance  DVT proph: lovenox  Code Status: Full Family Communication: no family at bedside, Dr.Samtani d/w sister x2 so far, i called and left her a msg today 6/2 Disposition Plan: inpatient, SNF in 1-2days if stable   Domenic Polite, MD  [facility] Pager [phone] 02/22/2015, 2:58 PM    LOS: 5 days       Past medical history-As per Problem list Chart reviewed as below- Reviewed  Consultants:  Reviewed  Procedures:  None  Antibiotics:  IV ceftriaxone 5/28 Urine culture enterococcus   Subjective  Feels ok, no N/V, multiple liquid stool yesterday and one this am     Objective    Objective: Filed Vitals:   02/22/15 1637 02/22/15 2139 02/23/15 0637 02/23/15 0948  BP: 110/56 107/62 132/71 121/63  Pulse: 73 84 75 67  Temp: 97.7 F (36.5 C) 97.6 F (36.4 C) 97.6 F (36.4 C) 98 F (36.7 C)  TempSrc: Oral Oral Oral Oral  Resp: 16 17 18 18   Height:      Weight:      SpO2: 98% 98% 97% 98%    Intake/Output Summary (Last 24 hours) at 02/23/15 1059 Last data filed at 02/23/15 0900  Gross per 24 hour  Intake    720 ml  Output  0 ml  Net    720 ml    Exam:  Gen: AAOx3, no distress HEENT: PERRLA Stitches on the back of the head/sutures appear intact and clean with mild oozing CVS: D9M4/QAS, systolic murmur Chest CTAB Abdomen soft nontender, a little less distended, more soft today BS incraesed Ext: No edema    Data Reviewed: Basic Metabolic Panel:  Recent Labs Lab 02/19/15 0600 02/20/15 1305 02/21/15 1230 02/22/15 0518 02/23/15 0625  NA 132* 134*  132* 132* 132*  K 3.2* 2.5* 4.4 3.8 3.9  CL 96* 97* 99* 99* 100*  CO2 27 26 23 24 27   GLUCOSE 84 97 180* 83 82  BUN 14 11 11 13 16   CREATININE 0.53 0.35* 0.53 0.38* 0.42*  CALCIUM 8.1* 7.9* 8.1* 8.1* 8.0*   Liver Function Tests:  Recent Labs Lab 02/17/15 1441 02/20/15 1305 02/22/15 0518  AST 44* 23 20  ALT 38 27 23  ALKPHOS 129* 99 98  BILITOT 1.5* 1.3* 0.9  PROT 6.7 5.1* 4.7*  ALBUMIN 3.3* 2.8* 2.5*   No results for input(s): LIPASE, AMYLASE in the last 168 hours.  Recent Labs Lab 02/20/15 1305  AMMONIA 13   CBC:  Recent Labs Lab 02/17/15 1441 02/21/15 1230 02/22/15 0518 02/23/15 0625  WBC 9.4 8.8 10.2 7.4  NEUTROABS 8.5*  --   --   --   HGB 14.2 13.3 11.7* 11.3*  HCT 40.1 38.5 34.3* 33.4*  MCV 91.1 91.2 92.0 92.3  PLT 170 238 182 195   Cardiac Enzymes: No results for input(s): CKTOTAL, CKMB, CKMBINDEX, TROPONINI in the last 168 hours. BNP: Invalid input(s): POCBNP CBG:  Recent Labs Lab 02/20/15 1132  GLUCAP 100*    Recent Results (from the past 240 hour(s))  Urine culture     Status: None   Collection Time: 02/17/15  3:08 PM  Result Value Ref Range Status   Specimen Description URINE, RANDOM  Final   Special Requests NONE  Final   Colony Count   Final    >=100,000 COLONIES/ML Performed at [facility]    Culture   Final    ENTEROCOCCUS SPECIES Performed at [facility]    Report Status 02/19/2015 FINAL  Final   Organism ID, Bacteria ENTEROCOCCUS SPECIES  Final      Susceptibility   Enterococcus species - MIC*    AMPICILLIN <=2 SENSITIVE Sensitive     LEVOFLOXACIN 1 SENSITIVE Sensitive     NITROFURANTOIN <=16 SENSITIVE Sensitive     VANCOMYCIN 1 SENSITIVE Sensitive     TETRACYCLINE >=16 RESISTANT Resistant     * ENTEROCOCCUS SPECIES  Culture, blood (routine x 2)     Status: None   Collection Time: 02/17/15  6:21 PM  Result Value Ref Range Status   Specimen Description BLOOD BLOOD RIGHT FOREARM  Final   Special  Requests BOTTLES DRAWN AEROBIC AND ANAEROBIC 4 CC  Final   Culture   Final    STAPHYLOCOCCUS SPECIES (COAGULASE NEGATIVE) Note: THE SIGNIFICANCE OF ISOLATING THIS ORGANISM FROM A SINGLE SET OF BLOOD CULTURES WHEN MULTIPLE SETS ARE DRAWN IS UNCERTAIN. PLEASE NOTIFY THE MICROBIOLOGY DEPARTMENT WITHIN ONE WEEK IF SPECIATION AND SENSITIVITIES ARE REQUIRED. Note: Gram Stain Report Called to,Read Back By and Verified With: REBECCA SPARKS 5.29.16  59PM  [place] Performed at [facility]    Report Status 02/19/2015 FINAL  Final  Culture, blood (routine x 2)     Status: None (Preliminary result)   Collection Time: 02/17/15  6:28 PM  Result Value Ref Range Status   Specimen Description BLOOD RIGHT WRIST  Final   Special Requests BOTTLES DRAWN AEROBIC AND ANAEROBIC 3 CC  Final   Culture   Final           BLOOD CULTURE RECEIVED NO GROWTH TO DATE CULTURE WILL BE HELD FOR 5 DAYS BEFORE ISSUING A FINAL NEGATIVE REPORT Performed at [facility]    Report Status PENDING  Incomplete     Studies:              All Imaging reviewed and is as per above notation   Scheduled Meds: . amoxicillin-clavulanate  1 tablet Oral Q12H  . cholecalciferol  5,000 Units Oral Once per day on Mon Wed Fri  . cycloSPORINE  1 drop Both Eyes BID  . enoxaparin (LOVENOX) injection  30 mg Subcutaneous Q24H  . feeding supplement (RESOURCE BREEZE)  1 Container Oral TID BM  . fentaNYL (SUBLIMAZE) injection  25 mcg Intravenous Once  . lubiprostone  24 mcg Oral BID WC  . magnesium oxide  800 mg Oral Daily  . metoprolol tartrate  25 mg Oral BID  . pantoprazole  40 mg Oral Daily  . polyethylene glycol  17 g Oral BID  . sodium chloride  3 mL Intravenous Q12H  . thyroid  60 mg Oral QAC breakfast   Continuous Infusions:

## 2015-02-23 NOTE — Clinical Social Work Placement (Signed)
   CLINICAL SOCIAL WORK PLACEMENT  NOTE  Date:  02/23/2015  Patient Details  Name: [name] MRN: [id number] Date of Birth: Oct 23, 1926  Clinical Social Work is seeking post-discharge placement for this patient at the [place] level of care (*CSW will initial, date and re-position this form in  chart as items are completed):  Yes   Patient/family provided with [facility] Work Department's list of facilities offering this level of care within the geographic area requested by the patient (or if unable, by the patient's family).  Yes   Patient/family informed of their freedom to choose among providers that offer the needed level of care, that participate in Medicare, Medicaid or managed care program needed by the patient, have an available bed and are willing to accept the patient.  Yes   Patient/family informed of [facility]'s ownership interest in [facility] and [facility], as well as of the fact that they are under no obligation to receive care at these facilities.  PASRR submitted to EDS on       PASRR number received on       Existing PASRR number confirmed on 02/20/15     FL2 transmitted to all facilities in geographic area requested by pt/family on 02/20/15     FL2 transmitted to all facilities within larger geographic area on       Patient informed that his/her managed care company has contracts with or will negotiate with certain facilities, including the following:        Yes   Patient/family informed of bed offers received.  Patient chooses bed at [facility]     Physician recommends and patient chooses bed at      Patient to be transferred to [facility] on  .  Patient to be transferred to facility by Ambulance     Patient family notified on   of transfer.  Name of family member notified:        PHYSICIAN Please prepare priority discharge summary, including medications, Please prepare  prescriptions     Additional Comment:  02/23/15 - Facility [facility]) admissions director Deirdre Pippins) is aware that patient may be a weekend discharge.   _______________________________________________ Sable Feil, LCSW 02/23/2015, 5:35 PM

## 2015-02-23 NOTE — Progress Notes (Signed)
Subjective: Patient well-known to me.  Has chronic constipation and chronic colonic dilatation, dating back at least a couple years.  These symptoms have worsened since recent fall.  No nausea, vomiting or obstructive symptoms; CT likewise has not shown obstruction, but has shown significant fecal burden.  Objective: Vital signs in last 24 hours: Temp:  [97.6 F (36.4 C)-98 F (36.7 C)] 98 F (36.7 C) (06/03 0948) Pulse Rate:  [67-84] 67 (06/03 0948) Resp:  [16-18] 18 (06/03 0948) BP: (107-132)/(56-71) 121/63 mmHg (06/03 0948) SpO2:  [97 %-98 %] 98 % (06/03 0948) Weight change:  Last BM Date: 02/22/15  PE: GEN:  NAD, intermittently confused during my interview ABD:  More distended and tympanic compared to her chronic baseline; hypoactive bowel sounds; no peritonitis.  Lab Results: CBC    Component Value Date/Time   WBC 7.4 02/23/2015 0625   RBC 3.62* 02/23/2015 0625   HGB 11.3* 02/23/2015 0625   HCT 33.4* 02/23/2015 0625   PLT 195 02/23/2015 0625   MCV 92.3 02/23/2015 0625   MCH 31.2 02/23/2015 0625   MCHC 33.8 02/23/2015 0625   RDW 15.5 02/23/2015 0625   LYMPHSABS 0.4* 02/17/2015 1441   MONOABS 0.5 02/17/2015 1441   EOSABS 0.0 02/17/2015 1441   BASOSABS 0.0 02/17/2015 1441   CMP     Component Value Date/Time   NA 132* 02/23/2015 0625   K 3.9 02/23/2015 0625   CL 100* 02/23/2015 0625   CO2 27 02/23/2015 0625   GLUCOSE 82 02/23/2015 0625   BUN 16 02/23/2015 0625   CREATININE 0.42* 02/23/2015 0625   CALCIUM 8.0* 02/23/2015 0625   PROT 4.7* 02/22/2015 0518   ALBUMIN 2.5* 02/22/2015 0518   AST 20 02/22/2015 0518   ALT 23 02/22/2015 0518   ALKPHOS 98 02/22/2015 0518   BILITOT 0.9 02/22/2015 0518   GFRNONAA >60 02/23/2015 0625   GFRAA >60 02/23/2015 0625   Studies/Results: Dg Abd 1 View  02/23/2015   CLINICAL DATA:  Ileus, abdominal distention, back pain  EXAM: ABDOMEN - 1 VIEW  COMPARISON:  Abdominopelvic CT scan of February 21, 2015 and acute abdominal series of Feb 20, 2015.  FINDINGS: There remain loops of markedly distended small and large bowel with the sigmoid being the most distended. This has not appreciably changed since the previous studies. No free extraluminal gas collections are demonstrated.  There is calcification in the wall of the lower abdominal aorta and common iliac vessels. There degenerative changes of the lumbar spine. Calcifications in the pelvis likely lie within the uterus.  IMPRESSION: Persistent diffuse ileus without evidence of perforation. Nasogastric suction may be useful.   Electronically Signed   By: David  Martinique M.D.   On: 02/23/2015 07:38   Ct Abdomen Pelvis W Contrast  02/21/2015   CLINICAL DATA:  Abdominal pain, distention.  EXAM: CT ABDOMEN AND PELVIS WITH CONTRAST  TECHNIQUE: Multidetector CT imaging of the abdomen and pelvis was performed using the standard protocol following bolus administration of intravenous contrast.  CONTRAST:  68mL OMNIPAQUE IOHEXOL 300 MG/ML  SOLN  COMPARISON:  Plain films 02/20/2015  FINDINGS: Lower chest: There are small to moderate bilateral pleural effusions with compressive atelectasis in the lower lobes. Heart is normal size.  Hepatobiliary: Multiple low-density lesions throughout the liver compatible with cysts. Gallbladder unremarkable. No biliary duct dilatation.  Pancreas: No focal abnormality or ductal dilatation.  Spleen: No focal abnormality.  Normal size.  Adrenals/Urinary Tract: No focal renal or adrenal abnormality. No hydronephrosis. Urinary bladder is unremarkable.  Stomach/Bowel: Marked distention of the colon diffusely with stool, fluid and gas. Findings presumably represent adynamic ileus. Stomach and small bowel are decompressed.  Vascular/Lymphatic: Aorta and iliac vessels are calcified, non aneurysmal.  Reproductive: No mass or other significant abnormality.  Other: Perihepatic and perisplenic ascites noted. Edema throughout the mesentery and abdominal wall with anasarca type pattern.   Musculoskeletal: Multi level moderate to severe compression fractures throughout the lumbar spine and lower thoracic spine.  IMPRESSION: Marked distention of the colon with gas, large volume stool, and fluid, presumably adynamic ileus.  Small to moderate bilateral effusions with compressive atelectasis in the lower lobes.  Mild ascites. Edema throughout the abdominal wall and mesentery/peritoneal with anasarca like pattern.   Electronically Signed   By: Rolm Baptise M.D.   On: 02/21/2015 17:04    Assessment:  1.  Constipation/obstipation. 2.  Chronic ileus, worse after recent fall.  Plan:  1.  Continue slow peroral bowel preparation. 2.  Trial of SMOG enemas. 3.  Will revisit tomorrow.   Landry Dyke 02/23/2015, 3:14 PM   Pager [phone] If no answer or after 5 PM call [phone]

## 2015-02-23 NOTE — Progress Notes (Signed)
Physical Therapy Treatment Patient Details Name: [name] MRN: [id number] DOB: [birth year] Today's Date: 02/23/2015    History of Present Illness [name] is a 81 y.o. female with PMH of HTN, Hypothyroidism, Aortic stenosis, PAF (not on anticoagulation due to GIB) presented with unwitnessed fall, laceration to back of head. Patient lives alone, could not remember the fall. Per EMS: patient had blood down the hallway and furniture/pictures knocked off their shelves.    PT Comments    Pt was seen for evaluation of her current mobility and declined to get OOB.  Reporting nausea and is drinking an electrolyte replacement with some resulting low tolerance for activity.  Pt agreed to bed ex's but was quite tired and finally could not do any more.  Follow Up Recommendations  SNF     Equipment Recommendations  Rolling walker with 5" wheels    Recommendations for Other Services       Precautions / Restrictions Precautions Precautions: Fall Restrictions Weight Bearing Restrictions: No    Mobility  Bed Mobility Overal bed mobility: Needs Assistance Bed Mobility: Rolling (assist to slide trunk over to correct posture) Rolling: Mod assist         General bed mobility comments: Mod assist today to elevate trunk from sidelying to sit due to abdominal discomfort; heavy dependence on UEs  Transfers Overall transfer level: Needs assistance               General transfer comment: declined OOB  Ambulation/Gait                 Stairs            Wheelchair Mobility    Modified Rankin (Stroke Patients Only)       Balance                                    Cognition Arousal/Alertness: Awake/alert Behavior During Therapy: WFL for tasks assessed/performed Overall Cognitive Status: Impaired/Different from baseline Area of Impairment: Orientation Orientation Level: Situation   Memory: Decreased recall of precautions;Decreased short-term  memory              Exercises General Exercises - Lower Extremity Ankle Circles/Pumps: AROM;5 reps Quad Sets: AROM;Both;10 reps Heel Slides: AROM;Both;10 reps Hip ABduction/ADduction: AROM;Both;10 reps    General Comments General comments (skin integrity, edema, etc.): Pt is unable to adequately adjust her bed posture without a greatdeal of help and does not notice it or ask nursing for help to fix      Pertinent Vitals/Pain Pain Assessment: Faces Faces Pain Scale: Hurts even more Pain Location: back at times with movement Pain Intervention(s): Limited activity within patient's tolerance;Monitored during session;Repositioned;Patient requesting pain meds-RN notified    Home Living                      Prior Function            PT Goals (current goals can now be found in the care plan section) Acute Rehab PT Goals Patient Stated Goal: wants to be independent Progress towards PT goals: Progressing toward goals    Frequency  Min 3X/week    PT Plan Current plan remains appropriate    Co-evaluation             End of Session   Activity Tolerance: Patient tolerated treatment well Patient left: in bed;with call bell/phone within reach;with bed  alarm set     Time: 1425-1445 PT Time Calculation (min) (ACUTE ONLY): 20 min  Charges:  $Therapeutic Exercise: 8-22 mins                    G Codes:      Ramond Dial 03-12-2015, 3:13 PM   Mee Hives, PT MS Acute Rehab Dept. Number: [facility] [phone] and [place] [phone]

## 2015-02-24 ENCOUNTER — Inpatient Hospital Stay (HOSPITAL_COMMUNITY): Payer: Medicare Other

## 2015-02-24 LAB — CULTURE, BLOOD (ROUTINE X 2): Culture: NO GROWTH

## 2015-02-24 MED ORDER — HYDROMORPHONE HCL 1 MG/ML IJ SOLN
0.5000 mg | INTRAMUSCULAR | Status: DC | PRN
  Administered 2015-02-25: 0.5 mg via INTRAVENOUS
  Filled 2015-02-24: qty 1

## 2015-02-24 MED ORDER — SORBITOL 70 % SOLN
960.0000 mL | TOPICAL_OIL | Freq: Once | ORAL | Status: AC
  Administered 2015-02-24: 960 mL via RECTAL
  Filled 2015-02-24: qty 240

## 2015-02-24 NOTE — Progress Notes (Addendum)
Assisted out of bed to bsc, had large volume liquid stool.

## 2015-02-24 NOTE — Progress Notes (Signed)
Patient moaning with abdominal pain, verbalizing that she feels like she is going to pass out and her belly is going to explode.  Abdomin more distended and tight than previous assessment today. Dr. Broadus John notified.

## 2015-02-24 NOTE — Progress Notes (Signed)
Subjective: Had several bowel movements yesterday. Feels less distended  Objective: Vital signs in last 24 hours: Temp:  [97.6 F (36.4 C)-98.7 F (37.1 C)] 98.2 F (36.8 C) (06/04 0931) Pulse Rate:  [70-85] 81 (06/04 0931) Resp:  [17-20] 20 (06/04 0931) BP: (93-127)/(53-70) 124/65 mmHg (06/04 0931) SpO2:  [96 %-99 %] 98 % (06/04 0931) Weight:  [25.946 kg (57 lb 3.2 oz)] 25.946 kg (57 lb 3.2 oz) (06/04 0534) Weight change:  Last BM Date: 02/23/15  PE: GEN:  More alert and oriented today ABD:  Moderate distention but significantly improved from yesterday, tympanic, mild tenderness without peritonitis, scant bowel sounds  Lab Results: CBC    Component Value Date/Time   WBC 7.4 02/23/2015 0625   RBC 3.62* 02/23/2015 0625   HGB 11.3* 02/23/2015 0625   HCT 33.4* 02/23/2015 0625   PLT 195 02/23/2015 0625   MCV 92.3 02/23/2015 0625   MCH 31.2 02/23/2015 0625   MCHC 33.8 02/23/2015 0625   RDW 15.5 02/23/2015 0625   LYMPHSABS 0.4* 02/17/2015 1441   MONOABS 0.5 02/17/2015 1441   EOSABS 0.0 02/17/2015 1441   BASOSABS 0.0 02/17/2015 1441   CMP     Component Value Date/Time   NA 132* 02/23/2015 0625   K 3.9 02/23/2015 0625   CL 100* 02/23/2015 0625   CO2 27 02/23/2015 0625   GLUCOSE 82 02/23/2015 0625   BUN 16 02/23/2015 0625   CREATININE 0.42* 02/23/2015 0625   CALCIUM 8.0* 02/23/2015 0625   PROT 4.7* 02/22/2015 0518   ALBUMIN 2.5* 02/22/2015 0518   AST 20 02/22/2015 0518   ALT 23 02/22/2015 0518   ALKPHOS 98 02/22/2015 0518   BILITOT 0.9 02/22/2015 0518   GFRNONAA >60 02/23/2015 0625   GFRAA >60 02/23/2015 5462   Assessment:  1.  Ileus.  Chronic problem, but acutely worsened after recent fall. 2.  Constipation/obstipation.  Plan:  1.  Slowly advance diet. 2.  Give another SMOG enema today.  Continue Miralax 17 g po bid. 3.  Recheck abdominal xray tomorrow; if not significantly improved, may need to consider another slow peroral bowel prep. 4.  Will  follow.   Landry Dyke 02/24/2015, 12:24 PM   Pager [phone] If no answer or after 5 PM call [phone]

## 2015-02-24 NOTE — Progress Notes (Signed)
[name] PPJ:[id number] DOB: 1927-04-19 DOA: 02/17/2015 PCP: Cari Caraway, MD  Brief narrative: 81 y/o ? recent admission 3.28-->12/22/14 Antral gastritis 2/2 Aleve, Chr Afib [place] score 4 not on AC 2/2 GIB, Hyponatremia, remote h/o breast cancer, prior severe fall resulting epistaxis + facial trauma 02/2009, prior R biceps + supraspinatus tears admitted overnight 5/80/99 with metabolic encephalopathy and accidental fall. Allegedly blood was found down her hallway furniture and pieces of pictures were knocked off the shelves-posterior scalp laceration was stapled in the emergency room. Found to have Enterococcal Pyelo and subsequently noted to have colonic ileus    Assessment/Plan:  1. Fall suspected due to UTI. Neuro exam is non focal. CT head: no acute findings. Scalp laceration stapled in ED -treat UTI, PT/OT eval, completed, no events on tele thus far  2. Enterococcus UTI: was treated with Rocephin, then zosyn,now augmentin to complete course  3. Colonic Ileus: With Significant Abd distention, noted on KUB 5/31, with large colonic stool burden -CT abdomen pelvis with contrast 6/1 with marked distention of the colon with gas, large volume stool, and fluid, presumably adynamic ileus, given fleets enema 6/2,  -case discussed with Dr.Buccini 6/2: started Nulytely 1 glass q hour and KCL, had multiple small liquids BMs -repeat KUB 6/3 with persistent Large bowel ileus and now with increased gas in small bowel as well; not really symptomatic; Appreciate Gi input; given smog enema, repeat KUB today; needs a good bowel regimen for DC -She has chronic constipation, followed by Dr. Paulita Fujita and has been on Amitiza for this in the past. -h/o colonoscopy 30years ago (1987) and refuses ever having another one in the future  4. Toxic metabolic encephalopathy: -due to UTI improved, but has short term memory problems    5. Early Dementia vs Mild cognitive dysfunction; likely also affected by UTI to  some extent -Dr.Samtani consulted psychiatry; for capacity as well ; poor insight and has short-term memory problems as well  -now agreeable to SNF  6.  Moderate AS -outpatient Cards eval -resumed low dose BB  7. Paroxysmal atrial fibrillation, [place] score 4-not on anticoagulation because of GI bleed, falls. Continue metoprolol 25 twice a day-currently rate controlled.  8.  Remote history of breast cancer-outpatient surveillance  DVT proph: lovenox  Code Status: Full Family Communication: no family at bedside, [place] d/w sister x2 so far, i called and left her a msg today 6/2 Disposition Plan: inpatient, SNF tomorrow or Monday  Domenic Polite, MD  [facility] Pager [phone] 02/22/2015, 2:58 PM    LOS: 5 days       Past medical history-As per Problem list Chart reviewed as below- Reviewed  Consultants:  Reviewed  Procedures:  None  Antibiotics:  IV ceftriaxone 5/28 Urine culture enterococcus   Subjective  Had enema yetserday, multiple liquid stool yesterday and this am     Objective    Objective: Filed Vitals:   02/23/15 1800 02/23/15 2051 02/24/15 0534 02/24/15 0931  BP: 124/70 93/53 127/70 124/65  Pulse: 70 85 76 81  Temp: 98.2 F (36.8 C) 97.6 F (36.4 C) 98.7 F (37.1 C) 98.2 F (36.8 C)  TempSrc: Oral Oral Oral Oral  Resp: 18 18 17 20   Height:      Weight:   25.946 kg (57 lb 3.2 oz)   SpO2: 96% 99% 97% 98%    Intake/Output Summary (Last 24 hours) at 02/24/15 0949 Last data filed at 02/24/15 0900  Gross per 24 hour  Intake    440 ml  Output      0 ml  Net    440 ml    Exam:  Gen: AAOx3, no distress HEENT: PERRLA Stitches on the back of the head/sutures appear intact and clean with mild oozing CVS: C5E5/IDP, systolic murmur Chest CTAB Abdomen soft nontender,  less distended, more soft today BS incraesed Ext: No edema    Data Reviewed: Basic Metabolic Panel:  Recent Labs Lab 02/19/15 0600 02/20/15 1305  02/21/15 1230 02/22/15 0518 02/23/15 0625  NA 132* 134* 132* 132* 132*  K 3.2* 2.5* 4.4 3.8 3.9  CL 96* 97* 99* 99* 100*  CO2 27 26 23 24 27   GLUCOSE 84 97 180* 83 82  BUN 14 11 11 13 16   CREATININE 0.53 0.35* 0.53 0.38* 0.42*  CALCIUM 8.1* 7.9* 8.1* 8.1* 8.0*   Liver Function Tests:  Recent Labs Lab 02/17/15 1441 02/20/15 1305 02/22/15 0518  AST 44* 23 20  ALT 38 27 23  ALKPHOS 129* 99 98  BILITOT 1.5* 1.3* 0.9  PROT 6.7 5.1* 4.7*  ALBUMIN 3.3* 2.8* 2.5*   No results for input(s): LIPASE, AMYLASE in the last 168 hours.  Recent Labs Lab 02/20/15 1305  AMMONIA 13   CBC:  Recent Labs Lab 02/17/15 1441 02/21/15 1230 02/22/15 0518 02/23/15 0625  WBC 9.4 8.8 10.2 7.4  NEUTROABS 8.5*  --   --   --   HGB 14.2 13.3 11.7* 11.3*  HCT 40.1 38.5 34.3* 33.4*  MCV 91.1 91.2 92.0 92.3  PLT 170 238 182 195   Cardiac Enzymes: No results for input(s): CKTOTAL, CKMB, CKMBINDEX, TROPONINI in the last 168 hours. BNP: Invalid input(s): POCBNP CBG:  Recent Labs Lab 02/20/15 1132  GLUCAP 100*    Recent Results (from the past 240 hour(s))  Urine culture     Status: None   Collection Time: 02/17/15  3:08 PM  Result Value Ref Range Status   Specimen Description URINE, RANDOM  Final   Special Requests NONE  Final   Colony Count   Final    >=100,000 COLONIES/ML Performed at [facility]    Culture   Final    ENTEROCOCCUS SPECIES Performed at [facility]    Report Status 02/19/2015 FINAL  Final   Organism ID, Bacteria ENTEROCOCCUS SPECIES  Final      Susceptibility   Enterococcus species - MIC*    AMPICILLIN <=2 SENSITIVE Sensitive     LEVOFLOXACIN 1 SENSITIVE Sensitive     NITROFURANTOIN <=16 SENSITIVE Sensitive     VANCOMYCIN 1 SENSITIVE Sensitive     TETRACYCLINE >=16 RESISTANT Resistant     * ENTEROCOCCUS SPECIES  Culture, blood (routine x 2)     Status: None   Collection Time: 02/17/15  6:21 PM  Result Value Ref Range Status   Specimen  Description BLOOD BLOOD RIGHT FOREARM  Final   Special Requests BOTTLES DRAWN AEROBIC AND ANAEROBIC 4 CC  Final   Culture   Final    STAPHYLOCOCCUS SPECIES (COAGULASE NEGATIVE) Note: THE SIGNIFICANCE OF ISOLATING THIS ORGANISM FROM A SINGLE SET OF BLOOD CULTURES WHEN MULTIPLE SETS ARE DRAWN IS UNCERTAIN. PLEASE NOTIFY THE MICROBIOLOGY DEPARTMENT WITHIN ONE WEEK IF SPECIATION AND SENSITIVITIES ARE REQUIRED. Note: Gram Stain Report Called to,Read Back By and Verified With: REBECCA SPARKS 5.29.16  27PM  [place] Performed at [facility]    Report Status 02/19/2015 FINAL  Final  Culture, blood (routine x 2)     Status: None   Collection Time: 02/17/15  6:28 PM  Result Value Ref Range Status   Specimen Description BLOOD RIGHT WRIST  Final   Special Requests BOTTLES DRAWN AEROBIC AND ANAEROBIC 3 CC  Final   Culture   Final    NO GROWTH 5 DAYS Performed at [facility]    Report Status 02/24/2015 FINAL  Final     Studies:              All Imaging reviewed and is as per above notation   Scheduled Meds: . cholecalciferol  5,000 Units Oral Once per day on Mon Wed Fri  . cycloSPORINE  1 drop Both Eyes BID  . enoxaparin (LOVENOX) injection  30 mg Subcutaneous Q24H  . feeding supplement (RESOURCE BREEZE)  1 Container Oral TID BM  . fentaNYL (SUBLIMAZE) injection  25 mcg Intravenous Once  . lubiprostone  24 mcg Oral BID WC  . magnesium oxide  800 mg Oral Daily  . metoprolol tartrate  25 mg Oral BID  . pantoprazole  40 mg Oral Daily  . polyethylene glycol  17 g Oral BID  . sodium chloride  3 mL Intravenous Q12H  . thyroid  60 mg Oral QAC breakfast   Continuous Infusions:

## 2015-02-25 ENCOUNTER — Inpatient Hospital Stay (HOSPITAL_COMMUNITY): Payer: Medicare Other

## 2015-02-25 NOTE — Progress Notes (Signed)
Subjective: Some liquid stools with dulcolax suppositories and second SMOG enemas. No significant change in her abdominal distention.  Objective: Vital signs in last 24 hours: Temp:  [97.6 F (36.4 C)-98 F (36.7 C)] 97.6 F (36.4 C) (06/05 1024) Pulse Rate:  [79-96] 84 (06/05 1024) Resp:  [17-18] 18 (06/05 1024) BP: (125-155)/(66-81) 125/66 mmHg (06/05 1024) SpO2:  [92 %-97 %] 96 % (06/05 1024) Weight:  [54.432 kg (120 lb)] 54.432 kg (120 lb) (06/04 2124) Weight change: 28.486 kg (62 lb 12.8 oz) Last BM Date: 02/24/15  PE: GEN:  Intermittently confused ABD:  Distended, protuberant, tympanic; however, no significant change from yesterday; no abdominal tenderness; no peritonitis; high pitched bowel sounds noted  Lab Results: CBC    Component Value Date/Time   WBC 7.4 02/23/2015 0625   RBC 3.62* 02/23/2015 0625   HGB 11.3* 02/23/2015 0625   HCT 33.4* 02/23/2015 0625   PLT 195 02/23/2015 0625   MCV 92.3 02/23/2015 0625   MCH 31.2 02/23/2015 0625   MCHC 33.8 02/23/2015 0625   RDW 15.5 02/23/2015 0625   LYMPHSABS 0.4* 02/17/2015 1441   MONOABS 0.5 02/17/2015 1441   EOSABS 0.0 02/17/2015 1441   BASOSABS 0.0 02/17/2015 1441   CMP     Component Value Date/Time   NA 132* 02/23/2015 0625   K 3.9 02/23/2015 0625   CL 100* 02/23/2015 0625   CO2 27 02/23/2015 0625   GLUCOSE 82 02/23/2015 0625   BUN 16 02/23/2015 0625   CREATININE 0.42* 02/23/2015 0625   CALCIUM 8.0* 02/23/2015 0625   PROT 4.7* 02/22/2015 0518   ALBUMIN 2.5* 02/22/2015 0518   AST 20 02/22/2015 0518   ALT 23 02/22/2015 0518   ALKPHOS 98 02/22/2015 0518   BILITOT 0.9 02/22/2015 0518   GFRNONAA >60 02/23/2015 0625   GFRAA >60 02/23/2015 0211   Studies/Results: Abdominal xray today: no change in small and large bowel dilatation; I have personally reviewed these films  Assessment:  1.  Constipation/obstipation.  Chronic process, worse after recent fall. 2.  Ileus.  Chronic process, suspect  pseudoobstruction, worse after recent fall.  Not seeming to make much process despite peroral bowel preparation, SMOG enemas, and dulcolax suppositories.  Plan:  1.  Place rectal tube today. 2.  Will repeat xray and follow-up clinically tomorrow. 3.  If no significant improvement despite rectal foley placement, patient might benefit from decompressive colonoscopy. 4.  Will follow.   Landry Dyke 02/25/2015, 2:32 PM   Pager [phone] If no answer or after 5 PM call [phone]

## 2015-02-25 NOTE — Progress Notes (Signed)
Patient discussed with Dr. Broadus John. Not medically stable for d/c at this time. Plan d/c to [facility] when stable.  CSW services will continue to monitor and assist with d/c. Thanks! Lorie Phenix. Pauline Good, [place]

## 2015-02-25 NOTE — Progress Notes (Signed)
[name] SAY:[id number] DOB: Feb 06, 1927 DOA: 02/17/2015 PCP: Cari Caraway, MD  Brief narrative: 81 y/o ? recent admission 3.28-->12/22/14 Antral gastritis 2/2 Aleve, Chr Afib [place] score 4 not on AC 2/2 GIB, Hyponatremia, remote h/o breast cancer, prior severe fall resulting epistaxis + facial trauma 02/2009, prior R biceps + supraspinatus tears admitted overnight 2/35/57 with metabolic encephalopathy and accidental fall. Allegedly blood was found down her hallway furniture and pieces of pictures were knocked off the shelves-posterior scalp laceration was stapled in the emergency room. Found to have Enterococcal Pyelo and subsequently noted to have colonic ileus    Assessment/Plan:  1. Fall suspected due to UTI. Neuro exam is non focal. CT head: no acute findings. Scalp laceration stapled in ED -treat UTI, PT/OT eval, completed, no events on tele thus far  2. Enterococcus UTI: was treated with Rocephin, then zosyn, then augmentin  - completed course  3. Colonic Ileus: Acute on chronic: -With Significant Abd distention, noted on KUB 5/31, with large colonic stool burden -CT abdomen pelvis with contrast 6/1 with marked distention of the colon with gas, large volume stool, and fluid, presumably adynamic ileus, given fleets enema 6/2,  -case discussed with Dr.Buccini 6/2: started Nulytely 1 glass q hour and KCL, had multiple small liquids BMs -repeat KUB 6/3 with persistent Large bowel ileus and now with increased gas in small bowel as well; not really symptomatic; Appreciate Gi input; given smog enema,  -She has chronic constipation, followed by Dr. Paulita Fujita and has been on Amitiza for this in the past. -h/o colonoscopy 30years ago (1987) and refuses ever having another one in the future -6/4pm with increasing pain and distension, KUB mostly unchanged, per GI, tolerating liquids  4. Toxic metabolic encephalopathy: -due to UTI improved, but has short term memory problems    5. Early  Dementia vs Mild cognitive dysfunction; likely also affected by UTI to some extent -Dr.Samtani consulted psychiatry; for capacity as well ; poor insight and has short-term memory problems as well  -now agreeable to SNF  6.  Moderate AS -outpatient Cards eval -resumed low dose BB  7. Paroxysmal atrial fibrillation, [place] score 4-not on anticoagulation because of GI bleed, falls. Continue metoprolol 25 twice a day-currently rate controlled.  8.  Remote history of breast cancer-outpatient surveillance  DVT proph: lovenox  Code Status: Full Family Communication: no family at bedside, [place] d/w sister x2 so far, i called and left her a msg today 6/2 Disposition Plan: inpatient, SNF tomorrow or Monday  Domenic Polite, MD  [facility] Pager [phone] 02/22/2015, 2:58 PM    LOS: 5 days       Past medical history-As per Problem list Chart reviewed as below- Reviewed  Consultants:  Reviewed  Procedures:  None  Antibiotics:  IV ceftriaxone 5/28 Urine culture enterococcus   Subjective  Had enema yetserday, multiple liquid stool yesterday and this am     Objective    Objective: Filed Vitals:   02/24/15 1810 02/24/15 2124 02/25/15 0343 02/25/15 1024  BP: 146/75 155/81 147/78 125/66  Pulse: 79 96 83 84  Temp: 98 F (36.7 C) 97.7 F (36.5 C) 97.6 F (36.4 C) 97.6 F (36.4 C)  TempSrc: Oral   Oral  Resp: 18 18 17 18   Height:      Weight:  54.432 kg (120 lb)    SpO2: 97% 97% 92% 96%    Intake/Output Summary (Last 24 hours) at 02/25/15 1142 Last data filed at 02/25/15 0900  Gross per 24 hour  Intake    640 ml  Output   2200 ml  Net  -1560 ml    Exam:  Gen: AAOx3, no distress HEENT: PERRLA Stitches on the back of the head/sutures appear intact and clean with mild oozing CVS: L4T6/YBW, systolic murmur Chest CTAB Abdomen soft nontender,  less distended, more soft today BS incraesed Ext: No edema    Data Reviewed: Basic Metabolic  Panel:  Recent Labs Lab 02/19/15 0600 02/20/15 1305 02/21/15 1230 02/22/15 0518 02/23/15 0625  NA 132* 134* 132* 132* 132*  K 3.2* 2.5* 4.4 3.8 3.9  CL 96* 97* 99* 99* 100*  CO2 27 26 23 24 27   GLUCOSE 84 97 180* 83 82  BUN 14 11 11 13 16   CREATININE 0.53 0.35* 0.53 0.38* 0.42*  CALCIUM 8.1* 7.9* 8.1* 8.1* 8.0*   Liver Function Tests:  Recent Labs Lab 02/20/15 1305 02/22/15 0518  AST 23 20  ALT 27 23  ALKPHOS 99 98  BILITOT 1.3* 0.9  PROT 5.1* 4.7*  ALBUMIN 2.8* 2.5*   No results for input(s): LIPASE, AMYLASE in the last 168 hours.  Recent Labs Lab 02/20/15 1305  AMMONIA 13   CBC:  Recent Labs Lab 02/21/15 1230 02/22/15 0518 02/23/15 0625  WBC 8.8 10.2 7.4  HGB 13.3 11.7* 11.3*  HCT 38.5 34.3* 33.4*  MCV 91.2 92.0 92.3  PLT 238 182 195   Cardiac Enzymes: No results for input(s): CKTOTAL, CKMB, CKMBINDEX, TROPONINI in the last 168 hours. BNP: Invalid input(s): POCBNP CBG:  Recent Labs Lab 02/20/15 1132  GLUCAP 100*    Recent Results (from the past 240 hour(s))  Urine culture     Status: None   Collection Time: 02/17/15  3:08 PM  Result Value Ref Range Status   Specimen Description URINE, RANDOM  Final   Special Requests NONE  Final   Colony Count   Final    >=100,000 COLONIES/ML Performed at [facility]    Culture   Final    ENTEROCOCCUS SPECIES Performed at [facility]    Report Status 02/19/2015 FINAL  Final   Organism ID, Bacteria ENTEROCOCCUS SPECIES  Final      Susceptibility   Enterococcus species - MIC*    AMPICILLIN <=2 SENSITIVE Sensitive     LEVOFLOXACIN 1 SENSITIVE Sensitive     NITROFURANTOIN <=16 SENSITIVE Sensitive     VANCOMYCIN 1 SENSITIVE Sensitive     TETRACYCLINE >=16 RESISTANT Resistant     * ENTEROCOCCUS SPECIES  Culture, blood (routine x 2)     Status: None   Collection Time: 02/17/15  6:21 PM  Result Value Ref Range Status   Specimen Description BLOOD BLOOD RIGHT FOREARM  Final   Special  Requests BOTTLES DRAWN AEROBIC AND ANAEROBIC 4 CC  Final   Culture   Final    STAPHYLOCOCCUS SPECIES (COAGULASE NEGATIVE) Note: THE SIGNIFICANCE OF ISOLATING THIS ORGANISM FROM A SINGLE SET OF BLOOD CULTURES WHEN MULTIPLE SETS ARE DRAWN IS UNCERTAIN. PLEASE NOTIFY THE MICROBIOLOGY DEPARTMENT WITHIN ONE WEEK IF SPECIATION AND SENSITIVITIES ARE REQUIRED. Note: Gram Stain Report Called to,Read Back By and Verified With: REBECCA SPARKS 5.29.16  22PM  [place] Performed at [facility]    Report Status 02/19/2015 FINAL  Final  Culture, blood (routine x 2)     Status: None   Collection Time: 02/17/15  6:28 PM  Result Value Ref Range Status   Specimen Description BLOOD RIGHT WRIST  Final   Special Requests BOTTLES DRAWN AEROBIC AND  ANAEROBIC 3 CC  Final   Culture   Final    NO GROWTH 5 DAYS Performed at [facility]    Report Status 02/24/2015 FINAL  Final     Studies:              All Imaging reviewed and is as per above notation   Scheduled Meds: . cholecalciferol  5,000 Units Oral Once per day on Mon Wed Fri  . cycloSPORINE  1 drop Both Eyes BID  . enoxaparin (LOVENOX) injection  30 mg Subcutaneous Q24H  . feeding supplement (RESOURCE BREEZE)  1 Container Oral TID BM  . fentaNYL (SUBLIMAZE) injection  25 mcg Intravenous Once  . lubiprostone  24 mcg Oral BID WC  . magnesium oxide  800 mg Oral Daily  . metoprolol tartrate  25 mg Oral BID  . pantoprazole  40 mg Oral Daily  . polyethylene glycol  17 g Oral BID  . sodium chloride  3 mL Intravenous Q12H  . thyroid  60 mg Oral QAC breakfast   Continuous Infusions:

## 2015-02-26 ENCOUNTER — Inpatient Hospital Stay (HOSPITAL_COMMUNITY): Payer: Medicare Other

## 2015-02-26 LAB — CBC
HCT: 33.4 % — ABNORMAL LOW (ref 36.0–46.0)
HEMOGLOBIN: 11.5 g/dL — AB (ref 12.0–15.0)
MCH: 32 pg (ref 26.0–34.0)
MCHC: 34.4 g/dL (ref 30.0–36.0)
MCV: 93 fL (ref 78.0–100.0)
Platelets: 199 10*3/uL (ref 150–400)
RBC: 3.59 MIL/uL — ABNORMAL LOW (ref 3.87–5.11)
RDW: 15.5 % (ref 11.5–15.5)
WBC: 6.6 10*3/uL (ref 4.0–10.5)

## 2015-02-26 LAB — BASIC METABOLIC PANEL
ANION GAP: 8 (ref 5–15)
BUN: 8 mg/dL (ref 6–20)
CO2: 30 mmol/L (ref 22–32)
CREATININE: 0.42 mg/dL — AB (ref 0.44–1.00)
Calcium: 8.2 mg/dL — ABNORMAL LOW (ref 8.9–10.3)
Chloride: 98 mmol/L — ABNORMAL LOW (ref 101–111)
GFR calc non Af Amer: >60 mL/min (ref >60)
GLUCOSE: 92 mg/dL (ref 65–99)
POTASSIUM: 2.9 mmol/L — AB (ref 3.5–5.1)
Sodium: 136 mmol/L (ref 135–145)

## 2015-02-26 MED ORDER — POTASSIUM CHLORIDE 20 MEQ/15ML (10%) PO SOLN
60.0000 meq | ORAL | Status: DC
  Administered 2015-02-26: 60 meq via ORAL
  Filled 2015-02-26 (×2): qty 45

## 2015-02-26 MED ORDER — POTASSIUM CHLORIDE CRYS ER 20 MEQ PO TBCR
60.0000 meq | EXTENDED_RELEASE_TABLET | ORAL | Status: AC
  Administered 2015-02-26 (×2): 60 meq via ORAL
  Filled 2015-02-26 (×2): qty 3

## 2015-02-26 MED ORDER — HYDROCORTISONE 2.5 % RE CREA
TOPICAL_CREAM | Freq: Four times a day (QID) | RECTAL | Status: DC
  Administered 2015-02-26 – 2015-03-02 (×15): via RECTAL
  Filled 2015-02-26: qty 28.35

## 2015-02-26 NOTE — Progress Notes (Signed)
[name] Progress Note  Subjective: The patient states that she feels much better today. Her abdomen feels better. There has been improvement in her x-ray and the colonic ileus has improved.  Objective: Vital signs in last 24 hours: Temp:  [97.7 F (36.5 C)-98 F (36.7 C)] 97.8 F (36.6 C) 2023-03-11 0500) Pulse Rate:  [74-80] 74 2023/03/11 0500) Resp:  [18] 18 11-Mar-2023 0500) BP: (128-149)/(70-77) 149/73 mmHg 2023-03-11 0500) SpO2:  [96 %-100 %] 100 % 03/11/23 0500) Weight:  [53.797 kg (118 lb 9.6 oz)] 53.797 kg (118 lb 9.6 oz) (06/05 2053) Weight change: -0.635 kg (-1 lb 6.4 oz)   PE:  Heart regular rhythm Aragon lungs clear  Abdomen: Bowel sounds present, soft, not tender  Lab Results: Results for orders placed or performed during the hospital encounter of 02/17/15 (from the past 24 hour(s))  CBC     Status: Abnormal   Collection Time: 03-11-2015  4:42 AM  Result Value Ref Range   WBC 6.6 4.0 - 10.5 K/uL   RBC 3.59 (L) 3.87 - 5.11 MIL/uL   Hemoglobin 11.5 (L) 12.0 - 15.0 g/dL   HCT 33.4 (L) 36.0 - 46.0 %   MCV 93.0 78.0 - 100.0 fL   MCH 32.0 26.0 - 34.0 pg   MCHC 34.4 30.0 - 36.0 g/dL   RDW 15.5 11.5 - 15.5 %   Platelets 199 150 - 400 K/uL  Basic metabolic panel     Status: Abnormal   Collection Time: 03/11/2015  4:42 AM  Result Value Ref Range   Sodium 136 135 - 145 mmol/L   Potassium 2.9 (L) 3.5 - 5.1 mmol/L   Chloride 98 (L) 101 - 111 mmol/L   CO2 30 22 - 32 mmol/L   Glucose, Bld 92 65 - 99 mg/dL   BUN 8 6 - 20 mg/dL   Creatinine, Ser 0.42 (L) 0.44 - 1.00 mg/dL   Calcium 8.2 (L) 8.9 - 10.3 mg/dL   GFR calc non Af Amer >60 >60 mL/min   GFR calc Af Amer >60 >60 mL/min   Anion gap 8 5 - 15    Studies/Results: Dg Abd 2 Views  03-11-15   CLINICAL DATA:  81 year old female with a history of abdominal distention.  EXAM: ABDOMEN - 2 VIEW  COMPARISON:  CT 02/21/2015.  Plain film 02/24/2015, 02/25/2015.  FINDINGS: Compared to prior plain film, there is decreased volume  of colonic gas on the current study, with mild distension of both small bowel and colon.  No evidence of free air.  There are a few air-fluid levels persisting on the decubitus image.  Dense calcifications of the vasculature.  IMPRESSION: Improving colonic distention, suggesting improvement in the ileus/obstruction. There is, however, persisting gas throughout the GI system with a few air-fluid levels, and surveillance with plain film may be useful to assure continued improvement/ resolution.  These results were called by telephone at the time of interpretation on March 11, 2015 at 9:10 am to Dr. Arta Silence , who verbally acknowledged these results.  Atherosclerosis.  Signed,  Dulcy Fanny. Earleen Newport, DO  [facility]  [facility]   Electronically Signed   By: Corrie Mckusick D.O.   On: 11-Mar-2015 09:10      Assessment: Colonic ileus  Plan:   Continue current therapy. There has been improvement.    SAM F GALEM Mar 11, 2015, 10:45 AM  Pager: [phone] If no answer or after 5 PM call [phone]

## 2015-02-26 NOTE — Progress Notes (Addendum)
[name] DOB: [birth year] DOA: 02/17/2015 PCP: Cari Caraway, MD  Brief narrative: 81 y/o ? recent admission 3.28-->12/22/14 Antral gastritis 2/2 Aleve, Chr Afib [place] score 4 not on AC 2/2 GIB, Hyponatremia, remote h/o breast cancer, prior severe fall resulting epistaxis + facial trauma 02/2009, prior R biceps + supraspinatus tears admitted overnight 3/55/73 with metabolic encephalopathy and accidental fall. Allegedly blood was found down her hallway furniture and pieces of pictures were knocked off the shelves-posterior scalp laceration was stapled in the emergency room. Found to have Enterococcal Pyelo and subsequently noted to have colonic ileus  Assessment/Plan:  1. Fall suspected due to UTI. Neuro exam is non focal. CT head: no acute findings. Scalp laceration stapled in ED -treat UTI, PT/OT eval, completed, no events on tele thus far -SNF for rehab  2. Enterococcus UTI: was treated with Rocephin, then zosyn, then augmentin  - completed course  3. Colonic Ileus: Acute on chronic: -With Massive Abd distention, Colonic Ileus noted on KUB 5/31, with large colonic stool burden -CT abdomen pelvis with contrast 6/1 with marked distention of the colon with gas, large volume stool, and fluid, presumably adynamic ileus, given fleets enema 6/2,  -case discussed with Dr.Buccini 6/2: started Nulytely 1 glass q hour and KCL, had multiple small liquids BMs -repeat KUB 6/3 with persistent Large bowel ileus and now with increased gas in small bowel as well; not really symptomatic; seen by GI; given smog enema,  -She has chronic constipation, followed by Dr. Paulita Fujita and has been on Amitiza for this in the past. -h/o colonoscopy 30years ago (1987) and refuses ever having another one in the future -6/4pm with increasing pain and distension, KUB mostly unchanged, per GI, tolerating liquids, enemas given -6/5: rectal tube placed, -6/6 ileus improving with rectal tube, per GI  4. Toxic  metabolic encephalopathy: -due to UTI improved, but has short term memory problems    5. Early Dementia vs Mild cognitive dysfunction; likely also affected by UTI to some extent -Dr.Samtani consulted psychiatry; for capacity as well ; poor insight and has short-term memory problems as well  -now agreeable to SNF  6.  Moderate AS -outpatient Cards eval -resumed low dose BB  7. Paroxysmal atrial fibrillation, [place] score 4-not on anticoagulation because of GI bleed, falls. Continue metoprolol 25 twice a day-currently rate controlled.  8.  Remote history of breast cancer-outpatient surveillance  DVT proph: lovenox  Code Status: Full Family Communication: no family at bedside, [place] d/w sister x2 so far, i called and left her a msg 6/2 Disposition Plan: inpatient, SNF when stable, hopefully couple of days  Domenic Polite, MD  [facility] Pager [phone] 02/22/2015, 2:58 PM    LOS: 5 days       Past medical history-As per Problem list Chart reviewed as below- Reviewed  Consultants:  Reviewed  Procedures:  None  Antibiotics:  IV ceftriaxone 5/28 Urine culture enterococcus   Subjective  Rectal tube placed yesterday, belly soft but tube burning her anus     Objective    Objective: Filed Vitals:   02/25/15 1704 02/25/15 2053 02/26/15 0500 02/26/15 1100  BP: 130/70 128/77 149/73 123/72  Pulse: 74 80 74 71  Temp: 98 F (36.7 C) 97.7 F (36.5 C) 97.8 F (36.6 C) 98.6 F (37 C)  TempSrc: Oral Oral Oral Oral  Resp: 18 18 18 20   Height:      Weight:  53.797 kg (118 lb 9.6 oz)    SpO2: 96% 97% 100% 98%  Intake/Output Summary (Last 24 hours) at 02/26/15 1722 Last data filed at 02/26/15 1100  Gross per 24 hour  Intake    340 ml  Output    300 ml  Net     40 ml    Exam:  Gen: AAOx3, no distress HEENT: PERRLA Stitches on the back of the head/sutures appear intact and clean with mild oozing CVS: N2T5/TDD, systolic murmur Chest CTAB Abdomen  soft nontender,  Much less distended, more soft today BS incraesed Ext: No edema    Data Reviewed: Basic Metabolic Panel:  Recent Labs Lab 02/20/15 1305 02/21/15 1230 02/22/15 0518 02/23/15 0625 02/26/15 0442  NA 134* 132* 132* 132* 136  K 2.5* 4.4 3.8 3.9 2.9*  CL 97* 99* 99* 100* 98*  CO2 26 23 24 27 30   GLUCOSE 97 180* 83 82 92  BUN 11 11 13 16 8   CREATININE 0.35* 0.53 0.38* 0.42* 0.42*  CALCIUM 7.9* 8.1* 8.1* 8.0* 8.2*   Liver Function Tests:  Recent Labs Lab 02/20/15 1305 02/22/15 0518  AST 23 20  ALT 27 23  ALKPHOS 99 98  BILITOT 1.3* 0.9  PROT 5.1* 4.7*  ALBUMIN 2.8* 2.5*   No results for input(s): LIPASE, AMYLASE in the last 168 hours.  Recent Labs Lab 02/20/15 1305  AMMONIA 13   CBC:  Recent Labs Lab 02/21/15 1230 02/22/15 0518 02/23/15 0625 02/26/15 0442  WBC 8.8 10.2 7.4 6.6  HGB 13.3 11.7* 11.3* 11.5*  HCT 38.5 34.3* 33.4* 33.4*  MCV 91.2 92.0 92.3 93.0  PLT 238 182 195 199   Cardiac Enzymes: No results for input(s): CKTOTAL, CKMB, CKMBINDEX, TROPONINI in the last 168 hours. BNP: Invalid input(s): POCBNP CBG:  Recent Labs Lab 02/20/15 1132  GLUCAP 100*    Recent Results (from the past 240 hour(s))  Urine culture     Status: None   Collection Time: 02/17/15  3:08 PM  Result Value Ref Range Status   Specimen Description URINE, RANDOM  Final   Special Requests NONE  Final   Colony Count   Final    >=100,000 COLONIES/ML Performed at [facility]    Culture   Final    ENTEROCOCCUS SPECIES Performed at [facility]    Report Status 02/19/2015 FINAL  Final   Organism ID, Bacteria ENTEROCOCCUS SPECIES  Final      Susceptibility   Enterococcus species - MIC*    AMPICILLIN <=2 SENSITIVE Sensitive     LEVOFLOXACIN 1 SENSITIVE Sensitive     NITROFURANTOIN <=16 SENSITIVE Sensitive     VANCOMYCIN 1 SENSITIVE Sensitive     TETRACYCLINE >=16 RESISTANT Resistant     * ENTEROCOCCUS SPECIES  Culture, blood (routine  x 2)     Status: None   Collection Time: 02/17/15  6:21 PM  Result Value Ref Range Status   Specimen Description BLOOD BLOOD RIGHT FOREARM  Final   Special Requests BOTTLES DRAWN AEROBIC AND ANAEROBIC 4 CC  Final   Culture   Final    STAPHYLOCOCCUS SPECIES (COAGULASE NEGATIVE) Note: THE SIGNIFICANCE OF ISOLATING THIS ORGANISM FROM A SINGLE SET OF BLOOD CULTURES WHEN MULTIPLE SETS ARE DRAWN IS UNCERTAIN. PLEASE NOTIFY THE MICROBIOLOGY DEPARTMENT WITHIN ONE WEEK IF SPECIATION AND SENSITIVITIES ARE REQUIRED. Note: Gram Stain Report Called to,Read Back By and Verified With: REBECCA SPARKS 5.29.16  40PM  [place] Performed at [facility]    Report Status 02/19/2015 FINAL  Final  Culture, blood (routine x 2)     Status:  None   Collection Time: 02/17/15  6:28 PM  Result Value Ref Range Status   Specimen Description BLOOD RIGHT WRIST  Final   Special Requests BOTTLES DRAWN AEROBIC AND ANAEROBIC 3 CC  Final   Culture   Final    NO GROWTH 5 DAYS Performed at [facility]    Report Status 02/24/2015 FINAL  Final     Studies:              All Imaging reviewed and is as per above notation   Scheduled Meds: . cholecalciferol  5,000 Units Oral Once per day on Mon Wed Fri  . cycloSPORINE  1 drop Both Eyes BID  . enoxaparin (LOVENOX) injection  30 mg Subcutaneous Q24H  . feeding supplement (RESOURCE BREEZE)  1 Container Oral TID BM  . fentaNYL (SUBLIMAZE) injection  25 mcg Intravenous Once  . hydrocortisone   Rectal QID  . lubiprostone  24 mcg Oral BID WC  . magnesium oxide  800 mg Oral Daily  . metoprolol tartrate  25 mg Oral BID  . pantoprazole  40 mg Oral Daily  . polyethylene glycol  17 g Oral BID  . sodium chloride  3 mL Intravenous Q12H  . thyroid  60 mg Oral QAC breakfast   Continuous Infusions:

## 2015-02-27 ENCOUNTER — Inpatient Hospital Stay (HOSPITAL_COMMUNITY): Payer: Medicare Other

## 2015-02-27 LAB — BASIC METABOLIC PANEL
Anion gap: 5 (ref 5–15)
BUN: 7 mg/dL (ref 6–20)
CO2: 31 mmol/L (ref 22–32)
Calcium: 8 mg/dL — ABNORMAL LOW (ref 8.9–10.3)
Chloride: 98 mmol/L — ABNORMAL LOW (ref 101–111)
Creatinine, Ser: 0.5 mg/dL (ref 0.44–1.00)
GFR calc Af Amer: >60 mL/min (ref >60)
GFR calc non Af Amer: >60 mL/min (ref >60)
GLUCOSE: 88 mg/dL (ref 65–99)
POTASSIUM: 4 mmol/L (ref 3.5–5.1)
SODIUM: 134 mmol/L — AB (ref 135–145)

## 2015-02-27 LAB — CBC
HEMATOCRIT: 30.2 % — AB (ref 36.0–46.0)
HEMOGLOBIN: 10.4 g/dL — AB (ref 12.0–15.0)
MCH: 32 pg (ref 26.0–34.0)
MCHC: 34.4 g/dL (ref 30.0–36.0)
MCV: 92.9 fL (ref 78.0–100.0)
PLATELETS: 181 10*3/uL (ref 150–400)
RBC: 3.25 MIL/uL — AB (ref 3.87–5.11)
RDW: 15.6 % — ABNORMAL HIGH (ref 11.5–15.5)
WBC: 5 10*3/uL (ref 4.0–10.5)

## 2015-02-27 MED ORDER — GERHARDT'S BUTT CREAM
TOPICAL_CREAM | Freq: Two times a day (BID) | CUTANEOUS | Status: DC
  Administered 2015-02-27 – 2015-03-02 (×7): via TOPICAL
  Filled 2015-02-27: qty 1

## 2015-02-27 MED ORDER — BOOST / RESOURCE BREEZE PO LIQD: 1.0000 | Freq: Every day | ORAL | Status: DC

## 2015-02-27 MED ORDER — ENOXAPARIN SODIUM 40 MG/0.4ML ~~LOC~~ SOLN
40.0000 mg | SUBCUTANEOUS | Status: DC
  Administered 2015-02-27 – 2015-03-01 (×3): 40 mg via SUBCUTANEOUS
  Filled 2015-02-27 (×4): qty 0.4

## 2015-02-27 MED ORDER — ENSURE ENLIVE PO LIQD
237.0000 mL | Freq: Two times a day (BID) | ORAL | Status: DC
  Administered 2015-02-27 – 2015-02-28 (×2): 237 mL via ORAL

## 2015-02-27 NOTE — Clinical Social Work Note (Signed)
CSW continuing to monitor patient's progress. Update given to Deirdre Pippins, Admissions Director - [facility]. CSW will assist with d/c to facility when medically stable.   Cassandra Harbold Givens, MSW, LCSW Licensed Clinical Social Worker [facility] [phone]

## 2015-02-27 NOTE — Progress Notes (Signed)
Physical Therapy Treatment Patient Details Name: [name] MRN: [id number] DOB: 1927/04/02 Today's Date: 02/27/2015    History of Present Illness [name] is a 81 y.o. female with PMH of HTN, Hypothyroidism, Aortic stenosis, PAF (not on anticoagulation due to GIB) presented with unwitnessed fall, laceration to back of head. Patient lives alone, could not remember the fall. Per EMS: patient had blood down the hallway and furniture/pictures knocked off their shelves.    PT Comments    Pt was seen for assessment of her tolerance for gait and balance, with an increased control for standing balance after having some practice with RW.  Her plan continues to be SNF as she is weak and unable to stand without mod assist.  Will continue to work on gait and balance until then.  Follow Up Recommendations  SNF     Equipment Recommendations  Rolling walker with 5" wheels    Recommendations for Other Services OT consult     Precautions / Restrictions Precautions Precautions: Fall Restrictions Weight Bearing Restrictions: No    Mobility  Bed Mobility Overal bed mobility: Needs Assistance Bed Mobility: Supine to Sit     Supine to sit: Min assist        Transfers Overall transfer level: Needs assistance Equipment used: Rolling walker (2 wheeled) Transfers: Sit to/from [facility] Sit to Stand: Min assist Stand pivot transfers: Min assist       General transfer comment: increasing her control with walker with specific cues for posture, direction of walker and controlling LE's  Ambulation/Gait Ambulation/Gait assistance: Min assist;Mod assist Ambulation Distance (Feet): 20 Feet Assistive device: Rolling walker (2 wheeled) Gait Pattern/deviations: Step-through pattern;Decreased step length - left;Decreased step length - right;Trunk flexed;Wide base of support;Shuffle Gait velocity: slowed Gait velocity interpretation: Below normal speed for  age/gender General Gait Details: assisted to steer and verbally sequenced   Stairs            Wheelchair Mobility    Modified Rankin (Stroke Patients Only)       Balance Overall balance assessment: Needs assistance Sitting-balance support: Feet supported Sitting balance-Leahy Scale: Fair     Standing balance support: Bilateral upper extremity supported Standing balance-Leahy Scale: Poor                      Cognition Arousal/Alertness: Awake/alert Behavior During Therapy: Impulsive;Restless Overall Cognitive Status: Impaired/Different from baseline Area of Impairment: Safety/judgement;Awareness;Problem solving     Memory: Decreased recall of precautions;Decreased short-term memory   Safety/Judgement: Decreased awareness of safety;Decreased awareness of deficits Awareness: Intellectual Problem Solving: Slow processing;Requires verbal cues General Comments: takling about her "lost glasses"    Exercises      General Comments General comments (skin integrity, edema, etc.): Pt has been given a flexiseal and is leaking which nursing aware after observing pt stand with PT      Pertinent Vitals/Pain Pain Assessment: No/denies pain    Home Living                      Prior Function            PT Goals (current goals can now be found in the care plan section) Acute Rehab PT Goals Patient Stated Goal: to walk in her room Progress towards PT goals: Progressing toward goals    Frequency  Min 3X/week    PT Plan Current plan remains appropriate    Co-evaluation  End of Session Equipment Utilized During Treatment: Gait belt Activity Tolerance: Patient tolerated treatment well Patient left: in chair;with call bell/phone within reach;with chair alarm set;with nursing/sitter in room     Time: 5686-1683 PT Time Calculation (min) (ACUTE ONLY): 21 min  Charges:  $Gait Training: 8-22 mins $Therapeutic Activity: 8-22 mins                     G Codes:      Ramond Dial 03-05-2015, 4:36 PM  Mee Hives, PT MS Acute Rehab Dept. Number: [facility] [phone] and [place] [phone]

## 2015-02-27 NOTE — Progress Notes (Signed)
Nutrition Follow-up  DOCUMENTATION CODES:  Severe malnutrition in context of chronic illness, Underweight  INTERVENTION:  Ensure Enlive (each supplement provides 350kcal and 20 grams of protein), Boost Breeze   Encourage adequate PO intake.   NUTRITION DIAGNOSIS:  Malnutrition related to chronic illness as evidenced by percent weight loss, severe depletion of body fat, severe depletion of muscle mass; ongoing  GOAL:  Patient will meet greater than or equal to 90% of their needs; not met  MONITOR:  PO intake, Supplement acceptance, Weight trends, Labs, I & O's  REASON FOR ASSESSMENT:   (Low BMI)    ASSESSMENT: Pt with PMH of HTN, Hypothyroidism, Aortic stenosis, PAF (not on anticoagulation due to GIB) presented with unwitnessed fall, laceration to back of head. Pt with colonic ileus.  Pt reports her appetite is fine. Meal completion has been varied from 0-50%. Abdomen more distended to day. Pt is currently on a full liquid diet and has been consuming her Resource Breeze supplements. Pt is agreeable to trying Ensure to aid in caloric and protein needs. RD to order. Pt was encouraged to consume her food at meals and to drink her supplements.   Labs and medications reviewed.   Height:  Ht Readings from Last 1 Encounters:  02/17/15 _0  (1.702 m)    Weight:  Wt Readings from Last 1 Encounters:  02/26/15 121 lb 1.6 oz (54.931 kg)    Ideal Body Weight:  61 kg  Wt Readings from Last 10 Encounters:  02/26/15 121 lb 1.6 oz (54.931 kg)  12/22/14 116 lb 6.5 oz (52.8 kg)  08/31/14 125 lb (56.7 kg)  08/02/14 125 lb (56.7 kg)  06/01/14 125 lb (56.7 kg)  05/15/14 125 lb (56.7 kg)  03/02/14 125 lb (56.7 kg)  11/23/13 125 lb (56.7 kg)  03/09/13 128 lb (58.06 kg)  11/02/12 128 lb (58.06 kg)    BMI:  Body mass index is 18.96 kg/(m^2).  Estimated Nutritional Needs:  Kcal:  1600-1800  Protein:  70-85 grams   Fluid:  1.6 - 1.8 L/day  Skin:  Wound (see comment)  (Laceration on head)  Diet Order:  Diet - low sodium heart healthy Diet full liquid Room service appropriate?: Yes; Fluid consistency:: Thin  EDUCATION NEEDS:  No education needs identified at this time   Intake/Output Summary (Last 24 hours) at 02/27/15 1429 Last data filed at 02/27/15 1349  Gross per 24 hour  Intake   1080 ml  Output      0 ml  Net   1080 ml    Last BM:  6/6  Kallie Locks, MS, RD, LDN Pager # [phone] After hours/ weekend pager # [phone]

## 2015-02-27 NOTE — Progress Notes (Signed)
Feels a little more distended today than yesterday.  PE: Abdomen with bowel sounds, soft and non tender. Abdomen xray still shows ileus of colon  Imp: Colonic ileus  Plan: Continue supportive care. Keep electrolyte normal. Mobilize. Avoid narcotics. Not much more to offer. Call if needed.  Estell Harpin MD

## 2015-02-27 NOTE — Progress Notes (Signed)
[name] EXB:[id number] DOB: 16-Oct-1926 DOA: 02/17/2015 PCP: Cari Caraway, MD  Brief narrative: 53 /F Chr Afib not on AC 2/2 GIB HTN, Hypothyroidism, Aortic stenosis, presented with unwitnessed fall, laceration to back of head. Patient lives alone, could not remember the fall. Per EMS: patient had blood down the hallway and furniture/pictures knocked off their shelves  Then found to have Enterococcal UTI , completed Rx and subsequently noted to have Severe colonic ileus Acute on chronic colonic ileus has been very difficult to treat, GI following, tried numerous enemas, and then Nulytely and finally starting to improve with rectal tube  Assessment/Plan:  1. Fall suspected due to UTI. Neuro exam is non focal. CT head: no acute findings. Scalp laceration stapled in ED -treat UTI, PT/OT eval, completed, no events on tele thus far -SNF for rehab  2. Enterococcus UTI: was treated with Rocephin, then zosyn, then augmentin  - completed course  3. Colonic Ileus: Acute on chronic: -With Massive Abd distention, Colonic Ileus noted on KUB 5/31, with large colonic stool burden -CT abdomen pelvis with contrast 6/1 with marked distention of the colon with gas, large volume stool, and fluid, presumably adynamic ileus, given fleets enema 6/2,  -case discussed with Dr.Buccini 6/2: started Nulytely 1 glass q hour and KCL, had multiple small liquids BMs -repeat KUB 6/3 with persistent Large bowel ileus and now with increased gas in small bowel as well; not really symptomatic; seen by GI; given smog enema multiple times -She has chronic constipation, followed by Dr. Paulita Fujita and has been on Amitiza for this in the past. -h/o colonoscopy 30years ago (1987) and refuses ever having another one in the future -6/4pm with increasing pain and distension, KUB mostly unchanged, per GI, tolerating liquids, enemas given -6/5: rectal tube placed, -6/6 ileus improving with rectal tube, per GI -6/7 KUB unchanged  from 6/6, tolerating full liquids  4. Toxic metabolic encephalopathy: -due to UTI improved, but has short term memory problems    5. Early Dementia vs Mild cognitive dysfunction; likely also affected by UTI to some extent -Dr.Samtani consulted psychiatry; for capacity as well ; poor insight and has short-term memory problems as well  -now agreeable to SNF  6.  Moderate AS -outpatient Cards eval -resumed low dose BB  7. Paroxysmal atrial fibrillation,  -Mali score 4-not on anticoagulation because of GI bleed, falls. Continue metoprolol 25 twice a day-currently rate controlled.  8.  Remote history of breast cancer -outpatient surveillance  DVT proph: lovenox  Code Status: Full Family Communication: no family at bedside, Dr.Samtani d/w sister x2 so far, i called and left her a msg 6/2, updated sister via telephone 6/7 Disposition Plan: inpatient, SNF when stable, hopefully couple of days  Domenic Polite, MD  [facility] Pager [phone] 02/22/2015, 2:58 PM    LOS: 5 days       Past medical history-As per Problem list Chart reviewed as below- Reviewed  Consultants:  Reviewed  Procedures:  None  Antibiotics:  IV ceftriaxone 5/28 Urine culture enterococcus   Subjective  Feels good, no N/V, no abd pain, tolerating liquids     Objective    Objective: Filed Vitals:   02/26/15 1814 02/26/15 2040 02/27/15 0551 02/27/15 0803  BP: 104/62 112/89 119/73 142/71  Pulse: 71 77 69 85  Temp: 98.2 F (36.8 C) 98.4 F (36.9 C) 98.2 F (36.8 C) 97.8 F (36.6 C)  TempSrc: Oral Oral Oral Oral  Resp: 18 17 18 17   Height:  Weight:  54.931 kg (121 lb 1.6 oz)    SpO2: 94% 96% 97% 97%    Intake/Output Summary (Last 24 hours) at 02/27/15 1531 Last data filed at 02/27/15 1349  Gross per 24 hour  Intake    960 ml  Output      0 ml  Net    960 ml    Exam:  Gen: AAOx2, no distress HEENT: PERRLA Stitches on the back of the head/sutures appear intact and  clean with mild oozing CVS: Z7Q7/HAL, systolic murmur Chest CTAB Abdomen soft nontender,  less distended-unchanged from yetserday, more soft today BS incraesed Ext: No edema    Data Reviewed: Basic Metabolic Panel:  Recent Labs Lab 02/21/15 1230 02/22/15 0518 02/23/15 0625 02/26/15 0442 02/27/15 0550  NA 132* 132* 132* 136 134*  K 4.4 3.8 3.9 2.9* 4.0  CL 99* 99* 100* 98* 98*  CO2 23 24 27 30 31   GLUCOSE 180* 83 82 92 88  BUN 11 13 16 8 7   CREATININE 0.53 0.38* 0.42* 0.42* 0.50  CALCIUM 8.1* 8.1* 8.0* 8.2* 8.0*   Liver Function Tests:  Recent Labs Lab 02/22/15 0518  AST 20  ALT 23  ALKPHOS 98  BILITOT 0.9  PROT 4.7*  ALBUMIN 2.5*   No results for input(s): LIPASE, AMYLASE in the last 168 hours. No results for input(s): AMMONIA in the last 168 hours. CBC:  Recent Labs Lab 02/21/15 1230 02/22/15 0518 02/23/15 0625 02/26/15 0442 02/27/15 0550  WBC 8.8 10.2 7.4 6.6 5.0  HGB 13.3 11.7* 11.3* 11.5* 10.4*  HCT 38.5 34.3* 33.4* 33.4* 30.2*  MCV 91.2 92.0 92.3 93.0 92.9  PLT 238 182 195 199 181   Cardiac Enzymes: No results for input(s): CKTOTAL, CKMB, CKMBINDEX, TROPONINI in the last 168 hours. BNP: Invalid input(s): POCBNP CBG: No results for input(s): GLUCAP in the last 168 hours.  Recent Results (from the past 240 hour(s))  Culture, blood (routine x 2)     Status: None   Collection Time: 02/17/15  6:21 PM  Result Value Ref Range Status   Specimen Description BLOOD BLOOD RIGHT FOREARM  Final   Special Requests BOTTLES DRAWN AEROBIC AND ANAEROBIC 4 CC  Final   Culture   Final    STAPHYLOCOCCUS SPECIES (COAGULASE NEGATIVE) Note: THE SIGNIFICANCE OF ISOLATING THIS ORGANISM FROM A SINGLE SET OF BLOOD CULTURES WHEN MULTIPLE SETS ARE DRAWN IS UNCERTAIN. PLEASE NOTIFY THE MICROBIOLOGY DEPARTMENT WITHIN ONE WEEK IF SPECIATION AND SENSITIVITIES ARE REQUIRED. Note: Gram Stain Report Called to,Read Back By and Verified With: REBECCA SPARKS 5.29.16  78PM   [place] Performed at [facility]    Report Status 02/19/2015 FINAL  Final  Culture, blood (routine x 2)     Status: None   Collection Time: 02/17/15  6:28 PM  Result Value Ref Range Status   Specimen Description BLOOD RIGHT WRIST  Final   Special Requests BOTTLES DRAWN AEROBIC AND ANAEROBIC 3 CC  Final   Culture   Final    NO GROWTH 5 DAYS Performed at [facility]    Report Status 02/24/2015 FINAL  Final     Studies:              All Imaging reviewed and is as per above notation   Scheduled Meds: . cholecalciferol  5,000 Units Oral Once per day on Mon Wed Fri  . cycloSPORINE  1 drop Both Eyes BID  . enoxaparin (LOVENOX) injection  40 mg Subcutaneous Q24H  . feeding  supplement (ENSURE ENLIVE)  237 mL Oral BID BM  . feeding supplement (RESOURCE BREEZE)  1 Container Oral Q1500  . fentaNYL (SUBLIMAZE) injection  25 mcg Intravenous Once  . Gerhardt's butt cream   Topical BID  . hydrocortisone   Rectal QID  . lubiprostone  24 mcg Oral BID WC  . magnesium oxide  800 mg Oral Daily  . metoprolol tartrate  25 mg Oral BID  . pantoprazole  40 mg Oral Daily  . polyethylene glycol  17 g Oral BID  . sodium chloride  3 mL Intravenous Q12H  . thyroid  60 mg Oral QAC breakfast   Continuous Infusions:

## 2015-02-27 NOTE — Consult Note (Signed)
WOC wound consult note Reason for Consult: MASD, and after discussion with bedside nurse also has wounds on her back from her fall. Pt reports fall at home with head injury and that she was there for six hours. She was found down by EMS.  She reports rocking back and forth on her back to attempt to get up.  Wound type: 3 areas of partial thickness abrasions from fall left shoulder, and mid back  MASD over the bilateral buttocks and perineal area, intense redness but does not appear to be fungal. She has fecal management system in place now.  Pressure Ulcer POA No Wound bed: areas on the left shoulder and back are partial thickness, pink, and moist. Clean Drainage (amount, consistency, odor) minimal, non purulent, serosanguinous  Periwound: intact  Dressing procedure/placement/frequency: Continue soft silicone foam to protect the areas on the shoulder and back.  Fecal management system in place which should improve her MASD, however I have added Gerhardt's butt cream BID. Turn and reposition frequently  Discussed POC with patient and bedside nurse.  Re consult if needed, will not follow at this time. Thanks  [name], [place] [phone])

## 2015-02-28 ENCOUNTER — Encounter: Payer: Self-pay | Admitting: *Deleted

## 2015-02-28 ENCOUNTER — Inpatient Hospital Stay (HOSPITAL_COMMUNITY): Payer: Medicare Other

## 2015-02-28 DIAGNOSIS — F05 Delirium due to known physiological condition: Secondary | ICD-10-CM

## 2015-02-28 DIAGNOSIS — K567 Ileus, unspecified: Secondary | ICD-10-CM

## 2015-02-28 DIAGNOSIS — E43 Unspecified severe protein-calorie malnutrition: Secondary | ICD-10-CM

## 2015-02-28 DIAGNOSIS — R627 Adult failure to thrive: Secondary | ICD-10-CM

## 2015-02-28 LAB — BASIC METABOLIC PANEL
Anion gap: 6 (ref 5–15)
BUN: 6 mg/dL (ref 6–20)
CO2: 29 mmol/L (ref 22–32)
CREATININE: 0.33 mg/dL — AB (ref 0.44–1.00)
Calcium: 7.7 mg/dL — ABNORMAL LOW (ref 8.9–10.3)
Chloride: 95 mmol/L — ABNORMAL LOW (ref 101–111)
GFR calc non Af Amer: >60 mL/min (ref >60)
Glucose, Bld: 83 mg/dL (ref 65–99)
Potassium: 3.7 mmol/L (ref 3.5–5.1)
Sodium: 130 mmol/L — ABNORMAL LOW (ref 135–145)

## 2015-02-28 LAB — CBC
HCT: 31.7 % — ABNORMAL LOW (ref 36.0–46.0)
Hemoglobin: 10.9 g/dL — ABNORMAL LOW (ref 12.0–15.0)
MCH: 32 pg (ref 26.0–34.0)
MCHC: 34.4 g/dL (ref 30.0–36.0)
MCV: 93 fL (ref 78.0–100.0)
PLATELETS: 179 10*3/uL (ref 150–400)
RBC: 3.41 MIL/uL — ABNORMAL LOW (ref 3.87–5.11)
RDW: 15.4 % (ref 11.5–15.5)
WBC: 3.6 10*3/uL — ABNORMAL LOW (ref 4.0–10.5)

## 2015-02-28 MED ORDER — BISACODYL 10 MG RE SUPP
10.0000 mg | Freq: Once | RECTAL | Status: AC
  Administered 2015-02-28: 10 mg via RECTAL
  Filled 2015-02-28: qty 1

## 2015-02-28 NOTE — Consult Note (Signed)
   [facility] CM Inpatient Consult   02/28/2015  [name] 1927-05-26 [id number]   Made aware of patient's hospital admission by telephonic [facility] RNCM. Spoke with patient at bedside to discuss that [facility] received referral from her Primary Care MD. Patient confirms her Primary Care MD is Dr. Theadore Nan. [name] also endorses she is going to SNF at discharge. Made her aware that a referral will be made for [facility] Licensed CSW to follow up with her at SNF. Patient reports she does not have family locally and limited support. Consent obtained.   [name], MSN-Ed, RN,BSN [facility] Liaison [phone]

## 2015-02-28 NOTE — Progress Notes (Signed)
[name] [id number] DOB: Dec 06, 1926 DOA: 02/17/2015 PCP: Erika Caraway, MD  Brief narrative: 45 /F Chr Afib not on AC 2/2 GIB HTN, Hypothyroidism, Aortic stenosis, presented with unwitnessed fall, laceration to back of head. Patient lives alone, could not remember the fall. Per EMS: patient had blood down the hallway and furniture/pictures knocked off their shelves  Then found to have Enterococcal UTI , completed Rx and subsequently noted to have Severe colonic ileus Acute on chronic colonic ileus has been very difficult to treat, GI following, tried numerous enemas, and then Nulytely and finally starting to improve with rectal tube  Assessment/Plan:  1. Fall suspected due to UTI. Neuro exam is non focal. CT head: no acute findings. Scalp laceration stapled in ED -treat UTI, PT/OT eval, completed, no events on tele thus far -SNF for rehab  2. Enterococcus UTI: was treated with Rocephin, then zosyn, then augmentin  - completed course  3. Colonic Ileus: Acute on chronic: -With Massive Abd distention, Colonic Ileus noted on KUB 5/31, with large colonic stool burden -CT abdomen pelvis with contrast 6/1 with marked distention of the colon with gas, large volume stool, and fluid, presumably adynamic ileus, given fleets enema 6/2,  -case discussed with [name] 6/2: started Nulytely 1 glass q hour and KCL, had multiple small liquids BMs -repeat KUB 6/3 with persistent Large bowel ileus and now with increased gas in small bowel as well; not really symptomatic; seen by GI; given smog enema multiple times -She has chronic constipation, followed by [name] and has been on Amitiza for this in the past. -h/o colonoscopy 30years ago (1987) and refuses ever having another one in the future -6/4pm with increasing pain and distension, KUB mostly unchanged, per GI, tolerating liquids, enemas given -6/5: rectal tube placed, -6/6 ileus improving with rectal tube, per GI -6/7 KUB unchanged  from 6/6, tolerating full liquids -6/8 denies ab pain, no n/v, visible loose stool from rectal tube, pseudodiarrhea? Will check c diff. Appreciate GI continue to follow.  4. Toxic metabolic encephalopathy/dilirium: -due to UTI improved, but has short term memory problems   -6/8, states correct year, place but states she is 36 and she is not in the bed while she is in the bed. Agitated at times,  not coherent, psych consulted for medication assistance.  5. Early Dementia vs Mild cognitive dysfunction; likely also affected by UTI to some extent -[name] consulted psychiatry; for capacity as well ; poor insight and has short-term memory problems as well  -now agreeable to SNF  6.  Moderate AS, + murmur, known to patient, denies chest pain, patient does has falls, not able to obtain detailed history due to altered mental status, due to advanced age, poor baseline function, may not be a good candidate for aggressive intervention, outpatient cards to eval  -outpatient Cards eval -resumed low dose BB  7. Paroxysmal atrial fibrillation,  -[name] score 4-not on anticoagulation because of GI bleed, falls. Continue metoprolol 25 twice a day-currently rate controlled.  8.  Remote history of breast cancer -outpatient surveillance  DVT proph: lovenox  Code Status: Full Family Communication: no family at bedside, [name] d/w sister x2 so far, i called and left her a msg 6/2, updated sister via telephone 6/7 Disposition Plan: inpatient, SNF when stable, hopefully couple of days  Erika Reasons, MD PhD [facility] Pager [phone]     LOS: 5 days       Past medical history-As per Problem list Chart reviewed as below- Reviewed  Consultants:  GI  psych  Procedures:  None  Antibiotics: Finished abx treatment with ceftriaxone , augmentin, zosyn Urine culture enterococcus   Subjective  Confused, agitated at time, no N/V, no abd pain, tolerating liquids, loose watery stool in  rectal tube.     Objective    Objective: Filed Vitals:   02/27/15 1624 02/27/15 2123 02/28/15 0504 02/28/15 0755  BP: 103/60 117/60 154/77 154/80  Pulse: 60 70 72 77  Temp: 98 F (36.7 C) 97.9 F (36.6 C) 97.7 F (36.5 C) 97.7 F (36.5 C)  TempSrc: Oral   Oral  Resp: 17 18 18 17   Height:      Weight:  52.118 kg (114 lb 14.4 oz)    SpO2: 96% 98% 95% 95%    Intake/Output Summary (Last 24 hours) at 02/28/15 1602 Last data filed at 02/28/15 1423  Gross per 24 hour  Intake   1080 ml  Output      0 ml  Net   1080 ml    Exam:  Gen: AAOx2, intermittent confusion, agitation, frail HEENT: PERRLA Stitches on the back of the head/sutures appear intact and clean with mild oozing CVS: T7R1/HAF, systolic murmur Chest CTAB Abdomen soft nontender,  Mild distention, more soft ,+ BS  Ext: No edema    Data Reviewed: Basic Metabolic Panel:  Recent Labs Lab 02/22/15 0518 02/23/15 0625 02/26/15 0442 02/27/15 0550 02/28/15 0522  NA 132* 132* 136 134* 130*  K 3.8 3.9 2.9* 4.0 3.7  CL 99* 100* 98* 98* 95*  CO2 24 27 30 31 29   GLUCOSE 83 82 92 88 83  BUN 13 16 8 7 6   CREATININE 0.38* 0.42* 0.42* 0.50 0.33*  CALCIUM 8.1* 8.0* 8.2* 8.0* 7.7*   Liver Function Tests:  Recent Labs Lab 02/22/15 0518  AST 20  ALT 23  ALKPHOS 98  BILITOT 0.9  PROT 4.7*  ALBUMIN 2.5*   No results for input(s): LIPASE, AMYLASE in the last 168 hours. No results for input(s): AMMONIA in the last 168 hours. CBC:  Recent Labs Lab 02/22/15 0518 02/23/15 0625 02/26/15 0442 02/27/15 0550 02/28/15 0522  WBC 10.2 7.4 6.6 5.0 3.6*  HGB 11.7* 11.3* 11.5* 10.4* 10.9*  HCT 34.3* 33.4* 33.4* 30.2* 31.7*  MCV 92.0 92.3 93.0 92.9 93.0  PLT 182 195 199 181 179   Cardiac Enzymes: No results for input(s): CKTOTAL, CKMB, CKMBINDEX, TROPONINI in the last 168 hours. BNP: Invalid input(s): POCBNP CBG: No results for input(s): GLUCAP in the last 168 hours.  No results found for this or any  previous visit (from the past 240 hour(s)).   Studies:              All Imaging reviewed and is as per above notation   Scheduled Meds: . bisacodyl  10 mg Rectal Once  . cholecalciferol  5,000 Units Oral Once per day on Mon Wed Fri  . cycloSPORINE  1 drop Both Eyes BID  . enoxaparin (LOVENOX) injection  40 mg Subcutaneous Q24H  . feeding supplement (ENSURE ENLIVE)  237 mL Oral BID BM  . feeding supplement (RESOURCE BREEZE)  1 Container Oral Q1500  . fentaNYL (SUBLIMAZE) injection  25 mcg Intravenous Once  . Gerhardt's butt cream   Topical BID  . hydrocortisone   Rectal QID  . lubiprostone  24 mcg Oral BID WC  . magnesium oxide  800 mg Oral Daily  . metoprolol tartrate  25 mg Oral BID  . pantoprazole  40 mg Oral  Daily  . polyethylene glycol  17 g Oral BID  . sodium chloride  3 mL Intravenous Q12H  . thyroid  60 mg Oral QAC breakfast   Continuous Infusions:

## 2015-02-28 NOTE — Progress Notes (Signed)
Per telephone order from Dr. Penelope Coop rectal tube removed. Pt refuses Dulcolax suppository at this time; will attempt again later.   Offered to walk pt in hallway; pt refused.

## 2015-02-28 NOTE — Patient Outreach (Signed)
[facility]) Care Management  02/28/2015  [name] [birth year] [id number]  MD referral:  Per Epic chart review patient is currently inpatient status at [facility].  Discharge plan-SNF ([facility]) when ready for discharge.  Plan: referral to hospital liaison; telephonic care coordinator signing off.   [name], RN BSN [place] Management Coordinator [facility] Care Management  [phone]

## 2015-02-28 NOTE — Progress Notes (Signed)
The patient was seen again today in regards to her problem of chronic constipation and chronic colonic ileus. She is not complaining of abdominal pain. She does state that she is not getting up and walking around because the floor doesn't have enough staff to help her. When I speak to the nurse however she said they were helping her.  Most recent x-ray today shows distal migration of gas into the more distal colon.  Physical:  She is in no distress  Abdomen, bowel sounds are present, soft, mild distention, nontender  Impression: Chronic constipation, chronic colonic ileus  Plan: Ambulate. Avoid narcotics. Keep electrolytes corrected. I will try a a Dulcolax suppository and see if this helps

## 2015-03-01 ENCOUNTER — Encounter: Payer: Self-pay | Admitting: *Deleted

## 2015-03-01 LAB — COMPREHENSIVE METABOLIC PANEL
ALT: 19 U/L (ref 14–54)
ANION GAP: 5 (ref 5–15)
AST: 22 U/L (ref 15–41)
Albumin: 2.3 g/dL — ABNORMAL LOW (ref 3.5–5.0)
Alkaline Phosphatase: 117 U/L (ref 38–126)
BILIRUBIN TOTAL: 0.7 mg/dL (ref 0.3–1.2)
BUN: 6 mg/dL (ref 6–20)
CHLORIDE: 96 mmol/L — AB (ref 101–111)
CO2: 30 mmol/L (ref 22–32)
Calcium: 7.8 mg/dL — ABNORMAL LOW (ref 8.9–10.3)
Creatinine, Ser: 0.41 mg/dL — ABNORMAL LOW (ref 0.44–1.00)
GFR calc Af Amer: >60 mL/min (ref >60)
GFR calc non Af Amer: >60 mL/min (ref >60)
Glucose, Bld: 74 mg/dL (ref 65–99)
POTASSIUM: 3.6 mmol/L (ref 3.5–5.1)
SODIUM: 131 mmol/L — AB (ref 135–145)
Total Protein: 4.6 g/dL — ABNORMAL LOW (ref 6.5–8.1)

## 2015-03-01 NOTE — Progress Notes (Signed)
[name] EXN:[id number] DOB: [birth year] DOA: 02/17/2015 PCP: Cari Caraway, MD  Brief narrative: 32 /F Chr Afib not on AC 2/2 GIB HTN, Hypothyroidism, Aortic stenosis, presented with unwitnessed fall, laceration to back of head. Patient lives alone, could not remember the fall. Per EMS: patient had blood down the hallway and furniture/pictures knocked off their shelves  Then found to have Enterococcal UTI , completed Rx and subsequently noted to have Severe colonic ileus Acute on chronic colonic ileus has been very difficult to treat, GI following, tried numerous enemas, and then Nulytely and finally starting to improve with rectal tube  Assessment/Plan:  1. Fall suspected due to UTI. Neuro exam is non focal. CT head: no acute findings. Scalp laceration stapled in ED -treat UTI, PT/OT eval, completed, no events on tele thus far -SNF for rehab  2. Enterococcus UTI: was treated with Rocephin, then zosyn, then augmentin  - completed course  3. Colonic Ileus: Acute on chronic: -With Massive Abd distention, Colonic Ileus noted on KUB 5/31, with large colonic stool burden -CT abdomen pelvis with contrast 6/1 with marked distention of the colon with gas, large volume stool, and fluid, presumably adynamic ileus, given fleets enema 6/2,  -case discussed with Dr.Buccini 6/2: started Nulytely 1 glass q hour and KCL, had multiple small liquids BMs -repeat KUB 6/3 with persistent Large bowel ileus and now with increased gas in small bowel as well; not really symptomatic; seen by GI; given smog enema multiple times -She has chronic constipation, followed by Dr. Paulita Fujita and has been on Amitiza for this in the past. -h/o colonoscopy 30years ago (1987) and refuses ever having another one in the future -6/4pm with increasing pain and distension, KUB mostly unchanged, per GI, tolerating liquids, enemas given -6/5: rectal tube placed, -6/6 ileus improving with rectal tube, per GI -6/7 KUB unchanged  from 6/6, tolerating full liquids -6/8 denies ab pain, no n/v, visible loose stool from rectal tube, pseudodiarrhea? c diff pending  4. Toxic metabolic encephalopathy/dilirium: -due to UTI improved, but has short term memory problems   -6/8, states correct year, place but states she is 89 and she is not in the bed while she is in the bed. Agitated at times,  not coherent, psych consulted for medication assistance.  5. Early Dementia vs Mild cognitive dysfunction; likely also affected by UTI to some extent -Dr.Samtani consulted psychiatry; for capacity as well ; poor insight and has short-term memory problems as well  -now agreeable to SNF  6.  Moderate AS, + murmur, known to patient, denies chest pain, patient does has falls, not able to obtain detailed history due to altered mental status, due to advanced age, poor baseline function, may not be a good candidate for aggressive intervention, outpatient cards to eval  -outpatient Cards eval -resumed low dose BB  7. Paroxysmal atrial fibrillation,  -Mali score 4-not on anticoagulation because of GI bleed, falls. Continue metoprolol 25 twice a day-currently rate controlled.  8.  Remote history of breast cancer -outpatient surveillance  DVT proph: lovenox  Code Status: Full Family Communication: no family at bedside, Dr.Samtani d/w sister x2 so far, i called and left her a msg 6/2, updated sister via telephone 6/7 Disposition Plan: SNF when stable, hopefully 6/10  Florencia Reasons, MD PhD [facility] Pager [phone]     LOS: 5 days     Past medical history-As per Problem list Chart reviewed as below- Reviewed  Consultants:  GI  psych  Procedures:  None  Antibiotics:  Finished abx treatment with ceftriaxone , augmentin, zosyn Urine culture enterococcus   Subjective  Reported feeling better this am to me, however, patient was very agitated earlier, consistent with delirium.     Objective    Objective: Filed Vitals:    02/28/15 2149 03/01/15 0558 03/01/15 1000 03/01/15 1747  BP: 118/71 142/60 157/89 148/84  Pulse: 95 70 76 74  Temp: 98.4 F (36.9 C) 97.5 F (36.4 C) 98 F (36.7 C) 98 F (36.7 C)  TempSrc:   Oral Oral  Resp: 17 18 18 18   Height:      Weight:      SpO2: 98% 100% 98% 98%    Intake/Output Summary (Last 24 hours) at 03/01/15 2100 Last data filed at 03/01/15 1419  Gross per 24 hour  Intake    480 ml  Output      0 ml  Net    480 ml    Exam:  Gen: AAOx2, intermittent confusion, intermittent agitation, frail HEENT: PERRLA Stitches on the back of the head/sutures appear intact and clean with mild oozing CVS: Q6V7/QIO, systolic murmur Chest CTAB Abdomen soft nontender,  Mild distention, more soft ,+ BS  Ext: No edema    Data Reviewed: Basic Metabolic Panel:  Recent Labs Lab 02/23/15 0625 02/26/15 0442 02/27/15 0550 02/28/15 0522 03/01/15 0502  NA 132* 136 134* 130* 131*  K 3.9 2.9* 4.0 3.7 3.6  CL 100* 98* 98* 95* 96*  CO2 27 30 31 29 30   GLUCOSE 82 92 88 83 74  BUN 16 8 7 6 6   CREATININE 0.42* 0.42* 0.50 0.33* 0.41*  CALCIUM 8.0* 8.2* 8.0* 7.7* 7.8*   Liver Function Tests:  Recent Labs Lab 03/01/15 0502  AST 22  ALT 19  ALKPHOS 117  BILITOT 0.7  PROT 4.6*  ALBUMIN 2.3*   No results for input(s): LIPASE, AMYLASE in the last 168 hours. No results for input(s): AMMONIA in the last 168 hours. CBC:  Recent Labs Lab 02/23/15 0625 02/26/15 0442 02/27/15 0550 02/28/15 0522  WBC 7.4 6.6 5.0 3.6*  HGB 11.3* 11.5* 10.4* 10.9*  HCT 33.4* 33.4* 30.2* 31.7*  MCV 92.3 93.0 92.9 93.0  PLT 195 199 181 179   Cardiac Enzymes: No results for input(s): CKTOTAL, CKMB, CKMBINDEX, TROPONINI in the last 168 hours. BNP: Invalid input(s): POCBNP CBG: No results for input(s): GLUCAP in the last 168 hours.  No results found for this or any previous visit (from the past 240 hour(s)).   Studies:              All Imaging reviewed and is as per above notation    Scheduled Meds: . cholecalciferol  5,000 Units Oral Once per day on Mon Wed Fri  . cycloSPORINE  1 drop Both Eyes BID  . enoxaparin (LOVENOX) injection  40 mg Subcutaneous Q24H  . feeding supplement (ENSURE ENLIVE)  237 mL Oral BID BM  . feeding supplement (RESOURCE BREEZE)  1 Container Oral Q1500  . fentaNYL (SUBLIMAZE) injection  25 mcg Intravenous Once  . Gerhardt's butt cream   Topical BID  . hydrocortisone   Rectal QID  . lubiprostone  24 mcg Oral BID WC  . magnesium oxide  800 mg Oral Daily  . metoprolol tartrate  25 mg Oral BID  . pantoprazole  40 mg Oral Daily  . polyethylene glycol  17 g Oral BID  . sodium chloride  3 mL Intravenous Q12H  . thyroid  60 mg Oral QAC breakfast  Continuous Infusions:

## 2015-03-01 NOTE — Progress Notes (Signed)
Psych in to see patient. Pt unable to complete eval.  Per psych pt uncooperative.

## 2015-03-01 NOTE — Care Management Note (Signed)
Case Management Note  Patient Details  Name: [name] MRN: [id number] Date of Birth: 1927-01-06  Subjective/Objective:             CM following for progression and d/c planning.       Action/Plan: Met with pt 03/01/2015 pt clear and stating that she will d/c to a SNF, concerns about making a list of personal items she will need family/friends to bring her at the SNF.   Expected Discharge Date:        03/02/2015          Expected Discharge Plan:  [place]  In-House Referral:  Clinical Social Work  Discharge planning Services  NA  Post Acute Care Choice:  NA Choice offered to:  NA  DME Arranged:    DME Agency:     HH Arranged:    HH Agency:     Status of Service:  In process, will continue to follow  Medicare Important Message Given:  Yes Date Medicare IM Given:  02/20/15 Medicare IM give by:  Jasmine Pang RN MPH, case manager [phone] Date Additional Medicare IM Given:  03/01/15 Additional Medicare Important Message give by:  Jasmine Pang RN MPH, case manager  If discussed at [place] of Stay Meetings, dates discussed:    Additional Comments:  Adron Bene, RN 03/01/2015, 11:56 AM

## 2015-03-01 NOTE — Consult Note (Signed)
[facility] Consult   Reason for Consult:  Delirium and capacity evaluation Referring Physician:  Dr. Erlinda Hong Patient Identification: [name] MRN:  [id number] Principal Diagnosis: Subacute delirium Diagnosis:   Patient Active Problem List   Diagnosis Date Noted  . Ileus [K56.7] 02/23/2015  . Subacute delirium [F05] 02/21/2015  . Protein-calorie malnutrition, severe [E43] 02/21/2015  . Altered mental state [R41.82] 02/17/2015  . UTI (lower urinary tract infection) [N39.0] 02/17/2015  . GI bleed [K92.2] 12/19/2014  . Hypothyroid [E03.9] 12/19/2014  . Atrial fibrillation with rapid ventricular response, paroxysmal [I48.91] 12/19/2014  . SVT (supraventricular tachycardia) [I47.1] 12/19/2014  . Hypokalemia [E87.6] 12/19/2014  . Altered mental status [R41.82]   . Bleeding gastrointestinal [K92.2]   . GI bleeding [K92.2] 12/18/2014  . Varicose veins of lower extremities with ulcer [I83.009, L97.909] 08/02/2014  . Varicose veins of lower extremities with other complications [W09.811] 91/47/8295  . Leg swelling [M79.89] 03/03/2012  . Weakness generalized [R53.1] 04/15/2011  . OSTEOARTHRITIS, SPINE, NOS [M47.9] 12/20/2009  . SACROILIITIS [M46.1] 06/05/2009  . CONTUSION OF MULTIPLE SITES NEC [IMO0002] 06/05/2009  . CONSTIPATION [K59.00] 05/29/2009  . VERTEBROBASILAR ARTERY SYNDROME [G45.0] 03/30/2009  . NOSE FRACTURE [S02.2XXA] 03/30/2009  . TARSAL TUNNEL SYNDROME, LEFT [G57.50] 02/12/2009  . OSTEOARTHRITIS, KNEES, BILATERAL [M17.9] 01/04/2009  . OSTEOARTHRITIS, HANDS, BILATERAL [M19.049] 12/21/2008  . ROTATOR CUFF SYNDROME, RIGHT [M75.50] 07/07/2008  . Vitamin D deficiency [E55.9] 03/09/2008  . CONTACT DERMATITIS&OTHER ECZEMA DUE TO PLANTS [L25.5] 03/09/2008  . Osteoporosis, unspecified [M81.0] 03/09/2008    Total Time spent with patient: 30 minutes  Subjective:   [name] is a 81 y.o. female patient admitted with AMS and recent unwitnessed fall.   HPI:  [name] is a 81 y.o. female seen face-to-face for psychiatric consultation and evaluation today and reviewed available medical records. I have discussed with the staff RN about patient clinically situation. Patient is confused perseverating about eating food, and poor historian. Patient was not able to provide clear anxious for most of my questions and then refused to communicate with me. Based on my evaluation observation patient does not meet criteria for capacity to make her own medical decisions and living arrangements. Patient has no family members to communicate with this evaluator. Patient knows of name, that she is in hospital but does not know rest of the questions related to orientation, concentration and poor language functions.   Past Medical History:  Past Medical History  Diagnosis Date  . Cancer   . Breast cancer, left breast   . Osteoporosis   . Thyroid disease   . Hypertension   . MVP (mitral valve prolapse)   . Collagen vascular disease     Past Surgical History  Procedure Laterality Date  . Breast lumpectomy    . Hemrrhoidectomy    . Saline implant after left mastectomy    . Esophagogastroduodenoscopy N/A 12/19/2014    Procedure: ESOPHAGOGASTRODUODENOSCOPY (EGD);  Surgeon: Teena Irani, MD;  Location: [facility];  Service: Endoscopy;  Laterality: N/A;   Family History:  Family History  Problem Relation Age of Onset  . Cancer Sister     breast    Social History:  History  Alcohol Use No     History  Drug Use No    History   Social History  . Marital Status: Single    Spouse Name: N/A  . Number of Children: N/A  . Years of Education: N/A   Occupational History  . Retired    Social History  Main Topics  . Smoking status: Never Smoker   . Smokeless tobacco: Not on file  . Alcohol Use: No  . Drug Use: No  . Sexual Activity: No   Other Topics Concern  . None   Social History Narrative   She lives alone.   Additional Social History:                           Allergies:   Allergies  Allergen Reactions  . Cephalexin Nausea And Vomiting  . Linzess [Linaclotide] Other (See Comments)    Fatigue and muscle weakness  . Ultram [Tramadol Hcl] Nausea Only  . Latex Rash    Skin rash with bandaids and tapes  . Tape Rash    Reaction to adhesive tape    Labs:  Results for orders placed or performed during the hospital encounter of 02/17/15 (from the past 48 hour(s))  Basic metabolic panel     Status: Abnormal   Collection Time: 02/28/15  5:22 AM  Result Value Ref Range   Sodium 130 (L) 135 - 145 mmol/L   Potassium 3.7 3.5 - 5.1 mmol/L   Chloride 95 (L) 101 - 111 mmol/L   CO2 29 22 - 32 mmol/L   Glucose, Bld 83 65 - 99 mg/dL   BUN 6 6 - 20 mg/dL   Creatinine, Ser 0.33 (L) 0.44 - 1.00 mg/dL   Calcium 7.7 (L) 8.9 - 10.3 mg/dL   GFR calc non Af Amer >60 >60 mL/min   GFR calc Af Amer >60 >60 mL/min    Comment: (NOTE) The eGFR has been calculated using the CKD EPI equation. This calculation has not been validated in all clinical situations. eGFR's persistently <60 mL/min signify possible Chronic Kidney Disease.    Anion gap 6 5 - 15  CBC     Status: Abnormal   Collection Time: 02/28/15  5:22 AM  Result Value Ref Range   WBC 3.6 (L) 4.0 - 10.5 K/uL   RBC 3.41 (L) 3.87 - 5.11 MIL/uL   Hemoglobin 10.9 (L) 12.0 - 15.0 g/dL   HCT 31.7 (L) 36.0 - 46.0 %   MCV 93.0 78.0 - 100.0 fL   MCH 32.0 26.0 - 34.0 pg   MCHC 34.4 30.0 - 36.0 g/dL   RDW 15.4 11.5 - 15.5 %   Platelets 179 150 - 400 K/uL  Comprehensive metabolic panel     Status: Abnormal   Collection Time: 03/01/15  5:02 AM  Result Value Ref Range   Sodium 131 (L) 135 - 145 mmol/L   Potassium 3.6 3.5 - 5.1 mmol/L   Chloride 96 (L) 101 - 111 mmol/L   CO2 30 22 - 32 mmol/L   Glucose, Bld 74 65 - 99 mg/dL   BUN 6 6 - 20 mg/dL   Creatinine, Ser 0.41 (L) 0.44 - 1.00 mg/dL   Calcium 7.8 (L) 8.9 - 10.3 mg/dL   Total Protein 4.6 (L) 6.5 - 8.1 g/dL   Albumin 2.3 (L)  3.5 - 5.0 g/dL   AST 22 15 - 41 U/L   ALT 19 14 - 54 U/L   Alkaline Phosphatase 117 38 - 126 U/L   Total Bilirubin 0.7 0.3 - 1.2 mg/dL   GFR calc non Af Amer >60 >60 mL/min   GFR calc Af Amer >60 >60 mL/min    Comment: (NOTE) The eGFR has been calculated using the CKD EPI equation. This calculation has not been validated in  all clinical situations. eGFR's persistently <60 mL/min signify possible Chronic Kidney Disease.    Anion gap 5 5 - 15    Vitals: Blood pressure 157/89, pulse 76, temperature 98 F (36.7 C), temperature source Oral, resp. rate 18, height 5' 7" (1.702 m), weight 52.118 kg (114 lb 14.4 oz), SpO2 98 %.  Risk to Self: Is patient at risk for suicide?: No Risk to Others:   Prior Inpatient Therapy:   Prior Outpatient Therapy:    Current Facility-Administered Medications  Medication Dose Route Frequency Provider Last Rate Last Dose  . acetaminophen (TYLENOL) tablet 650 mg  650 mg Oral Q6H PRN Kinnie Feil, MD   650 mg at 02/27/15 2149   Or  . acetaminophen (TYLENOL) suppository 650 mg  650 mg Rectal Q6H PRN Kinnie Feil, MD      . cholecalciferol (VITAMIN D) tablet 5,000 Units  5,000 Units Oral Once per day on Mon Wed Fri Kinnie Feil, MD   5,000 Units at 02/28/15 1829  . cycloSPORINE (RESTASIS) 0.05 % ophthalmic emulsion 1 drop  1 drop Both Eyes BID Kinnie Feil, MD   1 drop at 03/01/15 1003  . enoxaparin (LOVENOX) injection 40 mg  40 mg Subcutaneous Q24H Domenic Polite, MD   40 mg at 02/28/15 2243  . feeding supplement (ENSURE ENLIVE) (ENSURE ENLIVE) liquid 237 mL  237 mL Oral BID BM Kallie Locks, RD   237 mL at 02/28/15 1122  . feeding supplement (RESOURCE BREEZE) (RESOURCE BREEZE) liquid 1 Container  1 Container Oral Q1500 Kallie Locks, RD   1 Container at 02/27/15 1600  . fentaNYL (SUBLIMAZE) injection 25 mcg  25 mcg Intravenous Once Delos Haring, PA-C   25 mcg at 02/17/15 1833  . Gerhardt's butt cream   Topical BID Domenic Polite, MD      .  hydrocortisone (ANUSOL-HC) 2.5 % rectal cream   Rectal QID Domenic Polite, MD      . lubiprostone (AMITIZA) capsule 24 mcg  24 mcg Oral BID WC Kinnie Feil, MD   24 mcg at 02/28/15 1829  . magnesium oxide (MAG-OX) tablet 800 mg  800 mg Oral Daily Kinnie Feil, MD   400 mg at 03/01/15 1006  . metoprolol tartrate (LOPRESSOR) tablet 25 mg  25 mg Oral BID Kinnie Feil, MD   25 mg at 03/01/15 1005  . pantoprazole (PROTONIX) EC tablet 40 mg  40 mg Oral Daily Kinnie Feil, MD   40 mg at 02/28/15 1120  . polyethylene glycol (MIRALAX / GLYCOLAX) packet 17 g  17 g Oral BID Domenic Polite, MD   17 g at 02/28/15 2241  . sodium chloride 0.9 % injection 3 mL  3 mL Intravenous Q12H Kinnie Feil, MD   3 mL at 02/27/15 2150  . thyroid (ARMOUR) tablet 60 mg  60 mg Oral QAC breakfast Kinnie Feil, MD   60 mg at 03/01/15 1005    Musculoskeletal: Strength & Muscle Tone: decreased Gait & Station: unable to stand Patient leans: N/A  Psychiatric Specialty Exam: Physical Exam as per history and physical   ROS generalized weakness, confused, disoriented, poorly cooperative, complains about being hungry and denied nausea, vomiting, diarrhea, chest pain and shortness of breath No Fever-chills, No Headache, No changes with Vision or hearing, reports vertigo No problems swallowing food or Liquids, No Chest pain, Cough or Shortness of Breath, No Abdominal pain, No Nausea or Vommitting, Bowel movements are regular, No Blood in stool or Urine,  No dysuria, No new skin rashes or bruises, No new joints pains-aches,  No new weakness, tingling, numbness in any extremity, No recent weight gain or loss, No polyuria, polydypsia or polyphagia,   A full 10 point Review of Systems was done, except as stated above, all other Review of Systems were negative.  Blood pressure 157/89, pulse 76, temperature 98 F (36.7 C), temperature source Oral, resp. rate 18, height 5' 7" (1.702 m), weight 52.118 kg (114  lb 14.4 oz), SpO2 98 %.Body mass index is 17.99 kg/(m^2).  General Appearance: Guarded  Eye Contact::  Good  Speech:  Clear and Coherent  Volume:  Decreased  Mood:  Anxious, Depressed and Irritable  Affect:  Constricted and Depressed  Thought Process:  Disorganized, Irrelevant and Loose  Orientation:  Negative  Thought Content:  Rumination  Suicidal Thoughts:  No  Homicidal Thoughts:  No  Memory:  Immediate;   Fair Recent;   Poor  Judgement:  Impaired  Insight:  Lacking  Psychomotor Activity:  Decreased  Concentration:  Poor  Recall:  Poor  Fund of Knowledge:Poor  Language: Fair  Akathisia:  Negative  Handed:  Right  AIMS (if indicated):     Assets:  Financial Resources/Insurance Leisure Time  ADL's:  Impaired  Cognition: Impaired,  Moderate  Sleep:      Medical Decision Making: Review of Psycho-Social Stressors (1), Review or order clinical lab tests (1), Established Problem, Worsening (2), Review of Last Therapy Session (1), Review or order medicine tests (1), Review of Medication Regimen & Side Effects (2) and Review of New Medication or Change in Dosage (2)  Treatment Plan Summary: Patient has been suffering with significant altered mental status, subacute delirium, poor cognitive functions including orientation, concentration, memory and language functions. Patient has poor insight, judgment and impulse control. Patient cannot make her own medical decisions or living arrangements in this state of mind  Daily contact with patient to assess and evaluate symptoms and progress in treatment and Medication management  Plan: Patient does not meet criteria for capacity to make her own medical decisions and living arrangements as per my evaluation today. Recommended social service evaluation and possible medical care power of attorney if needed  Recommended risperidone 0.5 mg twice daily as needed for irritability, agitation and aggressive behaviors Appreciate psychiatric  consultation Please contact [phone] or [phone] if needs further assistance   Disposition: Patient benefit from the out-of-home placement as she cannot care for herself and unable to understand her current clinical situation.   Alaria Oconnor,JANARDHAHA R. 03/01/2015 10:49 AM

## 2015-03-02 DIAGNOSIS — N390 Urinary tract infection, site not specified: Principal | ICD-10-CM

## 2015-03-02 LAB — BASIC METABOLIC PANEL
ANION GAP: 7 (ref 5–15)
BUN: 5 mg/dL — ABNORMAL LOW (ref 6–20)
CHLORIDE: 95 mmol/L — AB (ref 101–111)
CO2: 30 mmol/L (ref 22–32)
CREATININE: 0.48 mg/dL (ref 0.44–1.00)
Calcium: 8 mg/dL — ABNORMAL LOW (ref 8.9–10.3)
GFR calc Af Amer: >60 mL/min (ref >60)
GLUCOSE: 85 mg/dL (ref 65–99)
Potassium: 3.8 mmol/L (ref 3.5–5.1)
Sodium: 132 mmol/L — ABNORMAL LOW (ref 135–145)

## 2015-03-02 LAB — CLOSTRIDIUM DIFFICILE BY PCR: Toxigenic C. Difficile by PCR: NEGATIVE

## 2015-03-02 LAB — MAGNESIUM: Magnesium: 1.9 mg/dL (ref 1.7–2.4)

## 2015-03-02 MED ORDER — THYROID 60 MG PO TABS: 120.0000 mg | ORAL_TABLET | Freq: Every day | ORAL | Status: AC

## 2015-03-02 MED ORDER — BOOST / RESOURCE BREEZE PO LIQD: 1.0000 | Freq: Every day | ORAL | Status: DC

## 2015-03-02 MED ORDER — ENSURE ENLIVE PO LIQD: 237.0000 mL | Freq: Two times a day (BID) | ORAL | Status: DC

## 2015-03-02 MED ORDER — HYDROCORTISONE 2.5 % RE CREA: TOPICAL_CREAM | Freq: Four times a day (QID) | RECTAL | Status: DC

## 2015-03-02 NOTE — Clinical Social Work Placement (Signed)
   CLINICAL SOCIAL WORK PLACEMENT  NOTE  Date:  03/02/2015  Patient Details  Name: [name] MRN: [id number] Date of Birth: 25-Mar-1927  Clinical Social Work is seeking post-discharge placement for this patient at the [place] level of care (*CSW will initial, date and re-position this form in  chart as items are completed):  Yes   Patient/family provided with [place] Work Department's list of facilities offering this level of care within the geographic area requested by the patient (or if unable, by the patient's family).  Yes   Patient/family informed of their freedom to choose among providers that offer the needed level of care, that participate in Medicare, Medicaid or managed care program needed by the patient, have an available bed and are willing to accept the patient.  Yes   Patient/family informed of [facility]'s ownership interest in [facility] and [facility], as well as of the fact that they are under no obligation to receive care at these facilities.  PASRR submitted to EDS on       PASRR number received on       Existing PASRR number confirmed on 02/20/15     FL2 transmitted to all facilities in geographic area requested by pt/family on 02/20/15     FL2 transmitted to all facilities within larger geographic area on       Patient informed that his/her managed care company has contracts with or will negotiate with certain facilities, including the following:        Yes   Patient/family informed of bed offers received.  Patient chooses bed at [facility]     Physician recommends and patient chooses bed at      Patient to be transferred to [facility] on  03/02/15.  Patient to be transferred to facility by Ambulance     Patient family notified on  03/02/15 of transfer.  Name of family member notified:   [place], [place] ([phone]) by phone   PHYSICIAN Please prepare priority  discharge summary, including medications, Please prepare prescriptions     Additional Comment:    _______________________________________________ Sable Feil, LCSW 03/02/2015, 2:38 PM

## 2015-03-02 NOTE — Progress Notes (Signed)
Utilization review completed.  

## 2015-03-02 NOTE — Discharge Summary (Signed)
Discharge Summary  [name] NGE:[id number] DOB: 1927-07-11  PCP: Cari Caraway, MD  Admit date: 02/17/2015 Discharge date: 03/02/2015  Time spent: >17mins  Recommendations for Outpatient Follow-up:  1. F/u with PMD in two weeks, pmd to repeat tsh in 4-6 weeks, thyroid supplement increased from 60mg  po qd to 120 mg po qd. 2. F/u with cardiology for aortic stenosis 3. F/u with GI for chronic abdominal distension/constipation/diarrhea  Discharge Diagnoses:  Active Hospital Problems   Diagnosis Date Noted  . Subacute delirium 02/21/2015  . Ileus 02/23/2015  . Protein-calorie malnutrition, severe 02/21/2015  . Altered mental state 02/17/2015  . UTI (lower urinary tract infection) 02/17/2015    Resolved Hospital Problems   Diagnosis Date Noted Date Resolved  No resolved problems to display.    Discharge Condition: stable  Diet recommendation: heart healthy/carb modified  Filed Weights   02/26/15 2040 02/27/15 2123 03/01/15 2059  Weight: 54.931 kg (121 lb 1.6 oz) 52.118 kg (114 lb 14.4 oz) 51.755 kg (114 lb 1.6 oz)    History of present illness:  DAINE Benson is a 81 y.o. female with PMH of HTN, Hypothyroidism, Aortic stenosis, PAF (not on anticoagulation due to GIB) presented with unwitnessed fall, laceration to back of head. Patient lives alone, could not remember the fall. Per EMS: patient had blood down the hallway and furniture/pictures knocked off their shelves. Patient denies focal weakness, no paraesthesia, denies cough, no chest pains. She reports dysuria for several days, found to have UTI -E: posterior scalp laceration stapled in ED  Hospital Course:  Principal Problem:   Subacute delirium Active Problems:   Altered mental state   UTI (lower urinary tract infection)   Protein-calorie malnutrition, severe   Ileus  1. Fall suspected due to UTI. Neuro exam is non focal. CT head: no acute findings. Scalp laceration stapled in ED -UTI fully treated, PT/OT  eval, completed, no events on tele thus far -SNF for rehab  2. Enterococcus UTI: was treated with Rocephin, then zosyn, then augmentin - completed course  3. Colonic Ileus: Acute on chronic: -With Massive Abd distention, Colonic Ileus noted on KUB 5/31, with large colonic stool burden -CT abdomen pelvis with contrast 6/1 with marked distention of the colon with gas, large volume stool, and fluid, presumably adynamic ileus, given fleets enema 6/2,  -case discussed with Dr.Buccini 6/2: started Nulytely 1 glass q hour and KCL, had multiple small liquids BMs -repeat KUB 6/3 with persistent Large bowel ileus and now with increased gas in small bowel as well; not really symptomatic; seen by GI; given smog enema multiple times -She has chronic constipation, followed by Dr. Paulita Fujita and has been on Amitiza for this in the past. -h/o colonoscopy 30years ago (1987) and refuses ever having another one in the future -6/4pm with increasing pain and distension, KUB mostly unchanged, per GI, tolerating liquids, enemas given -6/5: rectal tube placed, -6/6 ileus improving with rectal tube, per GI -6/7 KUB unchanged from 6/6, tolerating full liquids -6/8 denies ab pain, no n/v, visible loose stool from rectal tube, pseudodiarrhea? c diff negative -feeling much better, outpatient gi follow up  4. Toxic metabolic encephalopathy/dilirium: -due to UTI?  improved, but has short term memory problems  -6/8, states correct year, place but states she is 79 and she is not in the bed while she is in the bed. Agitated at times, not coherent, psych consulted for medication assistance. Psych recommendation: Patient has been suffering with significant altered mental status, subacute delirium, poor cognitive functions  including orientation, concentration, memory and language functions. Patient has poor insight, judgment and impulse control. Patient cannot make her own medical decisions or living arrangements in this  state of mind  Daily contact with patient to assess and evaluate symptoms and progress in treatment and Medication management  Plan: Patient does not meet criteria for capacity to make her own medical decisions and living arrangements as per my evaluation today. Recommended social service evaluation and possible medical care power of attorney if needed  Recommended risperidone 0.5 mg twice daily as needed for irritability, agitation and aggressive behaviors  5. 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 Moderate AS, + murmur, known to patient, denies chest pain, patient does has falls, not able to obtain detailed history due to altered mental status, due to advanced age, poor baseline function, may not be a good candidate for aggressive intervention,  -outpatient Cards eval -resumed low dose BB  7. Paroxysmal atrial fibrillation,  -Mali score 4-not on anticoagulation because of GI bleed, falls.  Continue metoprolol 25 twice a day-currently rate controlled.  8. Remote history of breast cancer -outpatient surveillance  9. Hypothyroid,  pmd to repeat tsh in 4-6 weeks, thyroid supplement increased from 60mg  po qd to 120 mg po qd.  Code Status: DNR Family Communication: no family at bedside, Dr.Samtani d/w sister x2 so far, Dr Broadus John left her a msg 6/2, updated sister via telephone 6/7 Disposition Plan: SNF  6/10  Florencia Reasons, MDPhD [facility] Pager [phone]    LOS: 5 days     Past medical history-As per Problem list Chart reviewed as below- Reviewed  Consultants:  GI  psych  Procedures:  None  Antibiotics: Finished abx treatment with ceftriaxone , augmentin, zosyn for  Enterococcus UTI     Discharge Exam: BP 118/78 mmHg  Pulse 66  Temp(Src) 98 F (36.7 C) (Oral)  Resp 18  Ht 5\' 7"  (1.702 m)  Wt 51.755 kg (114 lb 1.6  oz)  BMI 17.87 kg/m2  SpO2 100%  Gen: AAOx2, intermittent confusion, intermittent agitation, frail HEENT: PERRLA Stitches on the back of the head/sutures appear intact and clean with mild oozing CVS: Z7Q7/HAL, systolic murmur Chest CTAB Abdomen soft nontender, Mild distention, more soft ,+ BS  Ext: No edema    Discharge Instructions You were cared for by a hospitalist during your hospital stay. If you have any questions about your discharge medications or the care you received while you were in the hospital after you are discharged, you can call the unit and asked to speak with the hospitalist on call if the hospitalist that took care of you is not available. Once you are discharged, your primary care physician will handle any further medical issues. Please note that NO REFILLS for any discharge medications will be authorized once you are discharged, as it is imperative that you return to your primary care physician (or establish a relationship with a primary care physician if you do not have one) for your aftercare needs so that they can reassess your need for medications and monitor your lab values.      Discharge Instructions    AMB Referral to [facility]    Complete by:  As directed   Please assign to [facility]. Patient has already been on radar for telephonic [facility].  However, patient is currently admitted. Likely  to go to [facility]. Consents obtained. Thanks. [name], [place], RN,BSN-[facility] [phone]  Reason for consult:  Please assign to [facility] Licensed CSW  Expected date of contact:  1-3 days (reserved for hospital discharges)     Diet - low sodium heart healthy    Complete by:  As directed      Diet - low sodium heart healthy    Complete by:  As directed      Discharge instructions    Complete by:  As directed   Please see your regular MD in 2-3 days Please complete Amoxicillin in 3 days-take this medicine x 2 per  day Please review your medication list carefully     Increase activity slowly    Complete by:  As directed      Increase activity slowly    Complete by:  As directed             Medication List    TAKE these medications        cholecalciferol 1000 UNITS tablet  Commonly known as:  VITAMIN D  Take 5,000 Units by mouth 3 (three) times a week.     feeding supplement (ENSURE ENLIVE) Liqd  Take 237 mLs by mouth 2 (two) times daily between meals.     feeding supplement (RESOURCE BREEZE) Liqd  Take 1 Container by mouth daily at 3 pm.     GOLD BOND MEDICATED BODY 5-0.15 % Lotn  Apply 1 application topically daily as needed (dry skin).     hydrocortisone 2.5 % rectal cream  Commonly known as:  ANUSOL-HC  Place rectally 4 (four) times daily.     Magnesium 500 MG Caps  Take 1,000 mg by mouth daily.     metoprolol tartrate 25 MG tablet  Commonly known as:  LOPRESSOR  Take 1 tablet (25 mg total) by mouth 2 (two) times daily.     OVER THE COUNTER MEDICATION  Take 30 mLs by mouth daily.     PROBIOTIC DAILY PO  Take 1 tablet by mouth daily as needed (for digestion).     SYSTANE PRESERVATIVE FREE 0.4-0.3 % Soln  Generic drug:  Polyethyl Glycol-Propyl Glycol  Place 1 drop into both eyes 4 (four) times daily as needed (dry eyes).     thyroid 60 MG tablet  Commonly known as:  ARMOUR THYROID  Take 2 tablets (120 mg total) by mouth daily before breakfast.       Allergies  Allergen Reactions  . Cephalexin Nausea And Vomiting  . Linzess [Linaclotide] Other (See Comments)    Fatigue and muscle weakness  . Ultram [Tramadol Hcl] Nausea Only  . Latex Rash    Skin rash with bandaids and tapes  . Tape Rash    Reaction to adhesive tape   Follow-up Information    Follow up with SIMMONS, BRITTAINY, PA-C In 2 weeks.   Specialties:  Cardiology, Radiology   Why:  aortic stenosis   Contact information:   [address] [place] [address] [phone]       Follow  up with Landry Dyke, MD In 2 weeks.   Specialty:  Gastroenterology   Why:  chronic diarrhea/abdominal distension   Contact information:   [address] [place] [address] [phone]       Follow up with [facility], MD In 2 weeks.   Specialty:  Family Medicine   Why:  hospital discharge follow up, repeat tsh in 4-6 weeks  Contact information:   [place] [address] [phone]        The results of significant diagnostics from this hospitalization (including imaging, microbiology, ancillary and laboratory) are listed below for reference.    Significant Diagnostic Studies: Dg Chest 1 View  02/17/2015   CLINICAL DATA:  Altered mental status today. Unable to follow instructions. Complaining of upper back pain. Confusion.  EXAM: CHEST  1 VIEW  COMPARISON:  01/19/2006.  FINDINGS: Cardiac silhouette normal in size. No mediastinal or hilar masses or evidence of adenopathy.  Lungs are clear.  No pleural effusion or pneumothorax.  Bony thorax is diffusely demineralized. There are changes from previous left breast surgery, stable.  IMPRESSION: No acute cardiopulmonary disease.   Electronically Signed   By: Lajean Manes M.D.   On: 02/17/2015 16:00   Dg Abd 1 View  02/28/2015   CLINICAL DATA:  Follow-up of ileus  EXAM: ABDOMEN - 1 VIEW  COMPARISON:  Abdominal film of February 27, 2015  FINDINGS: There remains moderate gaseous distention of predominantly large bowel on today's study. The decubitus films reveal no free extraluminal gas collections. There are surgical clips in the gallbladder fossa. There are mild degenerative changes of the lower lumbar spine. There is dense calcification in the wall of the abdominal aorta.  IMPRESSION: There has been some distal migration of colonic gas such that much of the gas lies in the left colon. No free extraluminal gas collections are observed.   Electronically Signed   By: David  Martinique M.D.   On: 02/28/2015 09:14    Dg Abd 1 View  02/27/2015   CLINICAL DATA:  Abdominal pain.  Ileus.  EXAM: ABDOMEN - 1 VIEW  COMPARISON:  02/26/2015  FINDINGS: Diffuse gaseous distention predominately involving the colon shows no significant change. Cecum measures approximately 8 cm in diameter.  IMPRESSION: Diffuse gaseous distention predominately involving the colon shows no significant interval change compared to most recent prior.   Electronically Signed   By: Earle Gell M.D.   On: 02/27/2015 08:07   Dg Abd 1 View  02/23/2015   CLINICAL DATA:  Ileus, abdominal distention, back pain  EXAM: ABDOMEN - 1 VIEW  COMPARISON:  Abdominopelvic CT scan of February 21, 2015 and acute abdominal series of Feb 20, 2015.  FINDINGS: There remain loops of markedly distended small and large bowel with the sigmoid being the most distended. This has not appreciably changed since the previous studies. No free extraluminal gas collections are demonstrated.  There is calcification in the wall of the lower abdominal aorta and common iliac vessels. There degenerative changes of the lumbar spine. Calcifications in the pelvis likely lie within the uterus.  IMPRESSION: Persistent diffuse ileus without evidence of perforation. Nasogastric suction may be useful.   Electronically Signed   By: David  Martinique M.D.   On: 02/23/2015 07:38   Ct Head Wo Contrast  02/17/2015   CLINICAL DATA:  Altered mental status  EXAM: CT HEAD WITHOUT CONTRAST  TECHNIQUE: Contiguous axial images were obtained from the base of the skull through the vertex without intravenous contrast.  COMPARISON:  03/16/2009  FINDINGS: No skull fracture is noted at there is scalp swelling bilateral posterior parietal region high convexity. No intracranial hemorrhage, mass effect or midline shift. Stable cerebral atrophy. No acute cortical infarction. Stable periventricular and patchy subcortical chronic white matter disease. Mild atherosclerotic calcifications of carotid siphon. Paranasal sinuses and mastoid  air cells are unremarkable.  IMPRESSION: No acute intracranial abnormality.  Stable atrophy and chronic white matter disease. There is scalp swelling bilateral posterior parietal region high convexity.   Electronically Signed   By: Lahoma Crocker M.D.   On: 02/17/2015 15:39   Ct Abdomen Pelvis W Contrast  02/21/2015   CLINICAL DATA:  Abdominal pain, distention.  EXAM: CT ABDOMEN AND PELVIS WITH CONTRAST  TECHNIQUE: Multidetector CT imaging of the abdomen and pelvis was performed using the standard protocol following bolus administration of intravenous contrast.  CONTRAST:  28mL OMNIPAQUE IOHEXOL 300 MG/ML  SOLN  COMPARISON:  Plain films 02/20/2015  FINDINGS: Lower chest: There are small to moderate bilateral pleural effusions with compressive atelectasis in the lower lobes. Heart is normal size.  Hepatobiliary: Multiple low-density lesions throughout the liver compatible with cysts. Gallbladder unremarkable. No biliary duct dilatation.  Pancreas: No focal abnormality or ductal dilatation.  Spleen: No focal abnormality.  Normal size.  Adrenals/Urinary Tract: No focal renal or adrenal abnormality. No hydronephrosis. Urinary bladder is unremarkable.  Stomach/Bowel: Marked distention of the colon diffusely with stool, fluid and gas. Findings presumably represent adynamic ileus. Stomach and small bowel are decompressed.  Vascular/Lymphatic: Aorta and iliac vessels are calcified, non aneurysmal.  Reproductive: No mass or other significant abnormality.  Other: Perihepatic and perisplenic ascites noted. Edema throughout the mesentery and abdominal wall with anasarca type pattern.  Musculoskeletal: Multi level moderate to severe compression fractures throughout the lumbar spine and lower thoracic spine.  IMPRESSION: Marked distention of the colon with gas, large volume stool, and fluid, presumably adynamic ileus.  Small to moderate bilateral effusions with compressive atelectasis in the lower lobes.  Mild ascites. Edema  throughout the abdominal wall and mesentery/peritoneal with anasarca like pattern.   Electronically Signed   By: Rolm Baptise M.D.   On: 02/21/2015 17:04   Dg Abd 2 Views  02/26/2015   CLINICAL DATA:  81 year old female with a history of abdominal distention.  EXAM: ABDOMEN - 2 VIEW  COMPARISON:  CT 02/21/2015.  Plain film 02/24/2015, 02/25/2015.  FINDINGS: Compared to prior plain film, there is decreased volume of colonic gas on the current study, with mild distension of both small bowel and colon.  No evidence of free air.  There are a few air-fluid levels persisting on the decubitus image.  Dense calcifications of the vasculature.  IMPRESSION: Improving colonic distention, suggesting improvement in the ileus/obstruction. There is, however, persisting gas throughout the GI system with a few air-fluid levels, and surveillance with plain film may be useful to assure continued improvement/ resolution.  These results were called by telephone at the time of interpretation on 02/26/2015 at 9:10 am to Dr. Arta Silence , who verbally acknowledged these results.  Atherosclerosis.  Signed,  Dulcy Fanny. Earleen Newport, DO  [facility]  [facility]   Electronically Signed   By: Corrie Mckusick D.O.   On: 02/26/2015 09:10   Dg Abd 2 Views  02/25/2015   CLINICAL DATA:  LEFT upper abdominal pain, abdominal distension, diarrhea, history breast cancer, hypertension, collagen vascular disease  EXAM: ABDOMEN - 2 VIEW  COMPARISON:  02/24/2015  FINDINGS: Gaseous distention of colon and small bowel loops throughout abdomen.  No definite bowel wall thickening or free intraperitoneal air.  Bones demineralized.  Scattered atherosclerotic calcifications.  Questionable tiny nonobstructing RIGHT renal calculus.  IMPRESSION: Diffuse gaseous distention of large and small bowel loops throughout abdomen, including distal colon, favor ileus.   Electronically Signed   By: Lavonia Dana M.D.   On: 02/25/2015  10:29  Dg Abd 2 Views  02/01/2015   CLINICAL DATA:  Chronic constipation, abdominal distention and pain  EXAM: ABDOMEN - 2 VIEW  COMPARISON:  Abdomen film of 01/04/2015 and 03/09/2013  FINDINGS: There is a moderate to large amount of feces throughout the colon. Some gaseous distention of colon is noted which has been noted previously most likely chronic in nature. No bowel obstruction is seen. No free air is noted on the erect view. A probable small calcified fibroid is noted in the mid pelvis.  IMPRESSION: Moderate to large amount of feces throughout the colon. Probable chronic gaseous distention of bowel. No definite obstruction.   Electronically Signed   By: Ivar Drape M.D.   On: 02/01/2015 16:49   Dg Abd Acute W/chest  02/20/2015   CLINICAL DATA:  81 year old admitted 3 days ago after an accidental fall secondary to metabolic encephalopathy. Patient has subsequently developed diarrhea. Patient also diagnosed with enterococcal pyelonephritis.  EXAM: DG ABDOMEN ACUTE W/ 1V CHEST  COMPARISON:  Abdomen x-rays 02/01/2015 and earlier. One view chest x-ray 02/17/2015. CT abdomen and pelvis 09/26/2009.  FINDINGS: Marked gaseous distension of what I believe is the sigmoid colon, based on the anatomy seen on the prior CT, extending upward well above the umbilicus. No evidence of colonic distention proximal to this loop. Very large stool burden throughout the colon. No evidence of free intraperitoneal air on the lateral decubitus image. Air-fluid levels in the colon. No small bowel distention. Aortoiliac atherosclerosis without aneurysm.  Interval development of patchy airspace opacities in the right lung base and dense consolidation in the left lower lobe since the chest examination 3 days ago. Small to moderate size right pleural effusion. Linear atelectasis in the lingula. Cardiac silhouette mildly to moderately enlarged, unchanged. Pulmonary vascularity normal without evidence of pulmonary edema.  IMPRESSION:  1. Marked gaseous distention of what I believe is the sigmoid colon, based on the anatomy identified on a prior CT from 2011. As there is no colonic distention proximal to this, sigmoid volvulus is unlikely and severe ileus is favored. 2. Very large colonic stool burden. 3. No evidence of small bowel distention to suggest obstruction. 4. Interval development of patchy pneumonia involving the right lung base, dense left lower lobe atelectasis and/or pneumonia, and a small to moderate sized right pleural effusion since the chest x-ray 3 days ago.   Electronically Signed   By: Evangeline Dakin M.D.   On: 02/20/2015 14:46   Dg Abd Portable 1v  02/24/2015   CLINICAL DATA:  Diffuse abdominal pain and distention.  EXAM: PORTABLE ABDOMEN - 1 VIEW  COMPARISON:  02/24/2015  FINDINGS: Marked diffuse colonic dilatation is again demonstrated, and without significant change. No definite dilated small bowel loops visualized.  IMPRESSION: Marked diffuse colonic dilatation, without significant interval change.   Electronically Signed   By: Earle Gell M.D.   On: 02/24/2015 20:40   Dg Abd Portable 1v  02/24/2015   CLINICAL DATA:  Ileus follow-up  EXAM: PORTABLE ABDOMEN - 1 VIEW  COMPARISON:  02/23/2015  FINDINGS: There is gas distended colon similar prior. No clear evidence of dilated small bowel. No evidence of intraperitoneal free air on this supine exam.  IMPRESSION: No significant change in colonic ileus.   Electronically Signed   By: Suzy Bouchard M.D.   On: 02/24/2015 14:35    Microbiology: Recent Results (from the past 240 hour(s))  Clostridium Difficile by PCR (not at [facility])     Status: None   Collection Time: 03/01/15  3:43 PM  Result Value Ref Range Status   C difficile by pcr NEGATIVE NEGATIVE Final     Labs: Basic Metabolic Panel:  Recent Labs Lab 02/26/15 0442 02/27/15 0550 02/28/15 0522 03/01/15 0502 03/02/15 0417  NA 136 134* 130* 131* 132*  K 2.9* 4.0 3.7 3.6 3.8  CL 98* 98* 95* 96* 95*   CO2 30 31 29 30 30   GLUCOSE 92 88 83 74 85  BUN 8 7 6 6  5*  CREATININE 0.42* 0.50 0.33* 0.41* 0.48  CALCIUM 8.2* 8.0* 7.7* 7.8* 8.0*  MG  --   --   --   --  1.9   Liver Function Tests:  Recent Labs Lab 03/01/15 0502  AST 22  ALT 19  ALKPHOS 117  BILITOT 0.7  PROT 4.6*  ALBUMIN 2.3*   No results for input(s): LIPASE, AMYLASE in the last 168 hours. No results for input(s): AMMONIA in the last 168 hours. CBC:  Recent Labs Lab 02/26/15 0442 02/27/15 0550 02/28/15 0522  WBC 6.6 5.0 3.6*  HGB 11.5* 10.4* 10.9*  HCT 33.4* 30.2* 31.7*  MCV 93.0 92.9 93.0  PLT 199 181 179   Cardiac Enzymes: No results for input(s): CKTOTAL, CKMB, CKMBINDEX, TROPONINI in the last 168 hours. BNP: BNP (last 3 results) No results for input(s): BNP in the last 8760 hours.  ProBNP (last 3 results) No results for input(s): PROBNP in the last 8760 hours.  CBG: No results for input(s): GLUCAP in the last 168 hours.     SignedFlorencia Reasons MD, PhD  [facility] 03/02/2015, 12:05 PM

## 2015-03-02 NOTE — Progress Notes (Signed)
[name] to be D/C'd Skilled nursing facility per MD order.  Discussed prescriptions and follow up appointments with the patient. Prescriptions given to patient, medication list explained in detail. Pt verbalized understanding.    Medication List    TAKE these medications        cholecalciferol 1000 UNITS tablet  Commonly known as:  VITAMIN D  Take 5,000 Units by mouth 3 (three) times a week.     feeding supplement (ENSURE ENLIVE) Liqd  Take 237 mLs by mouth 2 (two) times daily between meals.     feeding supplement (RESOURCE BREEZE) Liqd  Take 1 Container by mouth daily at 3 pm.     GOLD BOND MEDICATED BODY 5-0.15 % Lotn  Apply 1 application topically daily as needed (dry skin).     hydrocortisone 2.5 % rectal cream  Commonly known as:  ANUSOL-HC  Place rectally 4 (four) times daily.     Magnesium 500 MG Caps  Take 1,000 mg by mouth daily.     metoprolol tartrate 25 MG tablet  Commonly known as:  LOPRESSOR  Take 1 tablet (25 mg total) by mouth 2 (two) times daily.     OVER THE COUNTER MEDICATION  Take 30 mLs by mouth daily.     PROBIOTIC DAILY PO  Take 1 tablet by mouth daily as needed (for digestion).     SYSTANE PRESERVATIVE FREE 0.4-0.3 % Soln  Generic drug:  Polyethyl Glycol-Propyl Glycol  Place 1 drop into both eyes 4 (four) times daily as needed (dry eyes).     thyroid 60 MG tablet  Commonly known as:  ARMOUR THYROID  Take 2 tablets (120 mg total) by mouth daily before breakfast.        Filed Vitals:   03/02/15 1000  BP: 118/78  Pulse: 66  Temp: 98 F (36.7 C)  Resp: 18    Skin clean, dry and intact without evidence of skin break down, no evidence of skin tears noted. IV catheter discontinued intact. Site without signs and symptoms of complications. Dressing and pressure applied. Pt denies pain at this time. No complaints noted.  An After Visit Summary was printed and given to the patient. Patient escorted via stretcher to SNF    [name] 03/02/2015 4:34 PM

## 2015-03-02 NOTE — Progress Notes (Signed)
PT Cancellation Note  Patient Details Name: MILANNA KOZLOV MRN: [id number] DOB: 1926/09/25   Cancelled Treatment:    Reason Eval/Treat Not Completed: Patient declined, no reason specified;Fatigue/lethargy limiting ability to participate   Ramond Dial 03/02/2015, 2:08 PM   Mee Hives, PT MS Acute Rehab Dept. Number: [facility] [id number] and [place] [facility] [phone]

## 2015-03-15 ENCOUNTER — Other Ambulatory Visit: Payer: Self-pay | Admitting: *Deleted

## 2015-03-15 NOTE — Patient Outreach (Signed)
[facility]) Care Management  03/15/2015  [name] Jan 29, 1927 [id number]  Care coordination phone call bu this social worker providing coverage for assigned social worker.  Spoke to [employer] at [facility] to follow up on discharge plan for patient.  According to the social worker at the facility patient will be discharging to an assisted living.  The social worker has coordinated facility tours for patient and once she has made a decision as to which facility she would like to go to they will set a discharge date.  Patient currently touring [facility] and has another tour scheduled for tomorrow with another facility.  This social worker will inform assigned social worker of patient's discharge to an Assisted Living.    Sheralyn Boatman [facility] Care Management [phone]

## 2015-03-16 ENCOUNTER — Encounter: Payer: Self-pay | Admitting: Cardiology

## 2015-03-16 ENCOUNTER — Ambulatory Visit (INDEPENDENT_AMBULATORY_CARE_PROVIDER_SITE_OTHER): Payer: Medicare Other | Admitting: Cardiovascular Disease

## 2015-03-16 VITALS — BP 104/52 | HR 88 | Ht 67.0 in | Wt 107.5 lb

## 2015-03-16 DIAGNOSIS — I350 Nonrheumatic aortic (valve) stenosis: Secondary | ICD-10-CM

## 2015-03-16 NOTE — Progress Notes (Signed)
03/16/2015 [name]   1927/02/10  [id number]  Primary Physician MCNEILL,WENDY, MD Primary Cardiologist: New   Reason for Visit/CC: F/U for Aortic Stenosis  HPI:  The patient is a 81 y/o female with a history of PAF, not on anticoagulation 2/2 h/o GIB and falls. 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 She was recently admitted to [facility] by Internal Medicine from 02/17/15-03/02/15 for a fall that she suffered at home. W/u was negative for CVA and ACS. She was found to have a UTI and had subacute delirium. She was treated with antibiotics and discharged to a SNF. Per discharge summary, there was concern given a notable systolic murmur on exam and evidence of moderate stenosis on recent echo 12/20/14 which demonstrated an AVA of 1.08 cm2 and a mean gradient of 23 mm Hg. Peak gradient was 40 mm Hg. She was also noted to have MVP with moderate MR. EF was well preserved at 60-65%. No WMA and only G1DD. She was instructed by IM to f/u in our office for further assessment/ recommendations regarding her AS.  She is accompanied to clinic today by a nursing assistant from her SNF. She denies any symptoms of chest pain, dyspnea, syncope/ near syncope. She notes chronic LEE that worsens throughout the day. She has never seen a cardiologist and denies any h/o CAD. BP and HR are both well controlled today. EKG shows NSR. She denies any palpitations. She reports full medication compliance and adherence to a low sodium diet.    Current Outpatient Prescriptions  Medication Sig Dispense Refill  . amoxicillin (AMOXIL) 500 MG capsule Take 500 mg by mouth. For dental use only  0  . cholecalciferol (VITAMIN D) 1000 UNITS tablet Take 5,000 Units by mouth 3 (three) times a week.    . Emollient (GOLD BOND MEDICATED BODY) 5-0.15 % LOTN Apply 1 application topically daily as needed (dry skin).    . feeding supplement, ENSURE ENLIVE, (ENSURE ENLIVE) LIQD Take 237 mLs by  mouth 2 (two) times daily between meals. 237 mL 12  . feeding supplement, RESOURCE BREEZE, (RESOURCE BREEZE) LIQD Take 1 Container by mouth daily at 3 pm. 30 Container 0  . hydrocortisone (ANUSOL-HC) 2.5 % rectal cream Place rectally 4 (four) times daily. 30 g 0  . Magnesium 500 MG CAPS Take 1,000 mg by mouth daily.    . metoprolol tartrate (LOPRESSOR) 25 MG tablet Take 1 tablet (25 mg total) by mouth 2 (two) times daily. (Patient taking differently: Take 25 mg by mouth daily. ) 60 tablet 0  . OVER THE COUNTER MEDICATION Take 30 mLs by mouth daily.    Vladimir Faster Glycol-Propyl Glycol (SYSTANE PRESERVATIVE FREE) 0.4-0.3 % SOLN Place 1 drop into both eyes 4 (four) times daily as needed (dry eyes).     . Probiotic Product (PROBIOTIC DAILY PO) Take 1 tablet by mouth daily as needed (for digestion).    Marland Kitchen thyroid (ARMOUR THYROID) 60 MG tablet Take 2 tablets (120 mg total) by mouth daily before breakfast. 30 tablet 0   No current facility-administered medications for this visit.    Allergies  Allergen Reactions  . Cephalexin Nausea And Vomiting  . Linzess [Linaclotide] Other (See Comments)    Fatigue and muscle weakness  . Ultram [Tramadol Hcl] Nausea Only  . Latex Rash    Skin rash with bandaids and tapes  . Tape Rash    Reaction to adhesive tape    History  Social History  . Marital Status: Single    Spouse Name: N/A  . Number of Children: N/A  . Years of Education: N/A   Occupational History  . Retired    Social History Main Topics  . Smoking status: Never Smoker   . Smokeless tobacco: Not on file  . Alcohol Use: No  . Drug Use: No  . Sexual Activity: No   Other Topics Concern  . Not on file   Social History Narrative   She lives alone.     Review of Systems: General: negative for chills, fever, night sweats or weight changes.  Cardiovascular: negative for chest pain, dyspnea on exertion, edema, orthopnea, palpitations, paroxysmal nocturnal dyspnea or shortness of  breath Dermatological: negative for rash Respiratory: negative for cough or wheezing Urologic: negative for hematuria Abdominal: negative for nausea, vomiting, diarrhea, bright red blood per rectum, melena, or hematemesis Neurologic: negative for visual changes, syncope, or dizziness All other systems reviewed and are otherwise negative except as noted above.    Blood pressure 104/52, pulse 88, height 5\' 7"  (1.702 m), weight 107 lb 8 oz (48.762 kg).  General appearance: alert, cooperative and no distress Neck: no JVD, + radiation of murmur to carotids Lungs: clear to auscultation bilaterally Heart: regular rate and rhythm and 3/6 mid systolic mumur throughout the precordim, loudest at RUSB Extremities: 2+ pitting LEE on the left, 1+ on the right Pulses: 2+ and symmetric Skin: warm and dry Neurologic: Grossly normal    EKG NSR 88 bpm  ASSESSMENT AND PLAN:    1. Aortic Stenosis: recent 2D echo 12/20/14 demonstrated only moderate AS w/ AVA of 1.08 cm2 and a mean gradient of 23 mm Hg. Peak gradient 40 mm Hg. EF well persevered at 60-65%. She is asymptomatic w/o chest pain, dyspnea or syncope. Recommend f/u with a cardiologist in 6 months.   2. MVP with Moderate MR: noted on recent echo 12/20/14. Asymptomatic. F/u with a cardiologist in 6 months.   3. PAF: NSR on EKG. HR in the 80s. Continue BB therapy with metoprolol. Not an anticoagulation candidate 2/2 to h/o GIB and falls.   4. HTN: well controlled on current regimen  5. Hypothyroidism: followed by PCP  PLAN  Patient was seen by Dr. Claiborne Billings. She does not have an established cardiologist and is adamant that she does not want to be followed by a [facility] affiliated MD, given her recent experience during her last hospitalization. It was stressed to her that her valvular disease will need to be followed closely. It was recommended that she establish care with an outside cardiologist. Recommendations are either for [facility], [facility], [facility] or [facility].    [name] PA-C 03/16/2015 10:20 AM  Patient seen and examined. Agree with assessment and plan. Patient is an 81 year old female who has a history of paroxysmal atrial fibrillation but is not an anticoagulation candidate secondary to prior GI bleed and falls.  She recently was found to have at least moderate aortic valve stenosis on echocardiography when she was hospitalized with urinary tract infection and subacute delirium. An echo Doppler study revealed  A peak transvalvular aortic gradient of 40 mm, a mean gradient of 23 mm, and an estimated aortic valve area of 1.08 cm.  This is consistent with moderate aortic valve stenosis, most likely of degenerative etiology.  Since hospital discharge, she specifically denies any episodes of chest pressure, syncope, near-syncope, or development of significant shortness of breath.  She denies PND or orthopnea. I had a long  discussion with her today concerning the natural history of aortic stenosis and specifically discussed with her symptoms associated with this disease.  I discussed potential future options if this were to progress.  Apparently, the patient did not have a good experience at [facility] during her recent admission with UTI and delirium.  She was not evaluated by cardiology during that admission.  At present, she seems to be fairly emphatic that she most likely will seek another institution for her potential future care.  I was very polite, informative and empathetic with the patient and offered my assistance in the future if she reconsiders her treatment options.  Troy Sine, MD, [facility] 03/17/2015 11:53 AM

## 2015-03-16 NOTE — Patient Instructions (Addendum)
Your physician has recommended you make the following change in your medication: No medication changes

## 2015-03-20 ENCOUNTER — Other Ambulatory Visit: Payer: Self-pay | Admitting: *Deleted

## 2015-03-20 ENCOUNTER — Encounter: Payer: Self-pay | Admitting: *Deleted

## 2015-03-20 NOTE — Patient Outreach (Signed)
[facility]) Care Management  03/20/2015  EXILDA WILHITE May 17, 1927 [id number]  CSW received report from [facility] [place] colleague with [facility], that patient currently resides at [facility], where patient was placed upon release from [facility] to receive short-term rehabilitative services. Mrs. Jenita Seashore was able to converse with Malena Peer, Admissions Coordinator/Licensed Clinical Social Worker at [facility], on June 23rd to discuss discharge plans for patient.  Patient and family (sister - Ayris Carano) are all in agreement that patient will no longer be able to return home to live independently.  Upon release from [facility], patient plans to go to [facility] for long-term care placement.  No additional social work needs have been identified at this time; therefore, CSW will perform a case closure on patient. CSW will no longer make arrangements to contact patient by phone or in person, as all goals of treatment have been met from social work standpoint and no additional social work needs have been identified at this time.  CSW will notify Natividad Brood, referral source and Hospital Liaison with [facility] Management, of CSW's plans to close patients case.  CSW will submit a case closure request to Lurline Del, Care Management Assistant with [facility] Management, in the form of an [employer]. CSW will fax a correspondence letter to patient's Primary Care Physician, Dr. Cari Caraway to report social work involvement with patient.  Nat Christen, BSW, MSW, [facility] [place], [address] [place], [place] [address] [email or web address] [phone]

## 2015-03-21 NOTE — Patient Outreach (Signed)
[facility]) Care Management  03/21/2015  KINZLEIGH KANDLER 1926-11-30 [id number]   Notification from Nat Christen, LCSW to close case due to patient will transfer to [facility] for care.  [name]. [place], [place] Management [place] Assistant Phone: [phone] Fax: [phone]

## 2015-04-01 ENCOUNTER — Encounter (HOSPITAL_COMMUNITY): Payer: Self-pay | Admitting: Oncology

## 2015-04-01 ENCOUNTER — Emergency Department (HOSPITAL_COMMUNITY): Payer: Medicare Other

## 2015-04-01 ENCOUNTER — Emergency Department (HOSPITAL_COMMUNITY)
Admission: EM | Admit: 2015-04-01 | Discharge: 2015-04-01 | Disposition: A | Discharge status: Home / Self Care | Payer: Medicare Other | Attending: Emergency Medicine | Admitting: Emergency Medicine

## 2015-04-01 DIAGNOSIS — I480 Paroxysmal atrial fibrillation: Secondary | ICD-10-CM | POA: Insufficient documentation

## 2015-04-01 DIAGNOSIS — R05 Cough: Secondary | ICD-10-CM | POA: Diagnosis not present

## 2015-04-01 DIAGNOSIS — L03116 Cellulitis of left lower limb: Secondary | ICD-10-CM | POA: Diagnosis not present

## 2015-04-01 DIAGNOSIS — I10 Essential (primary) hypertension: Secondary | ICD-10-CM | POA: Insufficient documentation

## 2015-04-01 DIAGNOSIS — Z91040 Latex allergy status: Secondary | ICD-10-CM | POA: Diagnosis not present

## 2015-04-01 DIAGNOSIS — Z9181 History of falling: Secondary | ICD-10-CM | POA: Insufficient documentation

## 2015-04-01 DIAGNOSIS — R21 Rash and other nonspecific skin eruption: Secondary | ICD-10-CM | POA: Diagnosis not present

## 2015-04-01 DIAGNOSIS — Z7952 Long term (current) use of systemic steroids: Secondary | ICD-10-CM | POA: Insufficient documentation

## 2015-04-01 DIAGNOSIS — Z859 Personal history of malignant neoplasm, unspecified: Secondary | ICD-10-CM | POA: Insufficient documentation

## 2015-04-01 DIAGNOSIS — M7989 Other specified soft tissue disorders: Secondary | ICD-10-CM | POA: Diagnosis present

## 2015-04-01 DIAGNOSIS — Z853 Personal history of malignant neoplasm of breast: Secondary | ICD-10-CM | POA: Diagnosis not present

## 2015-04-01 DIAGNOSIS — E079 Disorder of thyroid, unspecified: Secondary | ICD-10-CM | POA: Insufficient documentation

## 2015-04-01 DIAGNOSIS — Z79899 Other long term (current) drug therapy: Secondary | ICD-10-CM | POA: Insufficient documentation

## 2015-04-01 DIAGNOSIS — Z8719 Personal history of other diseases of the digestive system: Secondary | ICD-10-CM | POA: Insufficient documentation

## 2015-04-01 DIAGNOSIS — M810 Age-related osteoporosis without current pathological fracture: Secondary | ICD-10-CM | POA: Diagnosis not present

## 2015-04-01 LAB — CBC WITH DIFFERENTIAL/PLATELET
BASOS PCT: 0 % (ref 0–1)
Basophils Absolute: 0 10*3/uL (ref 0.0–0.1)
EOS ABS: 0 10*3/uL (ref 0.0–0.7)
EOS PCT: 1 % (ref 0–5)
HCT: 28.2 % — ABNORMAL LOW (ref 36.0–46.0)
HEMOGLOBIN: 9.3 g/dL — AB (ref 12.0–15.0)
Lymphocytes Relative: 14 % (ref 12–46)
Lymphs Abs: 0.8 10*3/uL (ref 0.7–4.0)
MCH: 31.7 pg (ref 26.0–34.0)
MCHC: 33 g/dL (ref 30.0–36.0)
MCV: 96.2 fL (ref 78.0–100.0)
MONO ABS: 0.7 10*3/uL (ref 0.1–1.0)
MONOS PCT: 12 % (ref 3–12)
NEUTROS PCT: 73 % (ref 43–77)
Neutro Abs: 4.4 10*3/uL (ref 1.7–7.7)
Platelets: 235 10*3/uL (ref 150–400)
RBC: 2.93 MIL/uL — ABNORMAL LOW (ref 3.87–5.11)
RDW: 15.8 % — ABNORMAL HIGH (ref 11.5–15.5)
WBC: 6 10*3/uL (ref 4.0–10.5)

## 2015-04-01 LAB — BASIC METABOLIC PANEL
Anion gap: 6 (ref 5–15)
BUN: 18 mg/dL (ref 6–20)
CALCIUM: 8 mg/dL — AB (ref 8.9–10.3)
CO2: 28 mmol/L (ref 22–32)
Chloride: 100 mmol/L — ABNORMAL LOW (ref 101–111)
Creatinine, Ser: 0.35 mg/dL — ABNORMAL LOW (ref 0.44–1.00)
Glucose, Bld: 91 mg/dL (ref 65–99)
Potassium: 3.3 mmol/L — ABNORMAL LOW (ref 3.5–5.1)
SODIUM: 134 mmol/L — AB (ref 135–145)

## 2015-04-01 LAB — D-DIMER, QUANTITATIVE: D-Dimer, Quant: 1.18 ug/mL-FEU — ABNORMAL HIGH (ref 0.00–0.48)

## 2015-04-01 MED ORDER — DOXYCYCLINE HYCLATE 100 MG PO CAPS: 100.0000 mg | ORAL_CAPSULE | Freq: Two times a day (BID) | ORAL | Status: DC

## 2015-04-01 MED ORDER — BENZONATATE 100 MG PO CAPS
200.0000 mg | ORAL_CAPSULE | Freq: Once | ORAL | Status: AC
  Administered 2015-04-01: 200 mg via ORAL
  Filled 2015-04-01: qty 2

## 2015-04-01 MED ORDER — BENZONATATE 100 MG PO CAPS: 100.0000 mg | ORAL_CAPSULE | Freq: Three times a day (TID) | ORAL | Status: DC

## 2015-04-01 NOTE — ED Notes (Signed)
Pt presents d/t redness and swelling in left leg.  Pt cannot elaborate on how long leg has been this way.  Denies pain.

## 2015-04-01 NOTE — ED Notes (Signed)
Pt's last 2 documented O2 sats are incorrect as pt did not have pulse ox on properly.  Spoke w/ Pam at [facility] and gave report.

## 2015-04-01 NOTE — ED Provider Notes (Signed)
CSN: [id number]     Arrival date & time 04/01/15  0203 History   First MD Initiated Contact with Patient 04/01/15 0226     Chief Complaint  Patient presents with  . Cellulitis     (Consider location/radiation/quality/duration/timing/severity/associated sxs/prior Treatment) HPI Comments: 81 y/o female with a history of PAF, not on anticoagulation 2/2 h/o GIB and falls. Also with aortic stenosis, HTN who comes to the ER with cc of leg swelling and redness. Pt has LLE unilateral swelling for several weeks now, however now she has increased redness and pain, so she came to the ER. She has no n/v/f/c. She also has a hacking coigh, no chest pain or dib. She has been treating the cough as bronchitis, with no relief.   ROS 10 Systems reviewed and are negative for acute change except as noted in the HPI.     The history is provided by the patient.    Past Medical History  Diagnosis Date  . Cancer   . Breast cancer, left breast   . Osteoporosis   . Thyroid disease   . Hypertension   . MVP (mitral valve prolapse)   . Collagen vascular disease    Past Surgical History  Procedure Laterality Date  . Breast lumpectomy    . Hemrrhoidectomy    . Saline implant after left mastectomy    . Esophagogastroduodenoscopy N/A 12/19/2014    Procedure: ESOPHAGOGASTRODUODENOSCOPY (EGD);  Surgeon: Teena Irani, MD;  Location: [facility];  Service: Endoscopy;  Laterality: N/A;   Family History  Problem Relation Age of Onset  . Cancer Sister     breast    History  Substance Use Topics  . Smoking status: Never Smoker   . Smokeless tobacco: Not on file  . Alcohol Use: No   OB History    No data available     Review of Systems  Respiratory: Positive for cough.   Musculoskeletal: Positive for myalgias.  Skin: Positive for rash.      Allergies  Cephalexin; Linzess; Ultram; Latex; and Tape  Home Medications   Prior to Admission medications   Medication Sig Start Date End Date Taking?  Authorizing Provider  amoxicillin (AMOXIL) 500 MG capsule Take 2,000 mg by mouth daily as needed (prior to dental appointment). For dental use only 02/20/15  Yes Historical Provider, MD  ARMOUR THYROID 120 MG tablet Take 120 mg by mouth daily. 03/21/15  Yes Historical Provider, MD  cholecalciferol (VITAMIN D) 1000 UNITS tablet Take 5,000 Units by mouth every Monday, Wednesday, and Friday.    Yes Historical Provider, MD  Emollient (GOLD BOND MEDICATED BODY) 5-0.15 % LOTN Apply 1 application topically daily as needed (dry skin).   Yes Historical Provider, MD  metoprolol tartrate (LOPRESSOR) 25 MG tablet Take 1 tablet (25 mg total) by mouth 2 (two) times daily. 12/22/14  Yes Belkys A Regalado, MD  NUTRITIONAL SUPPLEMENTS PO Take 1 Can by mouth daily. MED PASS 2.0   Yes Historical Provider, MD  OVER THE COUNTER MEDICATION Take 30 mLs by mouth daily.   Yes Historical Provider, MD  Probiotic Product (PROBIOTIC DAILY PO) Take 1 tablet by mouth daily as needed (for digestion).   Yes Historical Provider, MD  benzonatate (TESSALON) 100 MG capsule Take 1 capsule (100 mg total) by mouth every 8 (eight) hours. 04/01/15   Varney Biles, MD  doxycycline (VIBRAMYCIN) 100 MG capsule Take 1 capsule (100 mg total) by mouth 2 (two) times daily. 04/01/15   Varney Biles, MD  feeding  supplement, ENSURE ENLIVE, (ENSURE ENLIVE) LIQD Take 237 mLs by mouth 2 (two) times daily between meals. Patient not taking: Reported on 04/01/2015 03/02/15   Florencia Reasons, MD  feeding supplement, RESOURCE BREEZE, (RESOURCE BREEZE) LIQD Take 1 Container by mouth daily at 3 pm. Patient not taking: Reported on 04/01/2015 03/02/15   Florencia Reasons, MD  hydrocortisone (ANUSOL-HC) 2.5 % rectal cream Place rectally 4 (four) times daily. Patient not taking: Reported on 04/01/2015 03/02/15   Florencia Reasons, MD  thyroid [facility] THYROID) 60 MG tablet Take 2 tablets (120 mg total) by mouth daily before breakfast. Patient not taking: Reported on 04/01/2015 03/02/15   Florencia Reasons, MD    BP 128/70 mmHg  Pulse 71  Temp(Src) 97.7 F (36.5 C) (Oral)  Resp 20  SpO2 85% Physical Exam  Constitutional: She is oriented to person, place, and time. She appears well-developed and well-nourished.  HENT:  Head: Normocephalic and atraumatic.  Eyes: EOM are normal. Pupils are equal, round, and reactive to light.  Neck: Neck supple.  Cardiovascular: Normal rate, regular rhythm and normal heart sounds.   No murmur heard. Pulmonary/Chest: Effort normal. No respiratory distress.  Abdominal: Soft. She exhibits no distension. There is no tenderness. There is no rebound and no guarding.  Neurological: She is alert and oriented to person, place, and time.  Skin: Skin is warm and dry. There is erythema.  LLE unilateral swelling with some erythema and warmth to touch. Rash has been demarcated.  Nursing note and vitals reviewed.   ED Course  Procedures (including critical care time) Labs Review Labs Reviewed  CBC WITH DIFFERENTIAL/PLATELET - Abnormal; Notable for the following:    RBC 2.93 (*)    Hemoglobin 9.3 (*)    HCT 28.2 (*)    RDW 15.8 (*)    All other components within normal limits  BASIC METABOLIC PANEL - Abnormal; Notable for the following:    Sodium 134 (*)    Potassium 3.3 (*)    Chloride 100 (*)    Creatinine, Ser 0.35 (*)    Calcium 8.0 (*)    All other components within normal limits  D-DIMER, QUANTITATIVE (NOT AT [facility]) - Abnormal; Notable for the following:    D-Dimer, Quant 1.18 (*)    All other components within normal limits    Imaging Review Dg Chest 2 View  04/01/2015   CLINICAL DATA:  Cough and congestion.  EXAM: CHEST  2 VIEW  COMPARISON:  02/20/2015  FINDINGS: Bilateral pleural effusions which are small, at least present since 02/21/2015 CT.  There is pulmonary venous congestion without overt edema. Chronic cardiomegaly and aortic tortuosity. Mitral annular calcification is noted.  There is a rounded density at the right base measuring 13 mm. Although  concerning for a nodule, this is likely costochondral based on density and location. This area is encompassed on abdominal CT 02/21/2015 and no nodule was seen.  Left axillary dissection and breast reconstruction.  IMPRESSION: Chronic small pleural effusions with atelectasis. Superimposed infection could be obscured.   Electronically Signed   By: Monte Fantasia M.D.   On: 04/01/2015 04:40     EKG Interpretation None       Dimer is elevated. [place] ordered for DVT r/o. Doxy for leg cellulitis- should help with lungs as well. MDM   Final diagnoses:  Cellulitis of left lower extremity    Pt with cough and LLE swelling with new redness and pain. CXR ordered for the cough. She is getting dimer to r/o DVT-  which is neg, she will be treated as cellulitis and will need close pcp f/u.   Varney Biles, MD 04/01/15 [phone]

## 2015-04-01 NOTE — Discharge Instructions (Signed)
You will need an ultrasound of the leg to ensure there are no blood clots. Our hospital will call you to schedule an appointment for that.  Also, we are giving you antibiotics that should help with the leg if it is from infection. See your doctor in 1 week.   Cellulitis Cellulitis is an infection of the skin and the tissue beneath it. The infected area is usually red and tender. Cellulitis occurs most often in the arms and lower legs.  CAUSES  Cellulitis is caused by bacteria that enter the skin through cracks or cuts in the skin. The most common types of bacteria that cause cellulitis are staphylococci and streptococci. SIGNS AND SYMPTOMS   Redness and warmth.  Swelling.  Tenderness or pain.  Fever. DIAGNOSIS  Your health care provider can usually determine what is wrong based on a physical exam. Blood tests may also be done. TREATMENT  Treatment usually involves taking an antibiotic medicine. HOME CARE INSTRUCTIONS   Take your antibiotic medicine as directed by your health care provider. Finish the antibiotic even if you start to feel better.  Keep the infected arm or leg elevated to reduce swelling.  Apply a warm cloth to the affected area up to 4 times per day to relieve pain.  Take medicines only as directed by your health care provider.  Keep all follow-up visits as directed by your health care provider. SEEK MEDICAL CARE IF:   You notice red streaks coming from the infected area.  Your red area gets larger or turns dark in color.  Your bone or joint underneath the infected area becomes painful after the skin has healed.  Your infection returns in the same area or another area.  You notice a swollen bump in the infected area.  You develop new symptoms.  You have a fever. SEEK IMMEDIATE MEDICAL CARE IF:   You feel very sleepy.  You develop vomiting or diarrhea.  You have a general ill feeling (malaise) with muscle aches and pains. MAKE SURE YOU:    Understand these instructions.  Will watch your condition.  Will get help right away if you are not doing well or get worse. Document Released: 06/18/2005 Document Revised: 01/23/2014 Document Reviewed: 11/24/2011 [facility] Patient Information 2015 [place], [place]. This information is not intended to replace advice given to you by your health care provider. Make sure you discuss any questions you have with your health care provider.

## 2015-05-04 ENCOUNTER — Emergency Department (HOSPITAL_BASED_OUTPATIENT_CLINIC_OR_DEPARTMENT_OTHER)
Admit: 2015-05-04 | Discharge: 2015-05-04 | Disposition: A | Discharge status: Home / Self Care | Payer: Medicare Other | Attending: Emergency Medicine | Admitting: Emergency Medicine

## 2015-05-04 ENCOUNTER — Emergency Department (HOSPITAL_COMMUNITY)
Admission: EM | Admit: 2015-05-04 | Discharge: 2015-05-04 | Disposition: A | Discharge status: Home / Self Care | Payer: Medicare Other | Attending: Emergency Medicine | Admitting: Emergency Medicine

## 2015-05-04 ENCOUNTER — Encounter (HOSPITAL_COMMUNITY): Payer: Self-pay | Admitting: *Deleted

## 2015-05-04 DIAGNOSIS — M1990 Unspecified osteoarthritis, unspecified site: Secondary | ICD-10-CM | POA: Diagnosis not present

## 2015-05-04 DIAGNOSIS — I341 Nonrheumatic mitral (valve) prolapse: Secondary | ICD-10-CM | POA: Insufficient documentation

## 2015-05-04 DIAGNOSIS — R2242 Localized swelling, mass and lump, left lower limb: Secondary | ICD-10-CM

## 2015-05-04 DIAGNOSIS — Z8719 Personal history of other diseases of the digestive system: Secondary | ICD-10-CM | POA: Diagnosis not present

## 2015-05-04 DIAGNOSIS — Z91040 Latex allergy status: Secondary | ICD-10-CM | POA: Insufficient documentation

## 2015-05-04 DIAGNOSIS — M810 Age-related osteoporosis without current pathological fracture: Secondary | ICD-10-CM | POA: Insufficient documentation

## 2015-05-04 DIAGNOSIS — Z853 Personal history of malignant neoplasm of breast: Secondary | ICD-10-CM | POA: Insufficient documentation

## 2015-05-04 DIAGNOSIS — Z87440 Personal history of urinary (tract) infections: Secondary | ICD-10-CM | POA: Insufficient documentation

## 2015-05-04 DIAGNOSIS — E079 Disorder of thyroid, unspecified: Secondary | ICD-10-CM | POA: Insufficient documentation

## 2015-05-04 DIAGNOSIS — I10 Essential (primary) hypertension: Secondary | ICD-10-CM | POA: Insufficient documentation

## 2015-05-04 DIAGNOSIS — L03116 Cellulitis of left lower limb: Secondary | ICD-10-CM | POA: Diagnosis not present

## 2015-05-04 DIAGNOSIS — Z79899 Other long term (current) drug therapy: Secondary | ICD-10-CM | POA: Diagnosis not present

## 2015-05-04 LAB — CBC WITH DIFFERENTIAL/PLATELET
Basophils Absolute: 0 10*3/uL (ref 0.0–0.1)
Basophils Relative: 0 % (ref 0–1)
EOS ABS: 0.1 10*3/uL (ref 0.0–0.7)
Eosinophils Relative: 1 % (ref 0–5)
HEMATOCRIT: 29.3 % — AB (ref 36.0–46.0)
HEMOGLOBIN: 9.7 g/dL — AB (ref 12.0–15.0)
LYMPHS ABS: 0.9 10*3/uL (ref 0.7–4.0)
Lymphocytes Relative: 17 % (ref 12–46)
MCH: 31.6 pg (ref 26.0–34.0)
MCHC: 33.1 g/dL (ref 30.0–36.0)
MCV: 95.4 fL (ref 78.0–100.0)
MONOS PCT: 11 % (ref 3–12)
Monocytes Absolute: 0.6 10*3/uL (ref 0.1–1.0)
NEUTROS ABS: 3.8 10*3/uL (ref 1.7–7.7)
Neutrophils Relative %: 71 % (ref 43–77)
Platelets: 176 10*3/uL (ref 150–400)
RBC: 3.07 MIL/uL — AB (ref 3.87–5.11)
RDW: 13.5 % (ref 11.5–15.5)
WBC: 5.4 10*3/uL (ref 4.0–10.5)

## 2015-05-04 LAB — BASIC METABOLIC PANEL
Anion gap: 5 (ref 5–15)
BUN: 21 mg/dL — ABNORMAL HIGH (ref 6–20)
CHLORIDE: 100 mmol/L — AB (ref 101–111)
CO2: 29 mmol/L (ref 22–32)
CREATININE: 0.51 mg/dL (ref 0.44–1.00)
Calcium: 8.3 mg/dL — ABNORMAL LOW (ref 8.9–10.3)
GFR calc Af Amer: >60 mL/min (ref >60)
GFR calc non Af Amer: >60 mL/min (ref >60)
Glucose, Bld: 102 mg/dL — ABNORMAL HIGH (ref 65–99)
POTASSIUM: 3.4 mmol/L — AB (ref 3.5–5.1)
Sodium: 134 mmol/L — ABNORMAL LOW (ref 135–145)

## 2015-05-04 MED ORDER — DOXYCYCLINE HYCLATE 100 MG PO CAPS: 100.0000 mg | ORAL_CAPSULE | Freq: Two times a day (BID) | ORAL | Status: DC

## 2015-05-04 MED ORDER — ACETAMINOPHEN 325 MG PO TABS
650.0000 mg | ORAL_TABLET | Freq: Once | ORAL | Status: AC
  Administered 2015-05-04: 650 mg via ORAL
  Filled 2015-05-04: qty 2

## 2015-05-04 NOTE — Progress Notes (Signed)
*  Preliminary Results* Left lower extremity venous duplex completed. Study was technically difficult due to edema. Visualized veins of the left lower extremity are negative for deep vein thrombosis. There is no evidence of left Baker's cyst.  05/04/2015 5:49 PM  Maudry Mayhew, RVT, RDCS, RDMS

## 2015-05-04 NOTE — ED Provider Notes (Signed)
CSN: [id number]     Arrival date & time 05/04/15  1439 History   First MD Initiated Contact with Patient 05/04/15 1504     Chief Complaint  Patient presents with  . Cellulitis     (Consider location/radiation/quality/duration/timing/severity/associated sxs/prior Treatment) HPI Comments: 81 year old female with history of GI bleeding, UTI, delirium, protein malnutrition, os arthritis, cellulitis presents with left leg swelling and erythema for which he feels is worsening the past few days. Patient had similar one month was treated with oral and bodyaches and feels improved. Patient was assisted living.  The history is provided by the patient and medical records.    Past Medical History  Diagnosis Date  . Cancer   . Breast cancer, left breast   . Osteoporosis   . Thyroid disease   . Hypertension   . MVP (mitral valve prolapse)   . Collagen vascular disease    Past Surgical History  Procedure Laterality Date  . Breast lumpectomy    . Hemrrhoidectomy    . Saline implant after left mastectomy    . Esophagogastroduodenoscopy N/A 12/19/2014    Procedure: ESOPHAGOGASTRODUODENOSCOPY (EGD);  Surgeon: Teena Irani, MD;  Location: [facility];  Service: Endoscopy;  Laterality: N/A;   Family History  Problem Relation Age of Onset  . Cancer Sister     breast    Social History  Substance Use Topics  . Smoking status: Never Smoker   . Smokeless tobacco: None  . Alcohol Use: No   OB History    No data available     Review of Systems    Allergies  Cephalexin; Linzess; Ultram; Latex; and Tape  Home Medications   Prior to Admission medications   Medication Sig Start Date End Date Taking? Authorizing Provider  acetaminophen (TYLENOL) 325 MG tablet Take 650 mg by mouth every 6 (six) hours as needed (for pain).   Yes Historical Provider, MD  albuterol (PROVENTIL) (2.5 MG/3ML) 0.083% nebulizer solution Take 2.5 mg by nebulization every 6 (six) hours as needed for wheezing or  shortness of breath.   Yes Historical Provider, MD  amoxicillin (AMOXIL) 500 MG capsule Take 2,000 mg by mouth daily as needed (prior to dental appointment). For dental use only 02/20/15  Yes Historical Provider, MD  bisacodyl (DULCOLAX) 10 MG suppository Place 10 mg rectally daily.   Yes Historical Provider, MD  cholecalciferol (VITAMIN D) 1000 UNITS tablet Take 5,000 Units by mouth every Monday, Wednesday, and Friday.    Yes Historical Provider, MD  dextromethorphan (DELSYM) 30 MG/5ML liquid Take 30 mg by mouth every 12 (twelve) hours as needed for cough.   Yes Historical Provider, MD  hydrocortisone (ANUSOL-HC) 2.5 % rectal cream Place rectally 4 (four) times daily. Patient taking differently: Place 1 application rectally 4 (four) times daily as needed for hemorrhoids.  03/02/15  Yes Florencia Reasons, MD  metoprolol tartrate (LOPRESSOR) 25 MG tablet Take 1 tablet (25 mg total) by mouth 2 (two) times daily. 12/22/14  Yes Belkys A Regalado, MD  Probiotic Product (PROBIOTIC DAILY PO) Take 1 tablet by mouth daily as needed (for digestion).   Yes Historical Provider, MD  sertraline (ZOLOFT) 50 MG tablet Take 50 mg by mouth daily with breakfast.   Yes Historical Provider, MD  thyroid (ARMOUR THYROID) 60 MG tablet Take 2 tablets (120 mg total) by mouth daily before breakfast. Patient taking differently: Take 60-120 mg by mouth daily before breakfast. Takes 120mg  everyday except 60mg  on Saturday's 03/02/15  Yes Florencia Reasons, MD  benzonatate [facility])  100 MG capsule Take 1 capsule (100 mg total) by mouth every 8 (eight) hours. Patient not taking: Reported on 05/04/2015 04/01/15   Varney Biles, MD  doxycycline (VIBRAMYCIN) 100 MG capsule Take 1 capsule (100 mg total) by mouth 2 (two) times daily. Patient not taking: Reported on 05/04/2015 04/01/15   Varney Biles, MD  doxycycline (VIBRAMYCIN) 100 MG capsule Take 1 capsule (100 mg total) by mouth 2 (two) times daily. One po bid x 7 days 05/04/15   Elnora Morrison, MD  feeding  supplement, ENSURE ENLIVE, (ENSURE ENLIVE) LIQD Take 237 mLs by mouth 2 (two) times daily between meals. Patient not taking: Reported on 04/01/2015 03/02/15   Florencia Reasons, MD  feeding supplement, RESOURCE BREEZE, (RESOURCE BREEZE) LIQD Take 1 Container by mouth daily at 3 pm. Patient not taking: Reported on 04/01/2015 03/02/15   Florencia Reasons, MD   BP 117/75 mmHg  Pulse 72  Temp(Src) 97.5 F (36.4 C) (Oral)  Resp 18  SpO2 96% Physical Exam  Constitutional: She is oriented to person, place, and time. She appears well-developed and well-nourished.  HENT:  Head: Normocephalic and atraumatic.  Eyes: Right eye exhibits no discharge. Left eye exhibits no discharge.  Neck: Normal range of motion. Neck supple. No tracheal deviation present.  Cardiovascular: Normal rate and regular rhythm.   Pulmonary/Chest: Effort normal and breath sounds normal.  Abdominal: Soft. She exhibits no distension. There is no tenderness. There is no guarding.  Musculoskeletal: She exhibits edema.  Neurological: She is alert and oriented to person, place, and time.  Skin: Skin is warm. Rash noted.  Patient has mild edema left lower extremity nontender, mild erythema without significant warmth left lateral tibia, neurovascular intact distal left leg  Psychiatric: She has a normal mood and affect.  Nursing note and vitals reviewed.   ED Course  Procedures (including critical care time) Labs Review Labs Reviewed  BASIC METABOLIC PANEL - Abnormal; Notable for the following:    Sodium 134 (*)    Potassium 3.4 (*)    Chloride 100 (*)    Glucose, Bld 102 (*)    BUN 21 (*)    Calcium 8.3 (*)    All other components within normal limits  CBC WITH DIFFERENTIAL/PLATELET - Abnormal; Notable for the following:    RBC 3.07 (*)    Hemoglobin 9.7 (*)    HCT 29.3 (*)    All other components within normal limits    Imaging Review No results found. I, Mariea Clonts, personally reviewed and evaluated these images and lab results  as part of my medical decision-making.   EKG Interpretation None      MDM   Final diagnoses:  Cellulitis of left lower extremity   Patient presented with left leg swelling and erythema, similar presentation one month prior. No current antibodies. Patient thinks she had a blood clot in the past unsure exactly why no current blood thinners. Plan for ultrasound left lower extremity to look for DVT. If negative plan for treatment with oral anabolic for cellulitis with outpatient follow-up. Patient well-appearing otherwise  Discussed with ultrasound tech, no DVT seen. Patient will follow-up outpatient with oral abx  Results and differential diagnosis were discussed with the patient/parent/guardian. Xrays were independently reviewed by myself.  Close follow up outpatient was discussed, comfortable with the plan.   Medications  acetaminophen (TYLENOL) tablet 650 mg (650 mg Oral Given 05/04/15 1749)    Filed Vitals:   05/04/15 1445  BP: 117/75  Pulse: 72  Temp:  97.5 F (36.4 C)  TempSrc: Oral  Resp: 18  SpO2: 96%    Final diagnoses:  Cellulitis of left lower extremity       Elnora Morrison, MD 05/04/15 1540

## 2015-05-04 NOTE — ED Notes (Signed)
Bed: JY11 Expected date:  Expected time:  Means of arrival:  Comments: 51f cellulitis

## 2015-05-04 NOTE — ED Notes (Addendum)
Patient was previously seen for cellulitis in July. Patient was treated and returned to assisted living. Patient was sent out today for evaluation. Patient denies pain and EMS was told that the doctor could not come back and re-evaluate so they sent her out.

## 2015-05-04 NOTE — Discharge Instructions (Signed)
If you were given medicines take as directed.  If you are on coumadin or contraceptives realize their levels and effectiveness is altered by many different medicines.  If you have any reaction (rash, tongues swelling, other) to the medicines stop taking and see a physician.    If your blood pressure was elevated in the ER make sure you follow up for management with a primary doctor or return for chest pain, shortness of breath or stroke symptoms.  Please follow up as directed and return to the ER or see a physician for new or worsening symptoms.  Thank you. Filed Vitals:   05/04/15 1445  BP: 117/75  Pulse: 72  Temp: 97.5 F (36.4 C)  TempSrc: Oral  Resp: 18  SpO2: 96%

## 2016-03-19 ENCOUNTER — Ambulatory Visit: Payer: Medicare Other | Admitting: Sports Medicine

## 2016-03-27 ENCOUNTER — Encounter: Payer: Self-pay | Admitting: Sports Medicine

## 2016-03-27 ENCOUNTER — Ambulatory Visit (INDEPENDENT_AMBULATORY_CARE_PROVIDER_SITE_OTHER): Payer: Medicare Other | Admitting: Sports Medicine

## 2016-03-27 VITALS — BP 127/80 | HR 102 | Ht 67.0 in | Wt 107.0 lb

## 2016-03-27 DIAGNOSIS — G5750 Tarsal tunnel syndrome, unspecified lower limb: Secondary | ICD-10-CM

## 2016-03-27 NOTE — Assessment & Plan Note (Signed)
This responded to insoles with arch support We used heel wedges to block the calcaneal valgus Her gait normalized after this  Today I placed a green sports insole with a three-quarter wedged lift and a first ray post in her walking shoes  In her thin loafers I cut down a special sports insole and placed medial heel wedges  Both of these felt comfortable after placement She is to return to see me as needed

## 2016-03-27 NOTE — Progress Notes (Signed)
Patient ID: [name], female   DOB: 1927/03/18, 82 y.o.   MRN: [id number]  Chief Complaint-needs shoe inserts to support her feet  Patient has had an abnormal gait and pronation for number of years In order to keep her walking we have placed her in sports insoles And these were supplemented with heel wedges She also had a wedged three-quarter length heel pad First ray post  Corrections the patient had been able to walk and stay active for a number of years.  History is unclear but in the last year or more she has had difficulties living alone She is now in an assisted living community on [address] Cause of falls and instability she is in a wheelchair  Hers insoles are worn out  She comes for me and insoles and evaluation  Physical examination Elderly female in no acute distress She is alert today but some her past history is unclear BP 127/80 mmHg  Pulse 102  Ht 5\' 7"  (1.702 m)  Wt 107 lb (48.535 kg)  BMI 16.75 kg/m2  Bilateral lower extremities show edema There is vascular insufficiency bilaterally Moderate swelling in both feet over the dorsum Loss of longitudinal arch and pronation with standing  Patient is able to put her on shoes on with difficulty Older insoles are badly worn

## 2016-06-17 ENCOUNTER — Encounter (HOSPITAL_COMMUNITY): Payer: Self-pay

## 2016-06-17 ENCOUNTER — Inpatient Hospital Stay (HOSPITAL_COMMUNITY): Payer: Medicare Other

## 2016-06-17 ENCOUNTER — Inpatient Hospital Stay (HOSPITAL_COMMUNITY)
Admission: EM | Admit: 2016-06-17 | Discharge: 2016-06-23 | DRG: 871 | Disposition: A | Discharge status: Home Health | Payer: Medicare Other | Attending: Internal Medicine | Admitting: Internal Medicine

## 2016-06-17 DIAGNOSIS — Z888 Allergy status to other drugs, medicaments and biological substances status: Secondary | ICD-10-CM | POA: Diagnosis not present

## 2016-06-17 DIAGNOSIS — A4151 Sepsis due to Escherichia coli [E. coli]: Secondary | ICD-10-CM | POA: Diagnosis present

## 2016-06-17 DIAGNOSIS — I480 Paroxysmal atrial fibrillation: Secondary | ICD-10-CM | POA: Diagnosis present

## 2016-06-17 DIAGNOSIS — M810 Age-related osteoporosis without current pathological fracture: Secondary | ICD-10-CM | POA: Diagnosis present

## 2016-06-17 DIAGNOSIS — Z66 Do not resuscitate: Secondary | ICD-10-CM | POA: Diagnosis present

## 2016-06-17 DIAGNOSIS — I4891 Unspecified atrial fibrillation: Secondary | ICD-10-CM | POA: Diagnosis not present

## 2016-06-17 DIAGNOSIS — J9601 Acute respiratory failure with hypoxia: Secondary | ICD-10-CM | POA: Diagnosis present

## 2016-06-17 DIAGNOSIS — F0390 Unspecified dementia without behavioral disturbance: Secondary | ICD-10-CM | POA: Diagnosis present

## 2016-06-17 DIAGNOSIS — A419 Sepsis, unspecified organism: Secondary | ICD-10-CM | POA: Diagnosis present

## 2016-06-17 DIAGNOSIS — Z853 Personal history of malignant neoplasm of breast: Secondary | ICD-10-CM | POA: Diagnosis not present

## 2016-06-17 DIAGNOSIS — I341 Nonrheumatic mitral (valve) prolapse: Secondary | ICD-10-CM | POA: Diagnosis present

## 2016-06-17 DIAGNOSIS — Z91040 Latex allergy status: Secondary | ICD-10-CM | POA: Diagnosis not present

## 2016-06-17 DIAGNOSIS — R531 Weakness: Secondary | ICD-10-CM | POA: Diagnosis present

## 2016-06-17 DIAGNOSIS — M19042 Primary osteoarthritis, left hand: Secondary | ICD-10-CM | POA: Diagnosis present

## 2016-06-17 DIAGNOSIS — M19041 Primary osteoarthritis, right hand: Secondary | ICD-10-CM | POA: Diagnosis present

## 2016-06-17 DIAGNOSIS — E039 Hypothyroidism, unspecified: Secondary | ICD-10-CM | POA: Diagnosis present

## 2016-06-17 DIAGNOSIS — R140 Abdominal distension (gaseous): Secondary | ICD-10-CM

## 2016-06-17 DIAGNOSIS — Z87891 Personal history of nicotine dependence: Secondary | ICD-10-CM | POA: Diagnosis not present

## 2016-06-17 DIAGNOSIS — I110 Hypertensive heart disease with heart failure: Secondary | ICD-10-CM | POA: Diagnosis present

## 2016-06-17 DIAGNOSIS — Z79899 Other long term (current) drug therapy: Secondary | ICD-10-CM

## 2016-06-17 DIAGNOSIS — Z809 Family history of malignant neoplasm, unspecified: Secondary | ICD-10-CM | POA: Diagnosis not present

## 2016-06-17 DIAGNOSIS — I10 Essential (primary) hypertension: Secondary | ICD-10-CM | POA: Diagnosis present

## 2016-06-17 DIAGNOSIS — M6281 Muscle weakness (generalized): Secondary | ICD-10-CM

## 2016-06-17 DIAGNOSIS — N390 Urinary tract infection, site not specified: Secondary | ICD-10-CM | POA: Diagnosis present

## 2016-06-17 DIAGNOSIS — E079 Disorder of thyroid, unspecified: Secondary | ICD-10-CM | POA: Diagnosis present

## 2016-06-17 DIAGNOSIS — R4182 Altered mental status, unspecified: Secondary | ICD-10-CM | POA: Diagnosis present

## 2016-06-17 DIAGNOSIS — I5031 Acute diastolic (congestive) heart failure: Secondary | ICD-10-CM | POA: Diagnosis present

## 2016-06-17 DIAGNOSIS — M170 Bilateral primary osteoarthritis of knee: Secondary | ICD-10-CM | POA: Diagnosis present

## 2016-06-17 HISTORY — DX: Unspecified atrial fibrillation: I48.91

## 2016-06-17 LAB — URINE MICROSCOPIC-ADD ON

## 2016-06-17 LAB — URINALYSIS, ROUTINE W REFLEX MICROSCOPIC
GLUCOSE, UA: NEGATIVE mg/dL
KETONES UR: NEGATIVE mg/dL
NITRITE: NEGATIVE
PH: 5.5 (ref 5.0–8.0)
Protein, ur: 100 mg/dL — AB
Specific Gravity, Urine: 1.019 (ref 1.005–1.030)

## 2016-06-17 LAB — BASIC METABOLIC PANEL
ANION GAP: 9 (ref 5–15)
BUN: 29 mg/dL — ABNORMAL HIGH (ref 6–20)
CO2: 22 mmol/L (ref 22–32)
Calcium: 8.8 mg/dL — ABNORMAL LOW (ref 8.9–10.3)
Chloride: 106 mmol/L (ref 101–111)
Creatinine, Ser: 0.74 mg/dL (ref 0.44–1.00)
GFR calc non Af Amer: >60 mL/min (ref >60)
Glucose, Bld: 115 mg/dL — ABNORMAL HIGH (ref 65–99)
Potassium: 4.2 mmol/L (ref 3.5–5.1)
Sodium: 137 mmol/L (ref 135–145)

## 2016-06-17 LAB — PROTIME-INR
INR: 1.25
INR: 1.27
Prothrombin Time: 15.8 seconds — ABNORMAL HIGH (ref 11.4–15.2)
Prothrombin Time: 16 seconds — ABNORMAL HIGH (ref 11.4–15.2)

## 2016-06-17 LAB — CBC
HCT: 36.4 % (ref 36.0–46.0)
Hemoglobin: 11.5 g/dL — ABNORMAL LOW (ref 12.0–15.0)
MCH: 30.8 pg (ref 26.0–34.0)
MCHC: 31.6 g/dL (ref 30.0–36.0)
MCV: 97.6 fL (ref 78.0–100.0)
PLATELETS: 159 10*3/uL (ref 150–400)
RBC: 3.73 MIL/uL — AB (ref 3.87–5.11)
RDW: 14.6 % (ref 11.5–15.5)
WBC: 9.4 10*3/uL (ref 4.0–10.5)

## 2016-06-17 LAB — I-STAT CG4 LACTIC ACID, ED: LACTIC ACID, VENOUS: 1.71 mmol/L (ref 0.5–1.9)

## 2016-06-17 LAB — GRAM STAIN: Special Requests: NORMAL

## 2016-06-17 LAB — T4, FREE: Free T4: 1.01 ng/dL (ref 0.61–1.12)

## 2016-06-17 LAB — PHOSPHORUS: PHOSPHORUS: 3.5 mg/dL (ref 2.5–4.6)

## 2016-06-17 LAB — APTT
aPTT: 36 seconds (ref 24–36)
aPTT: 34 seconds (ref 24–36)

## 2016-06-17 LAB — BRAIN NATRIURETIC PEPTIDE: B Natriuretic Peptide: 1612.7 pg/mL — ABNORMAL HIGH (ref 0.0–100.0)

## 2016-06-17 LAB — MAGNESIUM: Magnesium: 2.1 mg/dL (ref 1.7–2.4)

## 2016-06-17 LAB — TROPONIN I: Troponin I: 0.18 ng/mL (ref <0.03)

## 2016-06-17 LAB — TSH: TSH: 1.264 u[IU]/mL (ref 0.350–4.500)

## 2016-06-17 MED ORDER — ONDANSETRON HCL 4 MG/2ML IJ SOLN: 4.0000 mg | Freq: Four times a day (QID) | INTRAMUSCULAR | Status: DC | PRN

## 2016-06-17 MED ORDER — CEFEPIME HCL 2 G IJ SOLR
2.0000 g | Freq: Once | INTRAMUSCULAR | Status: AC
  Administered 2016-06-17: 2 g via INTRAVENOUS
  Filled 2016-06-17: qty 2

## 2016-06-17 MED ORDER — ACETAMINOPHEN 325 MG PO TABS
650.0000 mg | ORAL_TABLET | ORAL | Status: DC | PRN
  Filled 2016-06-17 (×2): qty 2

## 2016-06-17 MED ORDER — RISPERIDONE 1 MG/ML PO SOLN
1.0000 mg | Freq: Every day | ORAL | Status: DC
  Administered 2016-06-17 – 2016-06-20 (×4): 1 mg via ORAL
  Filled 2016-06-17 (×6): qty 1

## 2016-06-17 MED ORDER — SODIUM CHLORIDE 0.9 % IV BOLUS (SEPSIS)
30.0000 mL/kg | Freq: Once | INTRAVENOUS | Status: AC
  Administered 2016-06-17: 2013 mL via INTRAVENOUS

## 2016-06-17 MED ORDER — FUROSEMIDE 40 MG PO TABS: 60.0000 mg | ORAL_TABLET | Freq: Every day | ORAL | Status: DC

## 2016-06-17 MED ORDER — SIMETHICONE 40 MG/0.6ML PO SUSP
100.0000 mg | Freq: Two times a day (BID) | ORAL | Status: DC
  Administered 2016-06-17 – 2016-06-23 (×8): 100 mg via ORAL
  Filled 2016-06-17 (×12): qty 1.8

## 2016-06-17 MED ORDER — METOPROLOL TARTRATE 5 MG/5ML IV SOLN: 5.0000 mg | INTRAVENOUS | Status: DC

## 2016-06-17 MED ORDER — ENOXAPARIN SODIUM 30 MG/0.3ML ~~LOC~~ SOLN
30.0000 mg | SUBCUTANEOUS | Status: DC
  Administered 2016-06-17 – 2016-06-18 (×2): 30 mg via SUBCUTANEOUS
  Filled 2016-06-17 (×2): qty 0.3

## 2016-06-17 MED ORDER — FAMOTIDINE 20 MG PO TABS
20.0000 mg | ORAL_TABLET | Freq: Every day | ORAL | Status: DC
  Administered 2016-06-17 – 2016-06-20 (×4): 20 mg via ORAL
  Filled 2016-06-17 (×6): qty 1

## 2016-06-17 MED ORDER — SERTRALINE HCL 20 MG/ML PO CONC
150.0000 mg | Freq: Every day | ORAL | Status: DC
  Administered 2016-06-19 – 2016-06-22 (×4): 150 mg via ORAL
  Filled 2016-06-17 (×5): qty 7.5

## 2016-06-17 MED ORDER — METOPROLOL TARTRATE 5 MG/5ML IV SOLN
5.0000 mg | Freq: Once | INTRAVENOUS | Status: AC
  Administered 2016-06-17: 5 mg via INTRAVENOUS
  Filled 2016-06-17: qty 5

## 2016-06-17 MED ORDER — MIRABEGRON ER 25 MG PO TB24
50.0000 mg | ORAL_TABLET | Freq: Every day | ORAL | Status: DC
  Administered 2016-06-18 – 2016-06-23 (×6): 50 mg via ORAL
  Filled 2016-06-17 (×7): qty 2

## 2016-06-17 MED ORDER — LISINOPRIL 2.5 MG PO TABS: 2.5000 mg | ORAL_TABLET | Freq: Every day | ORAL | Status: DC

## 2016-06-17 MED ORDER — MELATONIN 3 MG PO TABS
3.0000 mg | ORAL_TABLET | Freq: Every day | ORAL | Status: DC
  Administered 2016-06-17 – 2016-06-20 (×3): 3 mg via ORAL
  Filled 2016-06-17 (×6): qty 1

## 2016-06-17 MED ORDER — THYROID 120 MG PO TABS
120.0000 mg | ORAL_TABLET | ORAL | Status: DC
  Administered 2016-06-18 – 2016-06-23 (×5): 120 mg via ORAL
  Filled 2016-06-17 (×5): qty 1

## 2016-06-17 MED ORDER — ASPIRIN 81 MG PO CHEW
324.0000 mg | CHEWABLE_TABLET | Freq: Once | ORAL | Status: AC
  Administered 2016-06-17: 324 mg via ORAL
  Filled 2016-06-17: qty 4

## 2016-06-17 MED ORDER — ASPIRIN EC 81 MG PO TBEC
81.0000 mg | DELAYED_RELEASE_TABLET | Freq: Every day | ORAL | Status: DC
  Administered 2016-06-18 – 2016-06-23 (×6): 81 mg via ORAL
  Filled 2016-06-17 (×8): qty 1

## 2016-06-17 MED ORDER — POLYETHYLENE GLYCOL 3350 17 G PO PACK
17.0000 g | PACK | Freq: Two times a day (BID) | ORAL | Status: DC
  Administered 2016-06-19 – 2016-06-22 (×4): 17 g via ORAL
  Filled 2016-06-17 (×7): qty 1

## 2016-06-17 MED ORDER — LEVOFLOXACIN IN D5W 750 MG/150ML IV SOLN
750.0000 mg | Freq: Once | INTRAVENOUS | Status: AC
  Administered 2016-06-17: 750 mg via INTRAVENOUS
  Filled 2016-06-17: qty 150

## 2016-06-17 MED ORDER — SODIUM CHLORIDE 0.9 % IV BOLUS (SEPSIS)
250.0000 mL | Freq: Once | INTRAVENOUS | Status: AC
  Administered 2016-06-17: 250 mL via INTRAVENOUS

## 2016-06-17 MED ORDER — THYROID 60 MG PO TABS
60.0000 mg | ORAL_TABLET | ORAL | Status: DC
  Administered 2016-06-21: 60 mg via ORAL
  Filled 2016-06-17: qty 1

## 2016-06-17 MED ORDER — AZTREONAM 2 G IJ SOLR
2.0000 g | Freq: Once | INTRAMUSCULAR | Status: AC
  Administered 2016-06-18: 2 g via INTRAVENOUS
  Filled 2016-06-17: qty 2

## 2016-06-17 MED ORDER — METOPROLOL TARTRATE 5 MG/5ML IV SOLN
2.5000 mg | INTRAVENOUS | Status: DC | PRN
  Administered 2016-06-18 – 2016-06-21 (×3): 2.5 mg via INTRAVENOUS
  Filled 2016-06-17 (×2): qty 5

## 2016-06-17 MED ORDER — METOPROLOL TARTRATE 25 MG PO TABS: 25.0000 mg | ORAL_TABLET | Freq: Two times a day (BID) | ORAL | Status: DC

## 2016-06-17 MED ORDER — THYROID 60 MG PO TABS: 60.0000 mg | ORAL_TABLET | Freq: Every day | ORAL | Status: DC

## 2016-06-17 NOTE — ED Notes (Signed)
Pt placed on 3L o2, due to stats in upper 80's

## 2016-06-17 NOTE — ED Provider Notes (Signed)
Level V caveat dementia Patient complains of mild shortness of breath. Otherwise asymptomatic. Patient is in no respiratory distress and appears comfortable. Speaks in paragraphs lungs with rales at bases HEENT exam no facial asymmetry neck supple heart irregularly irregular tachycardic abdomen nondistended nontender extremities with 1+ pretibial pitting edema bilaterally   Orlie Dakin, MD 06/18/16 [id number]

## 2016-06-17 NOTE — ED Triage Notes (Signed)
Pt from [facility] by EMS.  Pt reports SOB with activity but also recent refusal to take medications (Lasix and Lopressor).  Pt is in Afib upon arrival at 130 and smells of urine.

## 2016-06-17 NOTE — H&P (Addendum)
History and Physical    [name] [id number] DOB: April 01, 1927 DOA: 06/17/2016  PCP: Cari Caraway, MD Consultants:  None Patient coming from: Assisted Living?  Chief Complaint: SOB  HPI: [name] is a 82 y.o. female with medical history significant of HTN, hypothyroidism, breast CA (remote) presenting with SOB, urinary symptoms.  She is a poor historian and is unaccompanied.  She reports SOB yesterday.  Off and on for a week or more.  Lots of pain and anxiety.  Pain in abdomen.  Denies urinary symptoms.  Denies cough.    Per ER physician: Patient presents emergency department for assessment of shortness of breath and presents via EMS after there was concerns at nursing facility the patient has had a worsening of known CHF. Patient has been refusing doses of Lasix per documentation on MAR transfer sheet. Patient has no other symptomatic endorsements at this time except for foul-smelling urine with discomfort upon urination. Patient doses no falls no other track injuries denies chest pain or shortness of breath at this time. Denies fevers chills denies abdominal pain or back pain.  Per endorsements from care facility patient is having fatigue increased from baseline and his week that is described as generalized and not focal and causing patient to be unable to have the strength to ambulate which at her baseline includes walker and cane.    ED Course: Per Dr. Freda Munro: Patient presents emergency department in A. fib with RVR to 130s however presents without signs of cardiogenic shock or other perfusion impairment signed. Patient is afebrile per rectal exam has no other concerning vital signs and concern for sepsis at this time. Patient is not on anticoagulation and has known history of paroxysmal atrial fibrillation and has been seen by cardiology in the past discussion was had regarding no anticoagulation. Has documented metoprolol and Lasix in chart however per documentation from  nursing home patient is not been receiving Lasix and has been refusing doses.  Genitourinary endorsement's screening laboratory work along with coagulation profile and urine catheterized sample were obtained within an hour to look for sources of infection or RVR in setting of known atrial fibrillation. Upon my review of EKG EKG is similar to previous ECGs obtained I do not have concern or appreciation of acute STEMI at this time or other ischemic changes. Patient also has no chest pain and endorses no noticeable shortness breath.  Patient has no new oxygen requirement and worse nausea baseline. He has no lung findings concerning for pneumonia or COPD or asthma and does not have history of such.  I provided the patient with intravenous metoprolol as I do not believe patient presents with sepsis and do not have concern for decreasing patient's response. Given patient's diagnosis of CHF and previous use of metoprolol this was chosen over diltiazem.  Review of Systems: As per HPI; otherwise 10 point review of systems reviewed and negative.   Ambulatory Status:  Ambulates with walker?   Past Medical History:  Diagnosis Date  . Atrial fibrillation ([place])   . Breast cancer, left breast ([place])   . Cancer ([place])   . Collagen vascular disease ([place])   . Hypertension   . MVP (mitral valve prolapse)   . Osteoporosis   . Thyroid disease     Past Surgical History:  Procedure Laterality Date  . BREAST LUMPECTOMY    . ESOPHAGOGASTRODUODENOSCOPY N/A 12/19/2014   Procedure: ESOPHAGOGASTRODUODENOSCOPY (EGD);  Surgeon: Teena Irani, MD;  Location: [facility];  Service: Endoscopy;  Laterality: N/A;  .  hemrrhoidectomy    . saline implant after left mastectomy      Social History   Social History  . Marital status: Single    Spouse name: N/A  . Number of children: N/A  . Years of education: N/A   Occupational History  . Retired    Social History Main Topics  . Smoking status: Former Smoker     Types: Cigarettes  . Smokeless tobacco: Never Used  . Alcohol use No  . Drug use: No  . Sexual activity: No   Other Topics Concern  . Not on file   Social History Narrative   She lives alone.    Allergies  Allergen Reactions  . Cephalexin Nausea And Vomiting  . Linzess [Linaclotide] Other (See Comments)    Fatigue and muscle weakness  . Latex Rash    Skin rash with bandaids and tapes  . Tape Rash    Reaction to adhesive tape  . Ultram [Tramadol Hcl] Nausea Only    Family History  Problem Relation Age of Onset  . Cancer Sister     breast     Prior to Admission medications   Medication Sig Start Date End Date Taking? Authorizing Provider  acetaminophen (TYLENOL) 325 MG tablet Take 650 mg by mouth every 6 (six) hours as needed (for pain).    Historical Provider, MD  albuterol (PROVENTIL) (2.5 MG/3ML) 0.083% nebulizer solution Take 2.5 mg by nebulization every 6 (six) hours as needed for wheezing or shortness of breath.    Historical Provider, MD  amoxicillin (AMOXIL) 500 MG capsule Take 2,000 mg by mouth daily as needed (prior to dental appointment). For dental use only 02/20/15   Historical Provider, MD  benzonatate (TESSALON) 100 MG capsule Take 1 capsule (100 mg total) by mouth every 8 (eight) hours. Patient not taking: Reported on 05/04/2015 04/01/15   Varney Biles, MD  bisacodyl (DULCOLAX) 10 MG suppository Place 10 mg rectally daily.    Historical Provider, MD  cholecalciferol (VITAMIN D) 1000 UNITS tablet Take 5,000 Units by mouth every Monday, Wednesday, and Friday.     Historical Provider, MD  dextromethorphan (DELSYM) 30 MG/5ML liquid Take 30 mg by mouth every 12 (twelve) hours as needed for cough.    Historical Provider, MD  doxycycline (VIBRAMYCIN) 100 MG capsule Take 1 capsule (100 mg total) by mouth 2 (two) times daily. Patient not taking: Reported on 05/04/2015 04/01/15   Varney Biles, MD  doxycycline (VIBRAMYCIN) 100 MG capsule Take 1 capsule (100 mg total) by  mouth 2 (two) times daily. One po bid x 7 days 05/04/15   Elnora Morrison, MD  feeding supplement, ENSURE ENLIVE, (ENSURE ENLIVE) LIQD Take 237 mLs by mouth 2 (two) times daily between meals. Patient not taking: Reported on 04/01/2015 03/02/15   Florencia Reasons, MD  feeding supplement, RESOURCE BREEZE, (RESOURCE BREEZE) LIQD Take 1 Container by mouth daily at 3 pm. Patient not taking: Reported on 04/01/2015 03/02/15   Florencia Reasons, MD  hydrocortisone (ANUSOL-HC) 2.5 % rectal cream Place rectally 4 (four) times daily. Patient taking differently: Place 1 application rectally 4 (four) times daily as needed for hemorrhoids.  03/02/15   Florencia Reasons, MD  lubiprostone (AMITIZA) 24 MCG capsule  02/07/15   Historical Provider, MD  metoprolol tartrate (LOPRESSOR) 25 MG tablet Take 1 tablet (25 mg total) by mouth 2 (two) times daily. 12/22/14   Belkys A Regalado, MD  Probiotic Product (PROBIOTIC DAILY PO) Take 1 tablet by mouth daily as needed (for digestion).  Historical Provider, MD  sertraline (ZOLOFT) 50 MG tablet Take 50 mg by mouth daily with breakfast.    Historical Provider, MD  thyroid (ARMOUR THYROID) 60 MG tablet Take 2 tablets (120 mg total) by mouth daily before breakfast. Patient taking differently: Take 60-120 mg by mouth daily before breakfast. Takes 120mg  everyday except 60mg  on Saturday's 03/02/15   Florencia Reasons, MD    Physical Exam: Vitals:   06/17/16 2115 06/17/16 2210 06/17/16 2248 06/18/16 0135  BP: 149/93 141/81 134/73 (!) 117/91  Pulse: (!) 51 93 88 (!) 155  Resp: 22  20   Temp:   97.9 F (36.6 C)   TempSrc:   Oral   SpO2: 97% 96% 99%   Weight:   70 kg (154 lb 4.8 oz)   Height:   5\' 7"  (1.702 m)      General: Appears calm and comfortable and is NAD but anxious and cantankerous Eyes: PERRL, EOMI, normal lids, iris ENT:  grossly normal hearing, lips & tongue, mmm Neck:  no LAD, masses or thyromegaly Cardiovascular:  RRR, no m/r/g. No LE edema.  Respiratory:  CTA bilaterally, no w/r/r. Normal  respiratory effort. Abdomen:  soft, mild suprapubic TTP, nd, NABS Skin:  no rash or induration seen on limited exam Musculoskeletal:  grossly normal tone BUE/BLE, good ROM, no bony abnormality Psychiatric:  grossly normal mood and affect, speech fluent and appropriate, AOx3 Neurologic:  CN 2-12 grossly intact, moves all extremities in coordinated fashion, sensation intact  Labs on Admission: I have personally reviewed following labs and imaging studies  CBC:  Recent Labs Lab 06/17/16 1637  WBC 9.4  HGB 11.5*  HCT 36.4  MCV 97.6  PLT [id number]   Basic Metabolic Panel:  Recent Labs Lab 06/17/16 1637  NA 137  K 4.2  CL 106  CO2 22  GLUCOSE 115*  BUN 29*  CREATININE 0.74  CALCIUM 8.8*  MG 2.1  PHOS 3.5   GFR: Estimated Creatinine Clearance: 48.2 mL/min (by C-G formula based on SCr of 0.74 mg/dL). Liver Function Tests: No results for input(s): AST, ALT, ALKPHOS, BILITOT, PROT, ALBUMIN in the last 168 hours. No results for input(s): LIPASE, AMYLASE in the last 168 hours. No results for input(s): AMMONIA in the last 168 hours. Coagulation Profile:  Recent Labs Lab 06/17/16 1637 06/17/16 2322  INR 1.25 1.27   Cardiac Enzymes:  Recent Labs Lab 06/17/16 1637 06/17/16 2322  TROPONINI 0.18* 0.17*   BNP (last 3 results) No results for input(s): PROBNP in the last 8760 hours. HbA1C: No results for input(s): HGBA1C in the last 72 hours. CBG: No results for input(s): GLUCAP in the last 168 hours. Lipid Profile: No results for input(s): CHOL, HDL, LDLCALC, TRIG, CHOLHDL, LDLDIRECT in the last 72 hours. Thyroid Function Tests:  Recent Labs  06/17/16 1637  TSH 1.264  FREET4 1.01   Anemia Panel: No results for input(s): VITAMINB12, FOLATE, FERRITIN, TIBC, IRON, RETICCTPCT in the last 72 hours. Urine analysis:    Component Value Date/Time   COLORURINE AMBER (A) 06/17/2016 1657   APPEARANCEUR CLOUDY (A) 06/17/2016 1657   LABSPEC 1.019 06/17/2016 1657   PHURINE  5.5 06/17/2016 1657   GLUCOSEU NEGATIVE 06/17/2016 1657   HGBUR LARGE (A) 06/17/2016 1657   BILIRUBINUR SMALL (A) 06/17/2016 1657   BILIRUBINUR neg 12/05/2011 1147   KETONESUR NEGATIVE 06/17/2016 1657   PROTEINUR 100 (A) 06/17/2016 1657   UROBILINOGEN 0.2 02/17/2015 1508   NITRITE NEGATIVE 06/17/2016 1657   LEUKOCYTESUR LARGE (A) 06/17/2016 1657  Creatinine Clearance: Estimated Creatinine Clearance: 48.2 mL/min (by C-G formula based on SCr of 0.74 mg/dL).  Sepsis Labs: @LABRCNTIP (procalcitonin:4,lacticidven:4) ) Recent Results (from the past 240 hour(s))  Gram stain     Status: None   Collection Time: 06/17/16  4:57 PM  Result Value Ref Range Status   Specimen Description IN/OUT CATH URINE  Final   Special Requests Normal  Final   Gram Stain   Final    WBC PRESENT, PREDOMINANTLY PMN GRAM VARIABLE ROD CYTOSPIN SMEAR    Report Status 06/17/2016 FINAL  Final  Urine culture     Status: None (Preliminary result)   Collection Time: 06/17/16  4:57 PM  Result Value Ref Range Status   Specimen Description IN/OUT CATH URINE  Final   Special Requests Normal  Final   Culture PENDING  Incomplete   Report Status PENDING  Incomplete     Radiological Exams on Admission: Dg Chest 2 View  Result Date: 06/17/2016 CLINICAL DATA:  Shortness of breath and hypertension EXAM: CHEST  2 VIEW COMPARISON:  Chest radiograph 04/01/2015 FINDINGS: There is shallow lung inflation. There is atherosclerotic calcification within the thoracic aorta. There are bilateral small pleural effusions, right greater than left. There is central pulmonary vascular congestion and streaky right basilar opacities. No pneumothorax. Left axillary clips are noted. Lower thoracic compression fracture is again seen. IMPRESSION: 1. Small chronic bilateral pleural effusions, right greater left. 2. Shallow lung inflation with possible mild pulmonary edema. 3. Bibasilar atelectasis, right greater than left. 4. Aortic  atherosclerosis. Electronically Signed   By: Ulyses Jarred M.D.   On: 06/17/2016 20:23    EKG: Independently reviewed.  Afib with rate 133; NSCSLT  Assessment/Plan Principal Problem:   Atrial fibrillation with rapid ventricular response, paroxysmal Active Problems:   Weakness generalized   Hypothyroid   Altered mental status   UTI (lower urinary tract infection)   Essential hypertension   Afib with RVR -Responded to IV lopressor given in ER -At the time of my evaluation, the patient's HR remained <95 throughout and so admission to telemetry floor was reasonable -IV Lopressor continued prn -Will attempt to avoid Diltiazem due to possible mild CHF exacerbation (c/o SOB, marked elevation in BNP without prior for comparison) -Will continue home Lasix but with mild concern for sepsis will not give IV Lasix at this time unless patient shows more evidence of CHF clinically -Does not appear to be taking anticoagulant, including ASA, despite CHA2DS2-VASc Score of 5, indicating a 6.7% adjusted stroke rate/year.  UTI -Rectal temp 100.7, tachycardia (but with RVR) - this may be indicative of sepsis physiology, but normal lactate and BP -For now, will continue to treat UTI (large LE, vague but positive symptoms) -Was given Cefepime in ER, but order set recommends Levaquin and Aztreonam so will change to these medications for now  AMS -Suspect mild dementia -Also with apparent underlying psychiatric disturbance as the patient takes Risperdal, Zoloft, and Xanax at home  Hypothyroidism -Normal TSH/free T4 -Patient takes Armour Thyroid which is no longer recommended -However, it seems to be working so no changes at this time  HTN -Continue Lisinopril -Hold Lopressor PO for now since giving it IV  DVT prophylaxis: Lovenox  Code Status: DNR - based on prior code status Family Communication: None available  Disposition Plan: Back to nursing facility once clinically improved Consults called:  None  Admission status: Admit to telemetry    Karmen Bongo MD [facility]  If 7PM-7AM, please contact night-coverage [email or web address] Password [email or web address]  06/18/2016, 2:00 AM

## 2016-06-17 NOTE — ED Notes (Signed)
RN spoke with pt sister Lelon Frohlich [facility]) who reports that pt has had worsening dementia with paranoia over the past several weeks.  POA reports that pt has been refusing medications because she believes that the workers at [employer] are poisoning her.  POA also reports that pt does not wish to speak with her due to sister placing pt in nursing home aprox. One year ago.  Sister still maintains POA over pt and wishes to be contacted with updates.  MD made aware of new information.

## 2016-06-17 NOTE — ED Notes (Signed)
Admitting at bedside 

## 2016-06-17 NOTE — ED Notes (Signed)
Pt attempted to get up from bed. States that she does not like it here. Attempts to reorient pt unsuccessful. Tech at bedside

## 2016-06-18 ENCOUNTER — Encounter (HOSPITAL_COMMUNITY): Payer: Self-pay

## 2016-06-18 ENCOUNTER — Inpatient Hospital Stay (HOSPITAL_COMMUNITY): Payer: Medicare Other

## 2016-06-18 DIAGNOSIS — I10 Essential (primary) hypertension: Secondary | ICD-10-CM | POA: Diagnosis present

## 2016-06-18 DIAGNOSIS — I4891 Unspecified atrial fibrillation: Secondary | ICD-10-CM

## 2016-06-18 LAB — MRSA PCR SCREENING: MRSA BY PCR: NEGATIVE

## 2016-06-18 LAB — BLOOD CULTURE ID PANEL (REFLEXED)
ACINETOBACTER BAUMANNII: NOT DETECTED
CANDIDA ALBICANS: NOT DETECTED
CANDIDA GLABRATA: NOT DETECTED
Candida krusei: NOT DETECTED
Candida parapsilosis: NOT DETECTED
Candida tropicalis: NOT DETECTED
Carbapenem resistance: NOT DETECTED
ENTEROBACTER CLOACAE COMPLEX: NOT DETECTED
ENTEROCOCCUS SPECIES: NOT DETECTED
ESCHERICHIA COLI: DETECTED — AB
Enterobacteriaceae species: DETECTED — AB
Haemophilus influenzae: NOT DETECTED
KLEBSIELLA OXYTOCA: NOT DETECTED
Klebsiella pneumoniae: NOT DETECTED
LISTERIA MONOCYTOGENES: NOT DETECTED
NEISSERIA MENINGITIDIS: NOT DETECTED
PROTEUS SPECIES: NOT DETECTED
Pseudomonas aeruginosa: NOT DETECTED
STREPTOCOCCUS AGALACTIAE: NOT DETECTED
STREPTOCOCCUS PNEUMONIAE: NOT DETECTED
Serratia marcescens: NOT DETECTED
Staphylococcus aureus (BCID): NOT DETECTED
Staphylococcus species: NOT DETECTED
Streptococcus pyogenes: NOT DETECTED
Streptococcus species: NOT DETECTED

## 2016-06-18 LAB — TROPONIN I
TROPONIN I: 0.25 ng/mL — AB (ref <0.03)
Troponin I: 0.17 ng/mL (ref <0.03)
Troponin I: 0.2 ng/mL (ref <0.03)

## 2016-06-18 LAB — BASIC METABOLIC PANEL
Anion gap: 11 (ref 5–15)
BUN: 30 mg/dL — ABNORMAL HIGH (ref 6–20)
CALCIUM: 8.6 mg/dL — AB (ref 8.9–10.3)
CO2: 22 mmol/L (ref 22–32)
Chloride: 105 mmol/L (ref 101–111)
Creatinine, Ser: 0.65 mg/dL (ref 0.44–1.00)
GFR calc non Af Amer: >60 mL/min (ref >60)
Glucose, Bld: 128 mg/dL — ABNORMAL HIGH (ref 65–99)
POTASSIUM: 4.1 mmol/L (ref 3.5–5.1)
Sodium: 138 mmol/L (ref 135–145)

## 2016-06-18 LAB — CBC
HEMATOCRIT: 32.9 % — AB (ref 36.0–46.0)
HEMOGLOBIN: 10.3 g/dL — AB (ref 12.0–15.0)
MCH: 30.7 pg (ref 26.0–34.0)
MCHC: 31.3 g/dL (ref 30.0–36.0)
MCV: 97.9 fL (ref 78.0–100.0)
Platelets: 164 10*3/uL (ref 150–400)
RBC: 3.36 MIL/uL — AB (ref 3.87–5.11)
RDW: 14.5 % (ref 11.5–15.5)
WBC: 9.3 10*3/uL (ref 4.0–10.5)

## 2016-06-18 LAB — LACTIC ACID, PLASMA
Lactic Acid, Venous: 1.6 mmol/L (ref 0.5–1.9)
Lactic Acid, Venous: 1.8 mmol/L (ref 0.5–1.9)

## 2016-06-18 LAB — PROCALCITONIN: PROCALCITONIN: 0.44 ng/mL

## 2016-06-18 MED ORDER — LORAZEPAM 0.5 MG PO TABS
0.2500 mg | ORAL_TABLET | Freq: Once | ORAL | Status: AC
  Administered 2016-06-18: 0.25 mg via ORAL
  Filled 2016-06-18: qty 1

## 2016-06-18 MED ORDER — LEVOFLOXACIN IN D5W 750 MG/150ML IV SOLN: 750.0000 mg | INTRAVENOUS | Status: DC

## 2016-06-18 MED ORDER — AZTREONAM 1 G IJ SOLR
1.0000 g | Freq: Three times a day (TID) | INTRAMUSCULAR | Status: DC
  Filled 2016-06-18 (×3): qty 1

## 2016-06-18 MED ORDER — METOPROLOL TARTRATE 25 MG PO TABS
25.0000 mg | ORAL_TABLET | Freq: Two times a day (BID) | ORAL | Status: DC
  Administered 2016-06-18 – 2016-06-21 (×7): 25 mg via ORAL
  Filled 2016-06-18 (×7): qty 1

## 2016-06-18 MED ORDER — DEXTROSE 5 % IV SOLN
2.0000 g | INTRAVENOUS | Status: DC
  Administered 2016-06-18 – 2016-06-22 (×5): 2 g via INTRAVENOUS
  Filled 2016-06-18 (×6): qty 2

## 2016-06-18 MED ORDER — MORPHINE SULFATE (PF) 2 MG/ML IV SOLN
2.0000 mg | Freq: Once | INTRAVENOUS | Status: AC
Start: 2016-06-18 — End: 2016-06-18
  Administered 2016-06-18: 2 mg via INTRAVENOUS
  Filled 2016-06-18: qty 1

## 2016-06-18 NOTE — H&P (Signed)
CSW spoke with Carmine Savoy patient's HPOA. She reported patient is a DNR. Patient will need DNR to go back to ALF.   [employer], LCSW [phone]

## 2016-06-18 NOTE — Progress Notes (Signed)
PHARMACY - PHYSICIAN COMMUNICATION CRITICAL VALUE ALERT - BLOOD CULTURE IDENTIFICATION (BCID)  Results for orders placed or performed during the hospital encounter of 06/17/16  Blood Culture ID Panel (Reflexed) (Collected: 06/17/2016  4:41 PM)  Result Value Ref Range   Enterococcus species NOT DETECTED NOT DETECTED   Listeria monocytogenes NOT DETECTED NOT DETECTED   Staphylococcus species NOT DETECTED NOT DETECTED   Staphylococcus aureus NOT DETECTED NOT DETECTED   Streptococcus species NOT DETECTED NOT DETECTED   Streptococcus agalactiae NOT DETECTED NOT DETECTED   Streptococcus pneumoniae NOT DETECTED NOT DETECTED   Streptococcus pyogenes NOT DETECTED NOT DETECTED   Acinetobacter baumannii NOT DETECTED NOT DETECTED   Enterobacteriaceae species DETECTED (A) NOT DETECTED   Enterobacter cloacae complex NOT DETECTED NOT DETECTED   Escherichia coli DETECTED (A) NOT DETECTED   Klebsiella oxytoca NOT DETECTED NOT DETECTED   Klebsiella pneumoniae NOT DETECTED NOT DETECTED   Proteus species NOT DETECTED NOT DETECTED   Serratia marcescens NOT DETECTED NOT DETECTED   Carbapenem resistance NOT DETECTED NOT DETECTED   Haemophilus influenzae NOT DETECTED NOT DETECTED   Neisseria meningitidis NOT DETECTED NOT DETECTED   Pseudomonas aeruginosa NOT DETECTED NOT DETECTED   Candida albicans NOT DETECTED NOT DETECTED   Candida glabrata NOT DETECTED NOT DETECTED   Candida krusei NOT DETECTED NOT DETECTED   Candida parapsilosis NOT DETECTED NOT DETECTED   Candida tropicalis NOT DETECTED NOT DETECTED   82 year old female admitted with UTI.  She now has gram negative bacteremia with E coli detected on BCID. She reports a Cephalexin intolerance with reaction of nausea and vomiting. She tolerated Ceftriaxone in May 2016 and received a dose of Cefepime in the ED this admission.    Name of physician (or Provider) Contacted: Dr. Tyrell Antonio  Changes to prescribed antibiotics required: Stop Levaquin and  Aztreonam. Start Ceftriaxone 2g IV q24h.  Norva Riffle 06/18/2016  9:28 AM

## 2016-06-18 NOTE — Progress Notes (Signed)
Pt experienced abdominal pain rating 10/10. During the episode of pain pt's heart rate went up to 160's. Bp and O2 stable at that time. Gave PRN's to lower heart rate and abdominal pain started to relieve. MD aware. Will continue to follow.

## 2016-06-18 NOTE — Progress Notes (Signed)
Pharmacy Antibiotic Note  [name] is a 82 y.o. female admitted on 06/17/2016 with UTI.  Pharmacy has been consulted for Levaquin/Aztreonam dosing. WBC WNL. Renal function age appropriate. Abnormal U/A.   Plan: -Levaquin 750 mg IV q48h -Aztreonam 1g IV q8h -Trend WBC, temp, renal function  -Can likely de-escalate quickly to monotherapy  Height: 5\' 7"  (170.2 cm) Weight: 154 lb 4.8 oz (70 kg) IBW/kg (Calculated) : 61.6  Temp (24hrs), Avg:98.9 F (37.2 C), Min:97.9 F (36.6 C), Max:100.7 F (38.2 C)   Recent Labs Lab 06/17/16 1637 06/17/16 1714 06/17/16 2322  WBC 9.4  --   --   CREATININE 0.74  --   --   LATICACIDVEN  --  1.71 1.6    Estimated Creatinine Clearance: 48.2 mL/min (by C-G formula based on SCr of 0.74 mg/dL).    Allergies  Allergen Reactions  . Cephalexin Nausea And Vomiting  . Linzess [Linaclotide] Other (See Comments)    Fatigue and muscle weakness  . Latex Rash    Skin rash with bandaids and tapes  . Tape Rash    Reaction to adhesive tape  . Ultram [Tramadol Hcl] Nausea Only    Narda Bonds 06/18/2016 12:38 AM

## 2016-06-18 NOTE — Progress Notes (Signed)
PROGRESS NOTE    [name]  [id number] DOB: [birth year] DOA: 06/17/2016 PCP: Cari Caraway, MD    Brief Narrative:  [name] is a 82 y.o. female with medical history significant of HTN, hypothyroidism, breast CA (remote) presenting with SOB, urinary symptoms.  She is a poor historian and is unaccompanied.  She reports SOB yesterday.  Off and on for a week or more.  Lots of pain and anxiety.  Pain in abdomen.  Denies urinary symptoms.  Denies cough.    Per ER physician: Patient presents emergency department for assessment of shortness of breath and presents via EMS after there was concerns at nursing facility the patient has had a worsening of known CHF. Patient has been refusing doses of Lasix per documentation on MARtransfer sheet. Patient has no other symptomatic endorsements at this time except for foul-smelling urine with discomfort upon urination. Patient doses no falls no other track injuries denies chest pain or shortness of breath at this time. Denies fevers chills denies abdominal pain or back pain.  Per endorsements from care facility patient is having fatigue increased from baseline and his week that is described as generalized and not focal and causing patient to be unable to have the strength to ambulate which at her baseline includes walker and cane.   ED Course: Per Dr. Freda Munro: Patient presents emergency department in A. fib with RVR to 130s however presents without signs of cardiogenic shock or other perfusion impairment signed. Patient is afebrile per rectal exam has no other concerning vital signs and concern for sepsis at this time. Patient is not on anticoagulation and has known history of paroxysmal atrial fibrillation and has been seen by cardiology in the past discussion was had regarding no anticoagulation. Has documented metoprolol and Lasix in chart however per documentation from nursing home patient is not been receiving Lasix and has been refusing  doses.  Genitourinary endorsement's screening laboratory work along with coagulation profile and urine catheterized sample were obtained within an hour to look for sources of infection or RVR in setting of known atrial fibrillation.    Assessment & Plan:   Principal Problem:   Atrial fibrillation with rapid ventricular response, paroxysmal Active Problems:   Weakness generalized   Hypothyroid   Altered mental status   UTI (lower urinary tract infection)   Essential hypertension     Afib with RVR -Responded to IV lopressor given in ER -IV Lopressor continued prn -Does not appear to be taking anticoagulant, including ASA -resume home dose of metoprolol.   Sepsis; presents with fever, tachycardia, tachypnea, source UTI. Now also with Bacteremia.  Hold lasix.  Continue with IV antibiotics, ceftriaxone.  Careful with IV fluids, chest x ray with mild pulmonary edema. Resume lasix in am if BP stable.    UTI, Bacteremia  -E coli bacteremia, antibiotics change to ceftriaxone, which patient has tolerated in the past.   Abdominal distension;  Check KUB. Colonic ielum.  Had BM last night   AMS -Suspect mild dementia -Also with apparent underlying psychiatric disturbance as the patient takes Risperdal, Zoloft, and Xanax at home -confuse this morning.   Hypothyroidism -Normal TSH/free T4 -Patient takes [employer] Thyroid which is no longer recommended -However, it seems to be working so no changes at this time  HTN -hold  Lisinopril     DVT prophylaxis: Lovenox Code Status: DNR Family Communication: none at bedside.  Disposition Plan: Remain inpatient.   Consultants:   none   Procedures: none   Antimicrobials: ceftriaxone.  Subjective: Patient is confuse, repeating herself.  Agitated at times.   Objective: Vitals:   06/17/16 2115 06/17/16 2210 06/17/16 2248 06/18/16 0135  BP: 149/93 141/81 134/73 (!) 117/91  Pulse: (!) 51 93 88 (!) 155  Resp: 22  20    Temp:   97.9 F (36.6 C)   TempSrc:   Oral   SpO2: 97% 96% 99%   Weight:   70 kg (154 lb 4.8 oz)   Height:   5\' 7"  (1.702 m)     Intake/Output Summary (Last 24 hours) at 06/18/16 1018 Last data filed at 06/18/16 0506  Gross per 24 hour  Intake              950 ml  Output                0 ml  Net              950 ml   Filed Weights   06/17/16 1604 06/17/16 2248  Weight: 67.1 kg (148 lb) 70 kg (154 lb 4.8 oz)    Examination:  General exam: Appears calm and comfortable  Respiratory system: Clear to auscultation. Respiratory effort normal. Cardiovascular system: S1 & S2 heard, RRR. No JVD, murmurs, rubs, gallops or clicks. No pedal edema. Gastrointestinal system: Abdomen is nondistended, soft and nontender. No organomegaly or masses felt. Normal bowel sounds heard. Central nervous system: Alert and oriented. No focal neurological deficits. Extremities: Symmetric 5 x 5 power. Skin: No rashes, lesions or ulcers     Data Reviewed: I have personally reviewed following labs and imaging studies  CBC:  Recent Labs Lab 06/17/16 1637 06/18/16 0510  WBC 9.4 9.3  HGB 11.5* 10.3*  HCT 36.4 32.9*  MCV 97.6 97.9  PLT 159 [id number]   Basic Metabolic Panel:  Recent Labs Lab 06/17/16 1637 06/18/16 0510  NA 137 138  K 4.2 4.1  CL 106 105  CO2 22 22  GLUCOSE 115* 128*  BUN 29* 30*  CREATININE 0.74 0.65  CALCIUM 8.8* 8.6*  MG 2.1  --   PHOS 3.5  --    GFR: Estimated Creatinine Clearance: 48.2 mL/min (by C-G formula based on SCr of 0.65 mg/dL). Liver Function Tests: No results for input(s): AST, ALT, ALKPHOS, BILITOT, PROT, ALBUMIN in the last 168 hours. No results for input(s): LIPASE, AMYLASE in the last 168 hours. No results for input(s): AMMONIA in the last 168 hours. Coagulation Profile:  Recent Labs Lab 06/17/16 1637 06/17/16 2322  INR 1.25 1.27   Cardiac Enzymes:  Recent Labs Lab 06/17/16 1637 06/17/16 2322 06/18/16 0510  TROPONINI 0.18* 0.17* 0.25*    BNP (last 3 results) No results for input(s): PROBNP in the last 8760 hours. HbA1C: No results for input(s): HGBA1C in the last 72 hours. CBG: No results for input(s): GLUCAP in the last 168 hours. Lipid Profile: No results for input(s): CHOL, HDL, LDLCALC, TRIG, CHOLHDL, LDLDIRECT in the last 72 hours. Thyroid Function Tests:  Recent Labs  06/17/16 1637  TSH 1.264  FREET4 1.01   Anemia Panel: No results for input(s): VITAMINB12, FOLATE, FERRITIN, TIBC, IRON, RETICCTPCT in the last 72 hours. Sepsis Labs:  Recent Labs Lab 06/17/16 1714 06/17/16 2322 06/18/16 0211  PROCALCITON  --  0.44  --   LATICACIDVEN 1.71 1.6 1.8    Recent Results (from the past 240 hour(s))  Blood Culture (routine x 2)     Status: None (Preliminary result)   Collection Time: 06/17/16  4:41 PM  Result Value Ref Range Status   Specimen Description BLOOD RIGHT ANTECUBITAL  Final   Special Requests BOTTLES DRAWN AEROBIC AND ANAEROBIC 5CC  Final   Culture  Setup Time   Final    GRAM NEGATIVE RODS ANAEROBIC BOTTLE ONLY CRITICAL RESULT CALLED TO, READ BACK BY AND VERIFIED WITH: Leonie Green PHARMD, AT [phone] 06/18/16 BY D.VANHOOK    Culture GRAM NEGATIVE RODS  Final   Report Status PENDING  Incomplete  Blood Culture ID Panel (Reflexed)     Status: Abnormal   Collection Time: 06/17/16  4:41 PM  Result Value Ref Range Status   Enterococcus species NOT DETECTED NOT DETECTED Final   Listeria monocytogenes NOT DETECTED NOT DETECTED Final   Staphylococcus species NOT DETECTED NOT DETECTED Final   Staphylococcus aureus NOT DETECTED NOT DETECTED Final   Streptococcus species NOT DETECTED NOT DETECTED Final   Streptococcus agalactiae NOT DETECTED NOT DETECTED Final   Streptococcus pneumoniae NOT DETECTED NOT DETECTED Final   Streptococcus pyogenes NOT DETECTED NOT DETECTED Final   Acinetobacter baumannii NOT DETECTED NOT DETECTED Final   Enterobacteriaceae species DETECTED (A) NOT DETECTED Final    Comment:  CRITICAL RESULT CALLED TO, READ BACK BY AND VERIFIED WITH: Leonie Green PHARMD, AT [phone] 06/18/16 BY D.VANHOOK    Enterobacter cloacae complex NOT DETECTED NOT DETECTED Final   Escherichia coli DETECTED (A) NOT DETECTED Final    Comment: CRITICAL RESULT CALLED TO, READ BACK BY AND VERIFIED WITH: Leonie Green PHARMD, AT [phone] 06/18/16 BY D.VANHOOK    Klebsiella oxytoca NOT DETECTED NOT DETECTED Final   Klebsiella pneumoniae NOT DETECTED NOT DETECTED Final   Proteus species NOT DETECTED NOT DETECTED Final   Serratia marcescens NOT DETECTED NOT DETECTED Final   Carbapenem resistance NOT DETECTED NOT DETECTED Final   Haemophilus influenzae NOT DETECTED NOT DETECTED Final   Neisseria meningitidis NOT DETECTED NOT DETECTED Final   Pseudomonas aeruginosa NOT DETECTED NOT DETECTED Final   Candida albicans NOT DETECTED NOT DETECTED Final   Candida glabrata NOT DETECTED NOT DETECTED Final   Candida krusei NOT DETECTED NOT DETECTED Final   Candida parapsilosis NOT DETECTED NOT DETECTED Final   Candida tropicalis NOT DETECTED NOT DETECTED Final  Gram stain     Status: None   Collection Time: 06/17/16  4:57 PM  Result Value Ref Range Status   Specimen Description IN/OUT CATH URINE  Final   Special Requests Normal  Final   Gram Stain   Final    WBC PRESENT, PREDOMINANTLY PMN GRAM VARIABLE ROD CYTOSPIN SMEAR    Report Status 06/17/2016 FINAL  Final  Urine culture     Status: None (Preliminary result)   Collection Time: 06/17/16  4:57 PM  Result Value Ref Range Status   Specimen Description IN/OUT CATH URINE  Final   Special Requests Normal  Final   Culture PENDING  Incomplete   Report Status PENDING  Incomplete  Blood Culture (routine x 2)     Status: None (Preliminary result)   Collection Time: 06/17/16  5:27 PM  Result Value Ref Range Status   Specimen Description BLOOD RIGHT ANTECUBITAL  Final   Special Requests BOTTLES DRAWN AEROBIC AND ANAEROBIC 5CC  Final   Culture  Setup Time   Final     GRAM NEGATIVE RODS IN BOTH AEROBIC AND ANAEROBIC BOTTLES CRITICAL RESULT CALLED TO, READ BACK BY AND VERIFIED WITH: Leonie Green [place], AT [phone] 06/18/16 BY D.VANHOOK    Culture GRAM NEGATIVE RODS  Final   Report Status  PENDING  Incomplete  MRSA PCR Screening     Status: None   Collection Time: 06/18/16  2:07 AM  Result Value Ref Range Status   MRSA by PCR NEGATIVE NEGATIVE Final    Comment:        The GeneXpert MRSA Assay (FDA approved for NASAL specimens only), is one component of a comprehensive MRSA colonization surveillance program. It is not intended to diagnose MRSA infection nor to guide or monitor treatment for MRSA infections.          Radiology Studies: Dg Chest 2 View  Result Date: 06/17/2016 CLINICAL DATA:  Shortness of breath and hypertension EXAM: CHEST  2 VIEW COMPARISON:  Chest radiograph 04/01/2015 FINDINGS: There is shallow lung inflation. There is atherosclerotic calcification within the thoracic aorta. There are bilateral small pleural effusions, right greater than left. There is central pulmonary vascular congestion and streaky right basilar opacities. No pneumothorax. Left axillary clips are noted. Lower thoracic compression fracture is again seen. IMPRESSION: 1. Small chronic bilateral pleural effusions, right greater left. 2. Shallow lung inflation with possible mild pulmonary edema. 3. Bibasilar atelectasis, right greater than left. 4. Aortic atherosclerosis. Electronically Signed   By: Ulyses Jarred M.D.   On: 06/17/2016 20:23        Scheduled Meds: . aspirin EC  81 mg Oral Daily  . cefTRIAXone (ROCEPHIN)  IV  2 g Intravenous Q24H  . enoxaparin (LOVENOX) injection  30 mg Subcutaneous Q24H  . famotidine  20 mg Oral QHS  . furosemide  60 mg Oral Daily  . lisinopril  2.5 mg Oral Daily  . Melatonin  3 mg Oral QHS  . mirabegron ER  50 mg Oral Daily  . polyethylene glycol  17 g Oral BID  . risperiDONE  1 mg Oral QHS  . sertraline  150 mg Oral Daily  .  simethicone  100 mg Oral BID  . thyroid  120 mg Oral Once per day on Sun Mon Tue Wed Thu Fri  . [START ON 06/21/2016] thyroid  60 mg Oral Once per day on Sat   Continuous Infusions:    LOS: 1 day    Time spent: 35 minutes,.     Elmarie Shiley, MD [facility] Pager [phone]  If 7PM-7AM, please contact night-coverage [email or web address] Password [email or web address] 06/18/2016, 10:18 AM

## 2016-06-19 LAB — BASIC METABOLIC PANEL
ANION GAP: 8 (ref 5–15)
BUN: 30 mg/dL — AB (ref 6–20)
CHLORIDE: 107 mmol/L (ref 101–111)
CO2: 22 mmol/L (ref 22–32)
Calcium: 8.5 mg/dL — ABNORMAL LOW (ref 8.9–10.3)
Creatinine, Ser: 0.58 mg/dL (ref 0.44–1.00)
Glucose, Bld: 99 mg/dL (ref 65–99)
POTASSIUM: 3.9 mmol/L (ref 3.5–5.1)
SODIUM: 137 mmol/L (ref 135–145)

## 2016-06-19 LAB — CBC
HCT: 33.9 % — ABNORMAL LOW (ref 36.0–46.0)
Hemoglobin: 10.4 g/dL — ABNORMAL LOW (ref 12.0–15.0)
MCH: 30.4 pg (ref 26.0–34.0)
MCHC: 30.7 g/dL (ref 30.0–36.0)
MCV: 99.1 fL (ref 78.0–100.0)
PLATELETS: 161 10*3/uL (ref 150–400)
RBC: 3.42 MIL/uL — AB (ref 3.87–5.11)
RDW: 14.4 % (ref 11.5–15.5)
WBC: 7 10*3/uL (ref 4.0–10.5)

## 2016-06-19 LAB — URINE CULTURE: SPECIAL REQUESTS: NORMAL

## 2016-06-19 MED ORDER — LORAZEPAM 2 MG/ML IJ SOLN
0.2500 mg | Freq: Once | INTRAMUSCULAR | Status: AC
  Administered 2016-06-19: 0.25 mg via INTRAVENOUS
  Filled 2016-06-19: qty 1

## 2016-06-19 MED ORDER — FUROSEMIDE 10 MG/ML IJ SOLN
40.0000 mg | Freq: Two times a day (BID) | INTRAMUSCULAR | Status: DC
  Administered 2016-06-19 – 2016-06-20 (×3): 40 mg via INTRAVENOUS
  Filled 2016-06-19 (×3): qty 4

## 2016-06-19 MED ORDER — FUROSEMIDE 10 MG/ML IJ SOLN
40.0000 mg | Freq: Every day | INTRAMUSCULAR | Status: DC
  Administered 2016-06-19: 40 mg via INTRAVENOUS
  Filled 2016-06-19: qty 4

## 2016-06-19 MED ORDER — ENOXAPARIN SODIUM 40 MG/0.4ML ~~LOC~~ SOLN
40.0000 mg | SUBCUTANEOUS | Status: DC
  Administered 2016-06-21 – 2016-06-23 (×3): 40 mg via SUBCUTANEOUS
  Filled 2016-06-19 (×4): qty 0.4

## 2016-06-19 MED ORDER — POTASSIUM CHLORIDE CRYS ER 20 MEQ PO TBCR
40.0000 meq | EXTENDED_RELEASE_TABLET | Freq: Once | ORAL | Status: AC
  Administered 2016-06-19: 40 meq via ORAL
  Filled 2016-06-19: qty 2

## 2016-06-19 MED ORDER — FUROSEMIDE 40 MG PO TABS: 60.0000 mg | ORAL_TABLET | Freq: Every day | ORAL | Status: DC

## 2016-06-19 NOTE — Evaluation (Signed)
Occupational Therapy Evaluation Patient Details Name: [name] MRN: [id number] DOB: 1927/05/23 Today's Date: 06/19/2016    History of Present Illness [name] is a 82 y.o. female with medical history significant of HTN, hypothyroidism, breast CA (remote) presenting with SOB, urinary symptoms. Activity order bedrest but MD ordered PT and OT.   Clinical Impression   Pt admitted with UTI. Pt currently with functional limitations due to the deficits listed below (see OT Problem List).  Pt will benefit from skilled OT to increase their safety and independence with ADL and functional mobility for ADL to facilitate discharge to venue listed below. Limited eval performed as current orders are bedrest     Follow Up Recommendations  SNF    Equipment Recommendations  None recommended by OT       Precautions / Restrictions Precautions Precautions: Fall      Mobility Bed Mobility Overal bed mobility: Needs Assistance Bed Mobility: Rolling;Supine to Sit;Sit to Supine Rolling: Mod assist;Max assist   Supine to sit: Max assist- Max VC as pt tends to push in opposite direction of intended direction Sit to supine: Max assist   General bed mobility comments: pt did initiate sitting up briefly but then returned to supine as she leaned posteriorly Pt initially wanted to get to [facility] but then wanted a bed pan. Pt urinated before bed pan placed.  Pt overall very confused as she will not acknowledge she is in hospital  Transfers                 General transfer comment: did not perform transfer this OT visit         ADL Overall ADL's : Needs assistance/impaired                                       General ADL Comments: Pt stated she needed to go to bathroom.  Pt initiated sitting EOB but then leaned back into supine. Pt had already urinated in bed.  . Pt mod- max A for rolling in bed to change pt. Pt resisted movement.                 Pertinent  Vitals/Pain Pain Assessment: No/denies pain              Cognition   Behavior During Therapy: Agitated Overall Cognitive Status: No family/caregiver present to determine baseline cognitive functioning                                Home Living Family/patient expects to be discharged to:: Assisted living (pt lives at ALF)                                                 OT Problem List: Decreased strength;Decreased safety awareness   OT Treatment/Interventions: Self-care/ADL training;DME and/or AE instruction;Patient/family education    OT Goals(Current goals can be found in the care plan section) Acute Rehab OT Goals Patient Stated Goal: did not state OT Goal Formulation: With patient Time For Goal Achievement: 07/03/16 Potential to Achieve Goals: Fair  OT Frequency: Min 2X/week              End of Session Nurse Communication:  Mobility status  Activity Tolerance: Treatment limited secondary to agitation Patient left: in bed;with call bell/phone within reach;with nursing/sitter in room   Time: 1210-1222 OT Time Calculation (min): 12 min Charges:  OT General Charges $OT Visit: 1 Procedure OT Evaluation $OT Eval Moderate Complexity: 1 Procedure G-Codes:    Betsy Pries 2016/06/26, 12:30 PM

## 2016-06-19 NOTE — Evaluation (Signed)
Clinical/Bedside Swallow Evaluation Patient Details  Name: [name] MRN: [id number] Date of Birth: 1927-01-08  Today's Date: 06/19/2016 Time: SLP Start Time (ACUTE ONLY): [id number] SLP Stop Time (ACUTE ONLY): 0928 SLP Time Calculation (min) (ACUTE ONLY): 12 min  Past Medical History:  Past Medical History:  Diagnosis Date  . Atrial fibrillation ([place])   . Breast cancer, left breast ([place])   . Cancer ([place])   . Collagen vascular disease ([place])   . Hypertension   . MVP (mitral valve prolapse)   . Osteoporosis   . Thyroid disease    Past Surgical History:  Past Surgical History:  Procedure Laterality Date  . BREAST LUMPECTOMY    . ESOPHAGOGASTRODUODENOSCOPY N/A 12/19/2014   Procedure: ESOPHAGOGASTRODUODENOSCOPY (EGD);  Surgeon: Teena Irani, MD;  Location: [facility];  Service: Endoscopy;  Laterality: N/A;  . hemrrhoidectomy    . saline implant after left mastectomy     HPI:  Erika Demuth Curryis a 82 y.o.femalewith medical history significant of HTN, hypothyroidism, breast CA (remote) presenting with SOB, urinary symptoms. Per chart, patient has been refusing dose of Lasix at SNF. Dx with a-fib, sepsis with UTI origin, AMS (suspected dementia at baseline, underlying psychiatric disturbance). EGD 12/19/14 small hiatal hernia with minimal lower esophageal ring.   Assessment / Plan / Recommendation Clinical Impression  Patient presents with a functional oropharyngeal swallow with no overt indication of aspiration. No SLP f/u indicated at this time. Risk of aspiration mild given h/o esophageal deficits, AMS, and respiratory needs.      Aspiration Risk  Mild aspiration risk    Diet Recommendation Regular;Thin liquid   Liquid Administration via: Cup;Straw Medication Administration: Whole meds with liquid Supervision: Patient able to self feed Compensations: Slow rate;Small sips/bites Postural Changes: Seated upright at 90 degrees;Remain upright for at least 30 minutes after po intake    Other  Recommendations Oral Care Recommendations: Oral care BID   Follow up Recommendations None                     Swallow Study   General HPI: Erika Mckeone Curryis a 82 y.o.femalewith medical history significant of HTN, hypothyroidism, breast CA (remote) presenting with SOB, urinary symptoms. Per chart, patient has been refusing dose of Lasix at SNF. Dx with a-fib, sepsis with UTI origin, AMS (suspected dementia at baseline, underlying psychiatric disturbance). EGD 12/19/14 small hiatal hernia with minimal lower esophageal ring. Type of Study: Bedside Swallow Evaluation Previous Swallow Assessment: bedside swallow 2016-recommended a regular diet with suspected esophageal dysphagia Diet Prior to this Study: Regular;Thin liquids Temperature Spikes Noted: No Respiratory Status: Nasal cannula History of Recent Intubation: No Behavior/Cognition: Alert;Cooperative;Pleasant mood;Confused Oral Cavity Assessment: Within Functional Limits Oral Care Completed by SLP: No Oral Cavity - Dentition: Adequate natural dentition Vision: Functional for self-feeding Self-Feeding Abilities: Able to feed self Patient Positioning: Upright in bed Baseline Vocal Quality: Hoarse Volitional Cough: Strong Volitional Swallow: Able to elicit    Oral/Motor/Sensory Function Overall Oral Motor/Sensory Function: Within functional limits   Ice Chips Ice chips: Not tested   Thin Liquid Thin Liquid: Within functional limits Presentation: Cup;Self Fed;Straw    Nectar Thick Nectar Thick Liquid: Not tested   Honey Thick Honey Thick Liquid: Not tested   Puree Puree: Within functional limits Presentation: Self Fed;Spoon   Solid   GO  Erika Odonohue MA, CCC-SLP [phone]  Solid: Within functional limits Presentation: Self Fed        [name] 06/19/2016,10:12 AM

## 2016-06-19 NOTE — Progress Notes (Signed)
PROGRESS NOTE    [name]  [id number] DOB: October 31, 1926 DOA: 06/17/2016 PCP: Cari Caraway, MD    Brief Narrative:  [name] is a 82 y.o. female with medical history significant of HTN, hypothyroidism, breast CA (remote) presenting with SOB, urinary symptoms.  She is a poor historian and is unaccompanied.  She reports SOB yesterday.  Off and on for a week or more.  Lots of pain and anxiety.  Pain in abdomen.  Denies urinary symptoms.  Denies cough.    Per ER physician: Patient presents emergency department for assessment of shortness of breath and presents via EMS after there was concerns at nursing facility the patient has had a worsening of known CHF. Patient has been refusing doses of Lasix per documentation on MARtransfer sheet. Patient has no other symptomatic endorsements at this time except for foul-smelling urine with discomfort upon urination. Patient doses no falls no other track injuries denies chest pain or shortness of breath at this time. Denies fevers chills denies abdominal pain or back pain.  Per endorsements from care facility patient is having fatigue increased from baseline and his week that is described as generalized and not focal and causing patient to be unable to have the strength to ambulate which at her baseline includes walker and cane.   ED Course: Per Dr. Freda Munro: Patient presents emergency department in A. fib with RVR to 130s however presents without signs of cardiogenic shock or other perfusion impairment signed. Patient is afebrile per rectal exam has no other concerning vital signs and concern for sepsis at this time. Patient is not on anticoagulation and has known history of paroxysmal atrial fibrillation and has been seen by cardiology in the past discussion was had regarding no anticoagulation. Has documented metoprolol and Lasix in chart however per documentation from nursing home patient is not been receiving Lasix and has been refusing  doses.  Genitourinary endorsement's screening laboratory work along with coagulation profile and urine catheterized sample were obtained within an hour to look for sources of infection or RVR in setting of known atrial fibrillation.    Assessment & Plan:   Principal Problem:   Atrial fibrillation with rapid ventricular response, paroxysmal Active Problems:   Weakness generalized   Hypothyroid   Altered mental status   UTI (lower urinary tract infection)   Essential hypertension     Afib with RVR -Responded to IV lopressor given in ER -IV Lopressor continued prn -Does not appear to be taking anticoagulant, including ASA -resume home dose of metoprolol.  -mild elevation of troponin, suspect related to a fib.   Sepsis; presents with fever, tachycardia, tachypnea, source UTI. Now also with Bacteremia.  Continue with IV antibiotics, ceftriaxone.    Acute hypoxic Respiratory Failure; Acute diastolic HF;  Suspect pulmonary edema. Resume Lasix IV 40 mg BID. Daily weight.  Weight 69 kg.   UTI, Bacteremia gram negative  -E coli bacteremia, antibiotics change to ceftriaxone, which patient has tolerated in the past.  Continue with ceftriaxone. Follow blood culture.  Abdominal distension;   KUB;  Colonic ielum. Per sister patient has history of this, and has decline further evaluation in the past  Had BM last night   AMS, Dementia.  -Suspect mild dementia -Also with apparent underlying psychiatric disturbance as the patient takes Risperdal, Zoloft.  -confuse this morning.   Hypothyroidism -Normal TSH/free T4 -Patient takes Armour Thyroid which is no longer recommended -However, it seems to be working so no changes at this time  HTN -hold  Lisinopril     DVT prophylaxis: Lovenox Code Status: DNR Family Communication: none at bedside.  Disposition Plan: Remain inpatient.   Consultants:   none   Procedures: none   Antimicrobials: ceftriaxone.     Subjective: Confuse, mild dyspnea.   Objective: Vitals:   06/18/16 1208 06/18/16 2200 06/19/16 0238 06/19/16 0400  BP: (!) 148/92 133/76  135/61  Pulse: 99 84  72  Resp: (!) 21 (!) 22  (!) 22  Temp: 98 F (36.7 C) 97.4 F (36.3 C)  97.7 F (36.5 C)  TempSrc: Oral Oral  Oral  SpO2: 99% 95%  100%  Weight:   68.2 kg (150 lb 4.8 oz)   Height:        Intake/Output Summary (Last 24 hours) at 06/19/16 1102 Last data filed at 06/19/16 0900  Gross per 24 hour  Intake              360 ml  Output                0 ml  Net              360 ml   Filed Weights   06/17/16 1604 06/17/16 2248 06/19/16 0238  Weight: 67.1 kg (148 lb) 70 kg (154 lb 4.8 oz) 68.2 kg (150 lb 4.8 oz)    Examination:  General exam: Appears calm and comfortable  Respiratory system: mild dyspnea, bilateral crackles.  Cardiovascular system: S1 & S2 heard, RRR. No JVD, murmurs, rubs, gallops or clicks. No pedal edema. Gastrointestinal system: Abdomen is nondistended, soft and nontender. No organomegaly or masses felt. Normal bowel sounds heard. Central nervous system: Alert and oriented. No focal neurological deficits. Extremities: Symmetric 5 x 5 power. Skin: No rashes, lesions or ulcers     Data Reviewed: I have personally reviewed following labs and imaging studies  CBC:  Recent Labs Lab 06/17/16 1637 06/18/16 0510 06/19/16 0758  WBC 9.4 9.3 7.0  HGB 11.5* 10.3* 10.4*  HCT 36.4 32.9* 33.9*  MCV 97.6 97.9 99.1  PLT 159 164 [id number]   Basic Metabolic Panel:  Recent Labs Lab 06/17/16 1637 06/18/16 0510 06/19/16 0758  NA 137 138 137  K 4.2 4.1 3.9  CL 106 105 107  CO2 22 22 22   GLUCOSE 115* 128* 99  BUN 29* 30* 30*  CREATININE 0.74 0.65 0.58  CALCIUM 8.8* 8.6* 8.5*  MG 2.1  --   --   PHOS 3.5  --   --    GFR: Estimated Creatinine Clearance: 48.2 mL/min (by C-G formula based on SCr of 0.58 mg/dL). Liver Function Tests: No results for input(s): AST, ALT, ALKPHOS, BILITOT, PROT, ALBUMIN  in the last 168 hours. No results for input(s): LIPASE, AMYLASE in the last 168 hours. No results for input(s): AMMONIA in the last 168 hours. Coagulation Profile:  Recent Labs Lab 06/17/16 1637 06/17/16 2322  INR 1.25 1.27   Cardiac Enzymes:  Recent Labs Lab 06/17/16 1637 06/17/16 2322 06/18/16 0510 06/18/16 1100  TROPONINI 0.18* 0.17* 0.25* 0.20*   BNP (last 3 results) No results for input(s): PROBNP in the last 8760 hours. HbA1C: No results for input(s): HGBA1C in the last 72 hours. CBG: No results for input(s): GLUCAP in the last 168 hours. Lipid Profile: No results for input(s): CHOL, HDL, LDLCALC, TRIG, CHOLHDL, LDLDIRECT in the last 72 hours. Thyroid Function Tests:  Recent Labs  06/17/16 1637  TSH 1.264  FREET4 1.01   Anemia Panel: No results for input(s): VITAMINB12,  FOLATE, FERRITIN, TIBC, IRON, RETICCTPCT in the last 72 hours. Sepsis Labs:  Recent Labs Lab 06/17/16 1714 06/17/16 2322 06/18/16 0211  PROCALCITON  --  0.44  --   LATICACIDVEN 1.71 1.6 1.8    Recent Results (from the past 240 hour(s))  Blood Culture (routine x 2)     Status: None (Preliminary result)   Collection Time: 06/17/16  4:41 PM  Result Value Ref Range Status   Specimen Description BLOOD RIGHT ANTECUBITAL  Final   Special Requests BOTTLES DRAWN AEROBIC AND ANAEROBIC 5CC  Final   Culture  Setup Time   Final    GRAM NEGATIVE RODS ANAEROBIC BOTTLE ONLY CRITICAL RESULT CALLED TO, READ BACK BY AND VERIFIED WITH: Leonie Green PHARMD, AT [phone] 06/18/16 BY D.VANHOOK    Culture GRAM NEGATIVE RODS  Final   Report Status PENDING  Incomplete  Blood Culture ID Panel (Reflexed)     Status: Abnormal   Collection Time: 06/17/16  4:41 PM  Result Value Ref Range Status   Enterococcus species NOT DETECTED NOT DETECTED Final   Listeria monocytogenes NOT DETECTED NOT DETECTED Final   Staphylococcus species NOT DETECTED NOT DETECTED Final   Staphylococcus aureus NOT DETECTED NOT DETECTED Final    Streptococcus species NOT DETECTED NOT DETECTED Final   Streptococcus agalactiae NOT DETECTED NOT DETECTED Final   Streptococcus pneumoniae NOT DETECTED NOT DETECTED Final   Streptococcus pyogenes NOT DETECTED NOT DETECTED Final   Acinetobacter baumannii NOT DETECTED NOT DETECTED Final   Enterobacteriaceae species DETECTED (A) NOT DETECTED Final    Comment: CRITICAL RESULT CALLED TO, READ BACK BY AND VERIFIED WITH: Leonie Green PHARMD, AT [phone] 06/18/16 BY D.VANHOOK    Enterobacter cloacae complex NOT DETECTED NOT DETECTED Final   Escherichia coli DETECTED (A) NOT DETECTED Final    Comment: CRITICAL RESULT CALLED TO, READ BACK BY AND VERIFIED WITH: Leonie Green PHARMD, AT [phone] 06/18/16 BY D.VANHOOK    Klebsiella oxytoca NOT DETECTED NOT DETECTED Final   Klebsiella pneumoniae NOT DETECTED NOT DETECTED Final   Proteus species NOT DETECTED NOT DETECTED Final   Serratia marcescens NOT DETECTED NOT DETECTED Final   Carbapenem resistance NOT DETECTED NOT DETECTED Final   Haemophilus influenzae NOT DETECTED NOT DETECTED Final   Neisseria meningitidis NOT DETECTED NOT DETECTED Final   Pseudomonas aeruginosa NOT DETECTED NOT DETECTED Final   Candida albicans NOT DETECTED NOT DETECTED Final   Candida glabrata NOT DETECTED NOT DETECTED Final   Candida krusei NOT DETECTED NOT DETECTED Final   Candida parapsilosis NOT DETECTED NOT DETECTED Final   Candida tropicalis NOT DETECTED NOT DETECTED Final  Gram stain     Status: None   Collection Time: 06/17/16  4:57 PM  Result Value Ref Range Status   Specimen Description IN/OUT CATH URINE  Final   Special Requests Normal  Final   Gram Stain   Final    WBC PRESENT, PREDOMINANTLY PMN GRAM VARIABLE ROD CYTOSPIN SMEAR    Report Status 06/17/2016 FINAL  Final  Urine culture     Status: Abnormal   Collection Time: 06/17/16  4:57 PM  Result Value Ref Range Status   Specimen Description IN/OUT CATH URINE  Final   Special Requests Normal  Final   Culture  >=100,000 COLONIES/mL ESCHERICHIA COLI (A)  Final   Report Status 06/19/2016 FINAL  Final   Organism ID, Bacteria ESCHERICHIA COLI (A)  Final      Susceptibility   Escherichia coli - MIC*    AMPICILLIN 4 SENSITIVE Sensitive  CEFAZOLIN <=4 SENSITIVE Sensitive     CEFTRIAXONE <=1 SENSITIVE Sensitive     CIPROFLOXACIN <=0.25 SENSITIVE Sensitive     GENTAMICIN <=1 SENSITIVE Sensitive     IMIPENEM <=0.25 SENSITIVE Sensitive     NITROFURANTOIN <=16 SENSITIVE Sensitive     TRIMETH/SULFA <=20 SENSITIVE Sensitive     AMPICILLIN/SULBACTAM 4 SENSITIVE Sensitive     PIP/TAZO <=4 SENSITIVE Sensitive     Extended ESBL NEGATIVE Sensitive     * >=100,000 COLONIES/mL ESCHERICHIA COLI  Blood Culture (routine x 2)     Status: None (Preliminary result)   Collection Time: 06/17/16  5:27 PM  Result Value Ref Range Status   Specimen Description BLOOD RIGHT ANTECUBITAL  Final   Special Requests BOTTLES DRAWN AEROBIC AND ANAEROBIC 5CC  Final   Culture  Setup Time   Final    GRAM NEGATIVE RODS IN BOTH AEROBIC AND ANAEROBIC BOTTLES CRITICAL RESULT CALLED TO, READ BACK BY AND VERIFIED WITH: Leonie Green PHARMD, AT [phone] 06/18/16 BY D.VANHOOK    Culture GRAM NEGATIVE RODS  Final   Report Status PENDING  Incomplete  MRSA PCR Screening     Status: None   Collection Time: 06/18/16  2:07 AM  Result Value Ref Range Status   MRSA by PCR NEGATIVE NEGATIVE Final    Comment:        The GeneXpert MRSA Assay (FDA approved for NASAL specimens only), is one component of a comprehensive MRSA colonization surveillance program. It is not intended to diagnose MRSA infection nor to guide or monitor treatment for MRSA infections.          Radiology Studies: Dg Chest 2 View  Result Date: 06/17/2016 CLINICAL DATA:  Shortness of breath and hypertension EXAM: CHEST  2 VIEW COMPARISON:  Chest radiograph 04/01/2015 FINDINGS: There is shallow lung inflation. There is atherosclerotic calcification within the thoracic  aorta. There are bilateral small pleural effusions, right greater than left. There is central pulmonary vascular congestion and streaky right basilar opacities. No pneumothorax. Left axillary clips are noted. Lower thoracic compression fracture is again seen. IMPRESSION: 1. Small chronic bilateral pleural effusions, right greater left. 2. Shallow lung inflation with possible mild pulmonary edema. 3. Bibasilar atelectasis, right greater than left. 4. Aortic atherosclerosis. Electronically Signed   By: Ulyses Jarred M.D.   On: 06/17/2016 20:23   Dg Abd 1 View  Result Date: 06/18/2016 CLINICAL DATA:  Abdominal distention EXAM: ABDOMEN - 1 VIEW COMPARISON:  February 28, 2015 FINDINGS: There is mild colonic dilatation, primarily in the cecal region. No air-fluid levels. No free air. There is calcification in the aorta and iliac arteries. A central calcification in the pelvis likely represents a small calcified uterine leiomyoma. IMPRESSION: Findings felt to be indicative of a degree of colonic ileus, most notably in the cecal region. No obstruction evident. No free air. Probable calcified uterine leiomyoma mid-pelvis. Aortoiliac atherosclerosis noted. Electronically Signed   By: Lowella Grip III M.D.   On: 06/18/2016 14:05        Scheduled Meds: . aspirin EC  81 mg Oral Daily  . cefTRIAXone (ROCEPHIN)  IV  2 g Intravenous Q24H  . enoxaparin (LOVENOX) injection  30 mg Subcutaneous Q24H  . famotidine  20 mg Oral QHS  . furosemide  40 mg Intravenous Daily  . Melatonin  3 mg Oral QHS  . metoprolol tartrate  25 mg Oral BID  . mirabegron ER  50 mg Oral Daily  . polyethylene glycol  17 g Oral BID  .  risperiDONE  1 mg Oral QHS  . sertraline  150 mg Oral Daily  . simethicone  100 mg Oral BID  . thyroid  120 mg Oral Once per day on Sun Mon Tue Wed Thu Fri  . [START ON 06/21/2016] thyroid  60 mg Oral Once per day on Sat   Continuous Infusions:    LOS: 2 days    Time spent: 35 minutes,.      Elmarie Shiley, MD [facility] Pager [phone]  If 7PM-7AM, please contact night-coverage [email or web address] Password [email or web address] 06/19/2016, 11:02 AM

## 2016-06-19 NOTE — Progress Notes (Signed)
PT Cancellation Note  Patient Details Name: [name] MRN: [id number] DOB: 02-May-1927   Cancelled Treatment:    Reason Eval/Treat Not Completed: Other (comment) (pt currently on bedrest and await increased activity order)   Lanetta Inch Beth 06/19/2016, 12:18 PM Elwyn Reach, [place]

## 2016-06-19 NOTE — Evaluation (Signed)
Physical Therapy Evaluation Patient Details Name: [name] MRN: [id number] DOB: Feb 04, 1927 Today's Date: 06/19/2016   History of Present Illness  [name] is a 82 y.o. female with medical history significant of HTN, hypothyroidism, breast CA (remote) presenting with SOB, urinary symptoms  Clinical Impression  Pt relatively pleasant throughout session but confused without awareness of place, time, deficits or lack of ability to follow commands. Pt believes she can mobilize on her own but with attempts recognizes need for assist but is unable to accurately follow commands for safe mobility OOB at this time. Pt with decreased cognition, balance, strength and function who will benefit from acute therapy to maximize mobility and safety. At this time recommend lift for OOB to chair.   HR 82 sats 91% on RA, returned to 1.5L end of session    Follow Up Recommendations Supervision/Assistance - 24 hour;SNF    Equipment Recommendations  None recommended by PT    Recommendations for Other Services       Precautions / Restrictions Precautions Precautions: Fall      Mobility  Bed Mobility Overal bed mobility: Needs Assistance Bed Mobility: Supine to Sit;Sit to Supine Rolling: Min assist   Supine to sit: Mod assist Sit to supine: Max assist   General bed mobility comments: pt able to roll with cues, rail and increased time. Mod assist to elevate trunk fully and increased time to rise from surface. With return to supine max assist for lower body assist onto surface  Transfers Overall transfer level: Needs assistance   Transfers: Sit to/from Stand;Squat Pivot Transfers Sit to Stand: Max assist   Squat pivot transfers: Max assist;+2 physical assistance;+2 safety/equipment     General transfer comment: attempted sit to stand x 2 with max multimodal cues and pt would initiate anterior translation but with anterior assist and knee blocking pt resisting and pushing posteriorly.  Attempted set up for squat pivot but again pt pushing posterior and unable to complete. Pt returned to bed for pt safety. Pt at times agitated with assist stating she can do and in the next breath that she needs help  Ambulation/Gait                Stairs            Wheelchair Mobility    Modified Rankin (Stroke Patients Only)       Balance Overall balance assessment: Needs assistance   Sitting balance-Leahy Scale: Fair       Standing balance-Leahy Scale: Zero                               Pertinent Vitals/Pain Pain Assessment: No/denies pain    Home Living Family/patient expects to be discharged to:: Assisted living               Home Equipment: Walker - 2 wheels;Cane - single point Additional Comments: Pt from [facility] and unable to provide PLOF, sitter in room able to provide PLOF as she also works at [employer]: Independent with assistive device(s)         Comments: staff assists for bathing and meals. Pt typically walks with RW     Hand Dominance        Extremity/Trunk Assessment   Upper Extremity Assessment: Generalized weakness           Lower Extremity Assessment: Generalized weakness  Cervical / Trunk Assessment: Kyphotic  Communication   Communication: No difficulties  Cognition Arousal/Alertness: Awake/alert Behavior During Therapy: Agitated Overall Cognitive Status: Impaired/Different from baseline Area of Impairment: Orientation;Attention;Memory;Following commands;Safety/judgement;Problem solving Orientation Level: Disoriented to;Place;Time;Situation Current Attention Level: Focused Memory: Decreased short-term memory Following Commands: Follows one step commands inconsistently Safety/Judgement: Decreased awareness of deficits;Decreased awareness of safety   Problem Solving: Slow processing;Requires verbal cues General Comments: pt will initiate movement for  anterior translation but then resists posteriorly and unaware of safety and deficits    General Comments      Exercises     Assessment/Plan    PT Assessment Patient needs continued PT services  PT Problem List Decreased strength;Decreased mobility;Decreased safety awareness;Decreased coordination;Decreased activity tolerance;Decreased cognition;Decreased balance          PT Treatment Interventions DME instruction;Therapeutic activities;Gait training;Therapeutic exercise;Patient/family education;Cognitive remediation;Balance training;Functional mobility training    PT Goals (Current goals can be found in the Care Plan section)  Acute Rehab PT Goals Patient Stated Goal: did not state PT Goal Formulation: Patient unable to participate in goal setting Time For Goal Achievement: 07/03/16 Potential to Achieve Goals: Fair    Frequency Min 3X/week   Barriers to discharge Decreased caregiver support      Co-evaluation               End of Session Equipment Utilized During Treatment: Gait belt;Oxygen Activity Tolerance: Patient tolerated treatment well Patient left: in bed;with call bell/phone within reach;with nursing/sitter in room;with bed alarm set Nurse Communication: Mobility status;Precautions         Time: [id number] PT Time Calculation (min) (ACUTE ONLY): 21 min   Charges:   PT Evaluation $PT Eval Moderate Complexity: 1 Procedure     PT G CodesMelford Aase 06/19/2016, 1:29 PM Elwyn Reach, [place]

## 2016-06-20 LAB — BASIC METABOLIC PANEL
Anion gap: 7 (ref 5–15)
BUN: 28 mg/dL — ABNORMAL HIGH (ref 6–20)
CALCIUM: 8.2 mg/dL — AB (ref 8.9–10.3)
CO2: 25 mmol/L (ref 22–32)
CREATININE: 0.65 mg/dL (ref 0.44–1.00)
Chloride: 105 mmol/L (ref 101–111)
GFR calc Af Amer: >60 mL/min (ref >60)
GFR calc non Af Amer: >60 mL/min (ref >60)
GLUCOSE: 95 mg/dL (ref 65–99)
Potassium: 3.5 mmol/L (ref 3.5–5.1)
Sodium: 137 mmol/L (ref 135–145)

## 2016-06-20 LAB — CULTURE, BLOOD (ROUTINE X 2)

## 2016-06-20 MED ORDER — POTASSIUM CHLORIDE CRYS ER 20 MEQ PO TBCR
40.0000 meq | EXTENDED_RELEASE_TABLET | Freq: Once | ORAL | Status: AC
Start: 2016-06-20 — End: 2016-06-20
  Administered 2016-06-20: 40 meq via ORAL
  Filled 2016-06-20: qty 2

## 2016-06-20 NOTE — Progress Notes (Signed)
PROGRESS NOTE    [name]  [id number] DOB: 07-27-1927 DOA: 06/17/2016 PCP: Cari Caraway, MD    Brief Narrative:  [name] is a 82 y.o. female with medical history significant of HTN, hypothyroidism, breast CA (remote) presenting with SOB, urinary symptoms.  She is a poor historian and is unaccompanied.  She reports SOB yesterday.  Off and on for a week or more.  Lots of pain and anxiety.  Pain in abdomen.  Denies urinary symptoms.  Denies cough.    Per ER physician: Patient presents emergency department for assessment of shortness of breath and presents via EMS after there was concerns at nursing facility the patient has had a worsening of known CHF. Patient has been refusing doses of Lasix per documentation on MARtransfer sheet. Patient has no other symptomatic endorsements at this time except for foul-smelling urine with discomfort upon urination. Patient doses no falls no other track injuries denies chest pain or shortness of breath at this time. Denies fevers chills denies abdominal pain or back pain.  Per endorsements from care facility patient is having fatigue increased from baseline and his week that is described as generalized and not focal and causing patient to be unable to have the strength to ambulate which at her baseline includes walker and cane.   ED Course: Per Dr. Freda Munro: Patient presents emergency department in A. fib with RVR to 130s however presents without signs of cardiogenic shock or other perfusion impairment signed. Patient is afebrile per rectal exam has no other concerning vital signs and concern for sepsis at this time. Patient is not on anticoagulation and has known history of paroxysmal atrial fibrillation and has been seen by cardiology in the past discussion was had regarding no anticoagulation. Has documented metoprolol and Lasix in chart however per documentation from nursing home patient is not been receiving Lasix and has been refusing  doses.  Genitourinary endorsement's screening laboratory work along with coagulation profile and urine catheterized sample were obtained within an hour to look for sources of infection or RVR in setting of known atrial fibrillation.    Assessment & Plan:   Principal Problem:   Atrial fibrillation with rapid ventricular response, paroxysmal Active Problems:   Weakness generalized   Hypothyroid   Altered mental status   UTI (lower urinary tract infection)   Essential hypertension     Afib with RVR -Responded to IV lopressor given in ER -IV Lopressor continued prn -Does not appear to be taking anticoagulant, including ASA -resume home dose of metoprolol.  -mild elevation of troponin, suspect related to a fib.   Sepsis; presents with fever, tachycardia, tachypnea, source UTI. Now also with Bacteremia.  Continue with IV antibiotics, ceftriaxone.    Acute hypoxic Respiratory Failure; Acute diastolic HF;  Suspect pulmonary edema.  Lasix IV 40 mg BID. Daily weight.  Weight 69 kg.  Improved.   UTI, Bacteremia gram negative  -E coli bacteremia, antibiotics change to ceftriaxone, which patient has tolerated in the past.  Continue with ceftriaxone. Follow blood culture.  Abdominal distension;   KUB;  Colonic ielum. Per sister patient has history of this, and has decline further evaluation in the past  Had BM last night   AMS, Dementia.  -Suspect mild dementia -Also with apparent underlying psychiatric disturbance as the patient takes Risperdal, Zoloft.  -improved.   Hypothyroidism -Normal TSH/free T4 -Patient takes Armour Thyroid which is no longer recommended -However, it seems to be working so no changes at this time  HTN -hold  Lisinopril     DVT prophylaxis: Lovenox Code Status: DNR Family Communication: none at bedside.  Disposition Plan: Remain inpatient.   Consultants:   none   Procedures: none   Antimicrobials: ceftriaxone.     Subjective: She appears less agitated, less confused  Dyspnea improved.    Objective: Vitals:   06/20/16 0434 06/20/16 0527 06/20/16 1603 06/20/16 1605  BP:  (!) 134/93    Pulse:  73    Resp:  20    Temp:  97.5 F (36.4 C)    TempSrc:  Axillary    SpO2:  98% 100% 100%  Weight: 65.3 kg (143 lb 14.4 oz)     Height:        Intake/Output Summary (Last 24 hours) at 06/20/16 1633 Last data filed at 06/20/16 1300  Gross per 24 hour  Intake              480 ml  Output                0 ml  Net              480 ml   Filed Weights   06/17/16 2248 06/19/16 0238 06/20/16 0434  Weight: 70 kg (154 lb 4.8 oz) 68.2 kg (150 lb 4.8 oz) 65.3 kg (143 lb 14.4 oz)    Examination:  General exam: Appears calm and comfortable  Respiratory system: mild dyspnea, bilateral crackles.  Cardiovascular system: S1 & S2 heard, RRR. No JVD, murmurs, rubs, gallops or clicks. No pedal edema. Gastrointestinal system: Abdomen is nondistended, soft and nontender. No organomegaly or masses felt. Normal bowel sounds heard. Central nervous system: Alert and oriented. No focal neurological deficits. Extremities: Symmetric 5 x 5 power. Skin: No rashes, lesions or ulcers     Data Reviewed: I have personally reviewed following labs and imaging studies  CBC:  Recent Labs Lab 06/17/16 1637 06/18/16 0510 06/19/16 0758  WBC 9.4 9.3 7.0  HGB 11.5* 10.3* 10.4*  HCT 36.4 32.9* 33.9*  MCV 97.6 97.9 99.1  PLT 159 164 [id number]   Basic Metabolic Panel:  Recent Labs Lab 06/17/16 1637 06/18/16 0510 06/19/16 0758 06/20/16 0209  NA 137 138 137 137  K 4.2 4.1 3.9 3.5  CL 106 105 107 105  CO2 22 22 22 25   GLUCOSE 115* 128* 99 95  BUN 29* 30* 30* 28*  CREATININE 0.74 0.65 0.58 0.65  CALCIUM 8.8* 8.6* 8.5* 8.2*  MG 2.1  --   --   --   PHOS 3.5  --   --   --    GFR: Estimated Creatinine Clearance: 48.2 mL/min (by C-G formula based on SCr of 0.65 mg/dL). Liver Function Tests: No results for input(s):  AST, ALT, ALKPHOS, BILITOT, PROT, ALBUMIN in the last 168 hours. No results for input(s): LIPASE, AMYLASE in the last 168 hours. No results for input(s): AMMONIA in the last 168 hours. Coagulation Profile:  Recent Labs Lab 06/17/16 1637 06/17/16 2322  INR 1.25 1.27   Cardiac Enzymes:  Recent Labs Lab 06/17/16 1637 06/17/16 2322 06/18/16 0510 06/18/16 1100  TROPONINI 0.18* 0.17* 0.25* 0.20*   BNP (last 3 results) No results for input(s): PROBNP in the last 8760 hours. HbA1C: No results for input(s): HGBA1C in the last 72 hours. CBG: No results for input(s): GLUCAP in the last 168 hours. Lipid Profile: No results for input(s): CHOL, HDL, LDLCALC, TRIG, CHOLHDL, LDLDIRECT in the last 72 hours. Thyroid Function Tests:  Recent Labs  06/17/16  1637  TSH 1.264  FREET4 1.01   Anemia Panel: No results for input(s): VITAMINB12, FOLATE, FERRITIN, TIBC, IRON, RETICCTPCT in the last 72 hours. Sepsis Labs:  Recent Labs Lab 06/17/16 1714 06/17/16 2322 06/18/16 0211  PROCALCITON  --  0.44  --   LATICACIDVEN 1.71 1.6 1.8    Recent Results (from the past 240 hour(s))  Blood Culture (routine x 2)     Status: Abnormal   Collection Time: 06/17/16  4:41 PM  Result Value Ref Range Status   Specimen Description BLOOD RIGHT ANTECUBITAL  Final   Special Requests BOTTLES DRAWN AEROBIC AND ANAEROBIC 5CC  Final   Culture  Setup Time   Final    GRAM NEGATIVE RODS ANAEROBIC BOTTLE ONLY CRITICAL RESULT CALLED TO, READ BACK BY AND VERIFIED WITH: Leonie Green PHARMD, AT [phone] 06/18/16 BY D.VANHOOK    Culture ESCHERICHIA COLI (A)  Final   Report Status 06/20/2016 FINAL  Final   Organism ID, Bacteria ESCHERICHIA COLI  Final      Susceptibility   Escherichia coli - MIC*    AMPICILLIN 4 SENSITIVE Sensitive     CEFAZOLIN <=4 SENSITIVE Sensitive     CEFEPIME <=1 SENSITIVE Sensitive     CEFTAZIDIME <=1 SENSITIVE Sensitive     CEFTRIAXONE <=1 SENSITIVE Sensitive     CIPROFLOXACIN <=0.25  SENSITIVE Sensitive     GENTAMICIN <=1 SENSITIVE Sensitive     IMIPENEM <=0.25 SENSITIVE Sensitive     TRIMETH/SULFA <=20 SENSITIVE Sensitive     AMPICILLIN/SULBACTAM 4 SENSITIVE Sensitive     PIP/TAZO <=4 SENSITIVE Sensitive     Extended ESBL NEGATIVE Sensitive     * ESCHERICHIA COLI  Blood Culture ID Panel (Reflexed)     Status: Abnormal   Collection Time: 06/17/16  4:41 PM  Result Value Ref Range Status   Enterococcus species NOT DETECTED NOT DETECTED Final   Listeria monocytogenes NOT DETECTED NOT DETECTED Final   Staphylococcus species NOT DETECTED NOT DETECTED Final   Staphylococcus aureus NOT DETECTED NOT DETECTED Final   Streptococcus species NOT DETECTED NOT DETECTED Final   Streptococcus agalactiae NOT DETECTED NOT DETECTED Final   Streptococcus pneumoniae NOT DETECTED NOT DETECTED Final   Streptococcus pyogenes NOT DETECTED NOT DETECTED Final   Acinetobacter baumannii NOT DETECTED NOT DETECTED Final   Enterobacteriaceae species DETECTED (A) NOT DETECTED Final    Comment: CRITICAL RESULT CALLED TO, READ BACK BY AND VERIFIED WITH: Leonie Green PHARMD, AT [phone] 06/18/16 BY D.VANHOOK    Enterobacter cloacae complex NOT DETECTED NOT DETECTED Final   Escherichia coli DETECTED (A) NOT DETECTED Final    Comment: CRITICAL RESULT CALLED TO, READ BACK BY AND VERIFIED WITH: Leonie Green PHARMD, AT [phone] 06/18/16 BY D.VANHOOK    Klebsiella oxytoca NOT DETECTED NOT DETECTED Final   Klebsiella pneumoniae NOT DETECTED NOT DETECTED Final   Proteus species NOT DETECTED NOT DETECTED Final   Serratia marcescens NOT DETECTED NOT DETECTED Final   Carbapenem resistance NOT DETECTED NOT DETECTED Final   Haemophilus influenzae NOT DETECTED NOT DETECTED Final   Neisseria meningitidis NOT DETECTED NOT DETECTED Final   Pseudomonas aeruginosa NOT DETECTED NOT DETECTED Final   Candida albicans NOT DETECTED NOT DETECTED Final   Candida glabrata NOT DETECTED NOT DETECTED Final   Candida krusei NOT DETECTED  NOT DETECTED Final   Candida parapsilosis NOT DETECTED NOT DETECTED Final   Candida tropicalis NOT DETECTED NOT DETECTED Final  Gram stain     Status: None   Collection Time: 06/17/16  4:57 PM  Result Value Ref Range Status   Specimen Description IN/OUT CATH URINE  Final   Special Requests Normal  Final   Gram Stain   Final    WBC PRESENT, PREDOMINANTLY PMN GRAM VARIABLE ROD CYTOSPIN SMEAR    Report Status 06/17/2016 FINAL  Final  Urine culture     Status: Abnormal   Collection Time: 06/17/16  4:57 PM  Result Value Ref Range Status   Specimen Description IN/OUT CATH URINE  Final   Special Requests Normal  Final   Culture >=100,000 COLONIES/mL ESCHERICHIA COLI (A)  Final   Report Status 06/19/2016 FINAL  Final   Organism ID, Bacteria ESCHERICHIA COLI (A)  Final      Susceptibility   Escherichia coli - MIC*    AMPICILLIN 4 SENSITIVE Sensitive     CEFAZOLIN <=4 SENSITIVE Sensitive     CEFTRIAXONE <=1 SENSITIVE Sensitive     CIPROFLOXACIN <=0.25 SENSITIVE Sensitive     GENTAMICIN <=1 SENSITIVE Sensitive     IMIPENEM <=0.25 SENSITIVE Sensitive     NITROFURANTOIN <=16 SENSITIVE Sensitive     TRIMETH/SULFA <=20 SENSITIVE Sensitive     AMPICILLIN/SULBACTAM 4 SENSITIVE Sensitive     PIP/TAZO <=4 SENSITIVE Sensitive     Extended ESBL NEGATIVE Sensitive     * >=100,000 COLONIES/mL ESCHERICHIA COLI  Blood Culture (routine x 2)     Status: Abnormal   Collection Time: 06/17/16  5:27 PM  Result Value Ref Range Status   Specimen Description BLOOD RIGHT ANTECUBITAL  Final   Special Requests BOTTLES DRAWN AEROBIC AND ANAEROBIC 5CC  Final   Culture  Setup Time   Final    GRAM NEGATIVE RODS IN BOTH AEROBIC AND ANAEROBIC BOTTLES CRITICAL RESULT CALLED TO, READ BACK BY AND VERIFIED WITH: Leonie Green PHARMD, AT [phone] 06/18/16 BY D.VANHOOK    Culture (A)  Final    ESCHERICHIA COLI SUSCEPTIBILITIES PERFORMED ON PREVIOUS CULTURE WITHIN THE LAST 5 DAYS.    Report Status 06/20/2016 FINAL  Final   MRSA PCR Screening     Status: None   Collection Time: 06/18/16  2:07 AM  Result Value Ref Range Status   MRSA by PCR NEGATIVE NEGATIVE Final    Comment:        The GeneXpert MRSA Assay (FDA approved for NASAL specimens only), is one component of a comprehensive MRSA colonization surveillance program. It is not intended to diagnose MRSA infection nor to guide or monitor treatment for MRSA infections.          Radiology Studies: No results found.      Scheduled Meds: . aspirin EC  81 mg Oral Daily  . cefTRIAXone (ROCEPHIN)  IV  2 g Intravenous Q24H  . enoxaparin (LOVENOX) injection  40 mg Subcutaneous Q24H  . famotidine  20 mg Oral QHS  . furosemide  40 mg Intravenous Q12H  . Melatonin  3 mg Oral QHS  . metoprolol tartrate  25 mg Oral BID  . mirabegron ER  50 mg Oral Daily  . polyethylene glycol  17 g Oral BID  . risperiDONE  1 mg Oral QHS  . sertraline  150 mg Oral Daily  . simethicone  100 mg Oral BID  . thyroid  120 mg Oral Once per day on Sun Mon Tue Wed Thu Fri  . [START ON 06/21/2016] thyroid  60 mg Oral Once per day on Sat   Continuous Infusions:    LOS: 3 days    Time spent: 35 minutes,.  Elmarie Shiley, MD [facility] Pager [phone]  If 7PM-7AM, please contact night-coverage [email or web address] Password [facility] 06/20/2016, 4:33 PM

## 2016-06-20 NOTE — NC FL2 (Signed)
[place] LEVEL OF CARE SCREENING TOOL     IDENTIFICATION  Patient Name: [name] Birthdate: [birth year] Sex: female Admission Date (Current Location): 06/17/2016  [facility] and [place] Number:  Herbalist and Address:  [facility], [place] [address], [place], [place] [address]      Provider Number: [id number]  Attending Physician Name and Address:  Elmarie Shiley, MD  Relative Name and Phone Number:       Current Level of Care: Hospital Recommended Level of Care: [place], [place] (With HHPT/HHOT) Prior Approval Number:    Date Approved/Denied:   PASRR Number: [id number]  Discharge Plan: Other (Comment) (ALF memory care with HHPT/OT)    Current Diagnoses: Patient Active Problem List   Diagnosis Date Noted  . Essential hypertension 06/18/2016  . Ileus ([place]) 02/23/2015  . Subacute delirium 02/21/2015  . Protein-calorie malnutrition, severe ([place]) 02/21/2015  . UTI (lower urinary tract infection) 02/17/2015  . Hypothyroid 12/19/2014  . Atrial fibrillation with rapid ventricular response, paroxysmal 12/19/2014  . SVT (supraventricular tachycardia) ([place]) 12/19/2014  . Hypokalemia 12/19/2014  . Altered mental status   . Bleeding gastrointestinal   . GI bleeding 12/18/2014  . Varicose veins of lower extremities with ulcer ([place]) 08/02/2014  . Varicose veins of lower extremities with other complications 0000000  . Leg swelling 03/03/2012  . Weakness generalized 04/15/2011  . OSTEOARTHRITIS, SPINE, NOS 12/20/2009  . SACROILIITIS 06/05/2009  . CONTUSION OF MULTIPLE SITES NEC 06/05/2009  . CONSTIPATION 05/29/2009  . VERTEBROBASILAR ARTERY SYNDROME 03/30/2009  . NOSE FRACTURE 03/30/2009  . TARSAL TUNNEL SYNDROME, LEFT 02/12/2009  . OSTEOARTHRITIS, KNEES, BILATERAL 01/04/2009  . OSTEOARTHRITIS, HANDS, BILATERAL 12/21/2008  . ROTATOR CUFF SYNDROME, RIGHT 07/07/2008  . Vitamin D deficiency 03/09/2008   . CONTACT DERMATITIS&OTHER ECZEMA DUE TO PLANTS 03/09/2008  . Osteoporosis, unspecified 03/09/2008    Orientation RESPIRATION BLADDER Height & Weight     Self  O2 (Nasal Canula 2 L) Incontinent Weight: 143 lb 14.4 oz (65.3 kg) Height:  5\' 7"  (170.2 cm)  BEHAVIORAL SYMPTOMS/MOOD NEUROLOGICAL BOWEL NUTRITION STATUS   (None)  (None) Incontinent Diet (Heart healthy)  AMBULATORY STATUS COMMUNICATION OF NEEDS Skin   Extensive Assist Verbally Normal                       Personal Care Assistance Level of Assistance  Bathing, Feeding, Dressing Bathing Assistance: Limited assistance Feeding assistance: Limited assistance Dressing Assistance: Limited assistance     Functional Limitations Info  Sight, Hearing, Speech Sight Info: Adequate Hearing Info: Adequate Speech Info: Adequate    SPECIAL CARE FACTORS FREQUENCY  PT (By licensed PT), OT (By licensed OT), Blood pressure     PT Frequency: 3 x week OT Frequency: 3 x week            Contractures Contractures Info: Not present    Additional Factors Info  Code Status, Allergies Code Status Info: DNR Allergies Info: Cephalexin, Linzess (Linaclotide, Latex, Tape, Ultram (Tramadol Hcl)           Current Medications (06/20/2016):  This is the current hospital active medication list Current Facility-Administered Medications  Medication Dose Route Frequency Provider Last Rate Last Dose  . acetaminophen (TYLENOL) tablet 650 mg  650 mg Oral Q4H PRN Karmen Bongo, MD      . aspirin EC tablet 81 mg  81 mg Oral Daily Karmen Bongo, MD   81 mg at 06/20/16 1100  .  cefTRIAXone (ROCEPHIN) 2 g in dextrose 5 % 50 mL IVPB  2 g Intravenous Q24H Belkys A Regalado, MD   2 g at 06/19/16 1105  . enoxaparin (LOVENOX) injection 40 mg  40 mg Subcutaneous Q24H Belkys A Regalado, MD      . famotidine (PEPCID) tablet 20 mg  20 mg Oral QHS Karmen Bongo, MD   20 mg at 06/19/16 2311  . furosemide (LASIX) injection 40 mg  40 mg Intravenous Q12H  Belkys A Regalado, MD   40 mg at 06/20/16 0428  . Melatonin TABS 3 mg  3 mg Oral QHS Karmen Bongo, MD   3 mg at 06/18/16 2212  . metoprolol (LOPRESSOR) injection 2.5 mg  2.5 mg Intravenous Q5 min PRN Karmen Bongo, MD   2.5 mg at 06/18/16 0153  . metoprolol tartrate (LOPRESSOR) tablet 25 mg  25 mg Oral BID Belkys A Regalado, MD   25 mg at 06/20/16 1100  . mirabegron ER (MYRBETRIQ) tablet 50 mg  50 mg Oral Daily Karmen Bongo, MD   50 mg at 06/20/16 1100  . ondansetron (ZOFRAN) injection 4 mg  4 mg Intravenous Q6H PRN Karmen Bongo, MD      . polyethylene glycol (MIRALAX / GLYCOLAX) packet 17 g  17 g Oral BID Karmen Bongo, MD   17 g at 06/20/16 1100  . risperiDONE (RISPERDAL) 1 MG/ML oral solution 1 mg  1 mg Oral QHS Karmen Bongo, MD   1 mg at 06/19/16 2312  . sertraline (ZOLOFT) 20 MG/ML concentrated solution 150 mg  150 mg Oral Daily Karmen Bongo, MD   150 mg at 06/20/16 1100  . simethicone (MYLICON) 40 [id number] suspension 100 mg  100 mg Oral BID Karmen Bongo, MD   100 mg at 06/20/16 1100  . thyroid (ARMOUR) tablet 120 mg  120 mg Oral Once per day on Sun Mon Tue Wed Thu Fri Karmen Bongo, MD   120 mg at 06/20/16 1100  . [START ON 06/21/2016] thyroid (ARMOUR) tablet 60 mg  60 mg Oral Once per day on Sat Karmen Bongo, MD         Discharge Medications: Please see discharge summary for a list of discharge medications.  Relevant Imaging Results:  Relevant Lab Results:   Additional Information SS#: [id number]  Candie Chroman, LCSW

## 2016-06-20 NOTE — Clinical Social Work Note (Signed)
CSW spoke with Carmine Savoy -patient's sister/ HPOA about PT recommendations for SNF. Carmine Savoy reported that she would like patient to return to [facility] if possible. CSW spoke with [place] from [facility]. She reported patient can return to ALF. CSW will continue to follow for discharge needs.   [employer], LCSW [phone]

## 2016-06-20 NOTE — Care Management Note (Addendum)
Case Management Note Marvetta Gibbons RN, BSN Unit 2W-Case Manager [phone]  Patient Details  Name: GREENLEY BEUCLER MRN: [id number] Date of Birth: 10/06/1926  Subjective/Objective:   Pt admitted with afib                 Action/Plan: PTA lived at [place] consulted for d/c needs (return to ALF vs SNF) pt was active with [facility] [place] for HHRN/PT- if returns to ALF will need resumption orders for [facility] services-   Expected Discharge Date:                  Expected Discharge Plan:  Assisted Living / Rest Home  In-House Referral:  Clinical Social Work  Discharge planning Services  CM Consult  Post Acute Care Choice:    Choice offered to:     DME Arranged:    DME Agency:     HH Arranged:    [place]:   [place]  Status of Service:  In process, will continue to follow  If discussed at Long Length of Stay Meetings, dates discussed:    Additional Comments:  [name], RN 06/20/2016, 11:49 AM

## 2016-06-20 NOTE — Care Management Important Message (Signed)
Important Message  Patient Details  Name: [name] MRN: [id number] Date of Birth: [birth year]   Medicare Important Message Given:  Yes    Yazhini Mcaulay 06/20/2016, 11:10 AM

## 2016-06-20 NOTE — Progress Notes (Addendum)
PHARMACIST - PHYSICIAN COMMUNICATION  CONCERNING:  E Coli bacteremia   RECOMMENDATION: If planning home for discharge, recommend amoxicillin 500 mg po Q 12  to complete antibiotic course.  DESCRIPTION: 82 year old female with E Coli bacteremia pan sensitive Can streamline to amoxicillin  Thank you. Anette Guarneri, PharmD

## 2016-06-21 LAB — CBC
HCT: 33.4 % — ABNORMAL LOW (ref 36.0–46.0)
HEMOGLOBIN: 10.5 g/dL — AB (ref 12.0–15.0)
MCH: 30.5 pg (ref 26.0–34.0)
MCHC: 31.4 g/dL (ref 30.0–36.0)
MCV: 97.1 fL (ref 78.0–100.0)
Platelets: 172 10*3/uL (ref 150–400)
RBC: 3.44 MIL/uL — AB (ref 3.87–5.11)
RDW: 14.1 % (ref 11.5–15.5)
WBC: 7.9 10*3/uL (ref 4.0–10.5)

## 2016-06-21 LAB — BASIC METABOLIC PANEL
Anion gap: 10 (ref 5–15)
BUN: 22 mg/dL — AB (ref 6–20)
CHLORIDE: 102 mmol/L (ref 101–111)
CO2: 28 mmol/L (ref 22–32)
CREATININE: 0.63 mg/dL (ref 0.44–1.00)
Calcium: 8.6 mg/dL — ABNORMAL LOW (ref 8.9–10.3)
GFR calc Af Amer: >60 mL/min (ref >60)
GFR calc non Af Amer: >60 mL/min (ref >60)
GLUCOSE: 114 mg/dL — AB (ref 65–99)
POTASSIUM: 3.5 mmol/L (ref 3.5–5.1)
SODIUM: 140 mmol/L (ref 135–145)

## 2016-06-21 LAB — MAGNESIUM: MAGNESIUM: 1.9 mg/dL (ref 1.7–2.4)

## 2016-06-21 MED ORDER — METOPROLOL TARTRATE 50 MG PO TABS
50.0000 mg | ORAL_TABLET | Freq: Two times a day (BID) | ORAL | Status: DC
  Administered 2016-06-22 – 2016-06-23 (×2): 50 mg via ORAL
  Filled 2016-06-21 (×5): qty 1

## 2016-06-21 MED ORDER — FUROSEMIDE 40 MG PO TABS
60.0000 mg | ORAL_TABLET | Freq: Every day | ORAL | Status: DC
  Administered 2016-06-21 – 2016-06-23 (×3): 60 mg via ORAL
  Filled 2016-06-21 (×3): qty 1

## 2016-06-21 MED ORDER — POTASSIUM CHLORIDE CRYS ER 20 MEQ PO TBCR
40.0000 meq | EXTENDED_RELEASE_TABLET | Freq: Every day | ORAL | Status: DC
  Administered 2016-06-22: 40 meq via ORAL
  Filled 2016-06-21 (×2): qty 2

## 2016-06-21 NOTE — Progress Notes (Signed)
PROGRESS NOTE    [name]  [id number] DOB: 1927/07/24 DOA: 06/17/2016 PCP: Cari Caraway, MD    Brief Narrative:  [name] is a 82 y.o. female with medical history significant of HTN, hypothyroidism, breast CA (remote) presenting with SOB, urinary symptoms.  She is a poor historian and is unaccompanied.  She reports SOB yesterday.  Off and on for a week or more.  Lots of pain and anxiety.  Pain in abdomen.  Denies urinary symptoms.  Denies cough.    Per ER physician: Patient presents emergency department for assessment of shortness of breath and presents via EMS after there was concerns at nursing facility the patient has had a worsening of known CHF. Patient has been refusing doses of Lasix per documentation on MARtransfer sheet. Patient has no other symptomatic endorsements at this time except for foul-smelling urine with discomfort upon urination. Patient doses no falls no other track injuries denies chest pain or shortness of breath at this time. Denies fevers chills denies abdominal pain or back pain.  Per endorsements from care facility patient is having fatigue increased from baseline and his week that is described as generalized and not focal and causing patient to be unable to have the strength to ambulate which at her baseline includes walker and cane.   ED Course: Per Dr. Freda Munro: Patient presents emergency department in A. fib with RVR to 130s however presents without signs of cardiogenic shock or other perfusion impairment signed. Patient is afebrile per rectal exam has no other concerning vital signs and concern for sepsis at this time. Patient is not on anticoagulation and has known history of paroxysmal atrial fibrillation and has been seen by cardiology in the past discussion was had regarding no anticoagulation. Has documented metoprolol and Lasix in chart however per documentation from nursing home patient is not been receiving Lasix and has been refusing  doses.  Genitourinary endorsement's screening laboratory work along with coagulation profile and urine catheterized sample were obtained within an hour to look for sources of infection or RVR in setting of known atrial fibrillation.    Assessment & Plan:   Principal Problem:   Atrial fibrillation with rapid ventricular response, paroxysmal Active Problems:   Weakness generalized   Hypothyroid   Altered mental status   UTI (lower urinary tract infection)   Essential hypertension     Afib with RVR -Responded to IV lopressor given in ER -IV Lopressor continued prn -Does not appear to be taking anticoagulant, including ASA -hr increase this am, increase metoprolol to 50 mg BID>  -mild elevation of troponin, suspect related to a fib.   Sepsis; presents with fever, tachycardia, tachypnea, source UTI. Now also with Bacteremia. E Coli.  Bacteremia.  Continue with IV antibiotics, ceftriaxone.  Repeat blood culture.   Acute hypoxic Respiratory Failure; Acute diastolic HF;  Suspect pulmonary edema. Received Lasix IV 40 mg BID. Change to home dose oral.  Daily weight.  Weight 69 kg.  Improved.   UTI, Bacteremia gram negative  -E coli bacteremia, antibiotics change to ceftriaxone, which patient has tolerated in the past.  Continue with ceftriaxone. Repeat blood culture.   Abdominal distension;   KUB;  Colonic ielum. Per sister patient has history of this, and has decline further evaluation in the past  Had BM last night   AMS, Dementia.  -Suspect mild dementia -Also with apparent underlying psychiatric disturbance as the patient takes Risperdal, Zoloft.  -improved.   Hypothyroidism -Normal TSH/free T4 -Patient takes Armour Thyroid which  is no longer recommended -However, it seems to be working so no changes at this time  HTN -hold  Lisinopril     DVT prophylaxis: Lovenox Code Status: DNR Family Communication: sister over phone  Disposition Plan: Remain  inpatient.   Consultants:   none   Procedures: none   Antimicrobials: ceftriaxone.    Subjective: Breathing better, less confuse, gets agitated at times.  HR elevated today.    Objective: Vitals:   06/20/16 1651 06/20/16 1937 06/20/16 2116 06/21/16 0417  BP: (!) 132/58 133/62 (!) 149/67 97/71  Pulse: 61 76 82 99  Resp: 19 18  20   Temp:  98.3 F (36.8 C)  99 F (37.2 C)  TempSrc:  Axillary  Axillary  SpO2: 97% 91%  100%  Weight:    63.8 kg (140 lb 9.6 oz)  Height:        Intake/Output Summary (Last 24 hours) at 06/21/16 1016 Last data filed at 06/20/16 1601  Gross per 24 hour  Intake              290 ml  Output                0 ml  Net              290 ml   Filed Weights   06/19/16 0238 06/20/16 0434 06/21/16 0417  Weight: 68.2 kg (150 lb 4.8 oz) 65.3 kg (143 lb 14.4 oz) 63.8 kg (140 lb 9.6 oz)    Examination:  General exam: Appears calm and comfortable  Respiratory system: mild dyspnea, bilateral crackles.  Cardiovascular system: S1 & S2 heard, RRR. No JVD, murmurs, rubs, gallops or clicks. No pedal edema. Gastrointestinal system: Abdomen is nondistended, soft and nontender. No organomegaly or masses felt. Normal bowel sounds heard. Central nervous system: Alert and oriented. No focal neurological deficits. Extremities: Symmetric 5 x 5 power. Skin: No rashes, lesions or ulcers     Data Reviewed: I have personally reviewed following labs and imaging studies  CBC:  Recent Labs Lab 06/17/16 1637 06/18/16 0510 06/19/16 0758 06/21/16 0456  WBC 9.4 9.3 7.0 7.9  HGB 11.5* 10.3* 10.4* 10.5*  HCT 36.4 32.9* 33.9* 33.4*  MCV 97.6 97.9 99.1 97.1  PLT 159 164 161 [id number]   Basic Metabolic Panel:  Recent Labs Lab 06/17/16 1637 06/18/16 0510 06/19/16 0758 06/20/16 0209 06/21/16 0456  NA 137 138 137 137 140  K 4.2 4.1 3.9 3.5 3.5  CL 106 105 107 105 102  CO2 22 22 22 25 28   GLUCOSE 115* 128* 99 95 114*  BUN 29* 30* 30* 28* 22*  CREATININE 0.74  0.65 0.58 0.65 0.63  CALCIUM 8.8* 8.6* 8.5* 8.2* 8.6*  MG 2.1  --   --   --   --   PHOS 3.5  --   --   --   --    GFR: Estimated Creatinine Clearance: 48.2 mL/min (by C-G formula based on SCr of 0.63 mg/dL). Liver Function Tests: No results for input(s): AST, ALT, ALKPHOS, BILITOT, PROT, ALBUMIN in the last 168 hours. No results for input(s): LIPASE, AMYLASE in the last 168 hours. No results for input(s): AMMONIA in the last 168 hours. Coagulation Profile:  Recent Labs Lab 06/17/16 1637 06/17/16 2322  INR 1.25 1.27   Cardiac Enzymes:  Recent Labs Lab 06/17/16 1637 06/17/16 2322 06/18/16 0510 06/18/16 1100  TROPONINI 0.18* 0.17* 0.25* 0.20*   BNP (last 3 results) No results for input(s): PROBNP in the  last 8760 hours. HbA1C: No results for input(s): HGBA1C in the last 72 hours. CBG: No results for input(s): GLUCAP in the last 168 hours. Lipid Profile: No results for input(s): CHOL, HDL, LDLCALC, TRIG, CHOLHDL, LDLDIRECT in the last 72 hours. Thyroid Function Tests: No results for input(s): TSH, T4TOTAL, FREET4, T3FREE, THYROIDAB in the last 72 hours. Anemia Panel: No results for input(s): VITAMINB12, FOLATE, FERRITIN, TIBC, IRON, RETICCTPCT in the last 72 hours. Sepsis Labs:  Recent Labs Lab 06/17/16 1714 06/17/16 2322 06/18/16 0211  PROCALCITON  --  0.44  --   LATICACIDVEN 1.71 1.6 1.8    Recent Results (from the past 240 hour(s))  Blood Culture (routine x 2)     Status: Abnormal   Collection Time: 06/17/16  4:41 PM  Result Value Ref Range Status   Specimen Description BLOOD RIGHT ANTECUBITAL  Final   Special Requests BOTTLES DRAWN AEROBIC AND ANAEROBIC 5CC  Final   Culture  Setup Time   Final    GRAM NEGATIVE RODS ANAEROBIC BOTTLE ONLY CRITICAL RESULT CALLED TO, READ BACK BY AND VERIFIED WITH: Leonie Green PHARMD, AT [phone] 06/18/16 BY D.VANHOOK    Culture ESCHERICHIA COLI (A)  Final   Report Status 06/20/2016 FINAL  Final   Organism ID, Bacteria  ESCHERICHIA COLI  Final      Susceptibility   Escherichia coli - MIC*    AMPICILLIN 4 SENSITIVE Sensitive     CEFAZOLIN <=4 SENSITIVE Sensitive     CEFEPIME <=1 SENSITIVE Sensitive     CEFTAZIDIME <=1 SENSITIVE Sensitive     CEFTRIAXONE <=1 SENSITIVE Sensitive     CIPROFLOXACIN <=0.25 SENSITIVE Sensitive     GENTAMICIN <=1 SENSITIVE Sensitive     IMIPENEM <=0.25 SENSITIVE Sensitive     TRIMETH/SULFA <=20 SENSITIVE Sensitive     AMPICILLIN/SULBACTAM 4 SENSITIVE Sensitive     PIP/TAZO <=4 SENSITIVE Sensitive     Extended ESBL NEGATIVE Sensitive     * ESCHERICHIA COLI  Blood Culture ID Panel (Reflexed)     Status: Abnormal   Collection Time: 06/17/16  4:41 PM  Result Value Ref Range Status   Enterococcus species NOT DETECTED NOT DETECTED Final   Listeria monocytogenes NOT DETECTED NOT DETECTED Final   Staphylococcus species NOT DETECTED NOT DETECTED Final   Staphylococcus aureus NOT DETECTED NOT DETECTED Final   Streptococcus species NOT DETECTED NOT DETECTED Final   Streptococcus agalactiae NOT DETECTED NOT DETECTED Final   Streptococcus pneumoniae NOT DETECTED NOT DETECTED Final   Streptococcus pyogenes NOT DETECTED NOT DETECTED Final   Acinetobacter baumannii NOT DETECTED NOT DETECTED Final   Enterobacteriaceae species DETECTED (A) NOT DETECTED Final    Comment: CRITICAL RESULT CALLED TO, READ BACK BY AND VERIFIED WITH: Leonie Green PHARMD, AT [phone] 06/18/16 BY D.VANHOOK    Enterobacter cloacae complex NOT DETECTED NOT DETECTED Final   Escherichia coli DETECTED (A) NOT DETECTED Final    Comment: CRITICAL RESULT CALLED TO, READ BACK BY AND VERIFIED WITH: Leonie Green PHARMD, AT [phone] 06/18/16 BY D.VANHOOK    Klebsiella oxytoca NOT DETECTED NOT DETECTED Final   Klebsiella pneumoniae NOT DETECTED NOT DETECTED Final   Proteus species NOT DETECTED NOT DETECTED Final   Serratia marcescens NOT DETECTED NOT DETECTED Final   Carbapenem resistance NOT DETECTED NOT DETECTED Final   Haemophilus  influenzae NOT DETECTED NOT DETECTED Final   Neisseria meningitidis NOT DETECTED NOT DETECTED Final   Pseudomonas aeruginosa NOT DETECTED NOT DETECTED Final   Candida albicans NOT DETECTED NOT DETECTED Final  Candida glabrata NOT DETECTED NOT DETECTED Final   Candida krusei NOT DETECTED NOT DETECTED Final   Candida parapsilosis NOT DETECTED NOT DETECTED Final   Candida tropicalis NOT DETECTED NOT DETECTED Final  Gram stain     Status: None   Collection Time: 06/17/16  4:57 PM  Result Value Ref Range Status   Specimen Description IN/OUT CATH URINE  Final   Special Requests Normal  Final   Gram Stain   Final    WBC PRESENT, PREDOMINANTLY PMN GRAM VARIABLE ROD CYTOSPIN SMEAR    Report Status 06/17/2016 FINAL  Final  Urine culture     Status: Abnormal   Collection Time: 06/17/16  4:57 PM  Result Value Ref Range Status   Specimen Description IN/OUT CATH URINE  Final   Special Requests Normal  Final   Culture >=100,000 COLONIES/mL ESCHERICHIA COLI (A)  Final   Report Status 06/19/2016 FINAL  Final   Organism ID, Bacteria ESCHERICHIA COLI (A)  Final      Susceptibility   Escherichia coli - MIC*    AMPICILLIN 4 SENSITIVE Sensitive     CEFAZOLIN <=4 SENSITIVE Sensitive     CEFTRIAXONE <=1 SENSITIVE Sensitive     CIPROFLOXACIN <=0.25 SENSITIVE Sensitive     GENTAMICIN <=1 SENSITIVE Sensitive     IMIPENEM <=0.25 SENSITIVE Sensitive     NITROFURANTOIN <=16 SENSITIVE Sensitive     TRIMETH/SULFA <=20 SENSITIVE Sensitive     AMPICILLIN/SULBACTAM 4 SENSITIVE Sensitive     PIP/TAZO <=4 SENSITIVE Sensitive     Extended ESBL NEGATIVE Sensitive     * >=100,000 COLONIES/mL ESCHERICHIA COLI  Blood Culture (routine x 2)     Status: Abnormal   Collection Time: 06/17/16  5:27 PM  Result Value Ref Range Status   Specimen Description BLOOD RIGHT ANTECUBITAL  Final   Special Requests BOTTLES DRAWN AEROBIC AND ANAEROBIC 5CC  Final   Culture  Setup Time   Final    GRAM NEGATIVE RODS IN BOTH  AEROBIC AND ANAEROBIC BOTTLES CRITICAL RESULT CALLED TO, READ BACK BY AND VERIFIED WITH: Leonie Green PHARMD, AT [phone] 06/18/16 BY D.VANHOOK    Culture (A)  Final    ESCHERICHIA COLI SUSCEPTIBILITIES PERFORMED ON PREVIOUS CULTURE WITHIN THE LAST 5 DAYS.    Report Status 06/20/2016 FINAL  Final  MRSA PCR Screening     Status: None   Collection Time: 06/18/16  2:07 AM  Result Value Ref Range Status   MRSA by PCR NEGATIVE NEGATIVE Final    Comment:        The GeneXpert MRSA Assay (FDA approved for NASAL specimens only), is one component of a comprehensive MRSA colonization surveillance program. It is not intended to diagnose MRSA infection nor to guide or monitor treatment for MRSA infections.          Radiology Studies: No results found.      Scheduled Meds: . aspirin EC  81 mg Oral Daily  . cefTRIAXone (ROCEPHIN)  IV  2 g Intravenous Q24H  . enoxaparin (LOVENOX) injection  40 mg Subcutaneous Q24H  . famotidine  20 mg Oral QHS  . furosemide  60 mg Oral Daily  . Melatonin  3 mg Oral QHS  . metoprolol tartrate  25 mg Oral BID  . mirabegron ER  50 mg Oral Daily  . polyethylene glycol  17 g Oral BID  . potassium chloride  40 mEq Oral Daily  . risperiDONE  1 mg Oral QHS  . sertraline  150 mg Oral Daily  .  simethicone  100 mg Oral BID  . thyroid  120 mg Oral Once per day on Sun Mon Tue Wed Thu Fri  . thyroid  60 mg Oral Once per day on Sat   Continuous Infusions:    LOS: 4 days    Time spent: 35 minutes,.     Elmarie Shiley, MD [facility] Pager [phone]  If 7PM-7AM, please contact night-coverage [email or web address] Password [email or web address] 06/21/2016, 10:16 AM

## 2016-06-22 LAB — BASIC METABOLIC PANEL
ANION GAP: 5 (ref 5–15)
BUN: 25 mg/dL — ABNORMAL HIGH (ref 6–20)
CALCIUM: 8.2 mg/dL — AB (ref 8.9–10.3)
CO2: 31 mmol/L (ref 22–32)
CREATININE: 0.71 mg/dL (ref 0.44–1.00)
Chloride: 103 mmol/L (ref 101–111)
GLUCOSE: 126 mg/dL — AB (ref 65–99)
Potassium: 3.1 mmol/L — ABNORMAL LOW (ref 3.5–5.1)
Sodium: 139 mmol/L (ref 135–145)

## 2016-06-22 MED ORDER — POTASSIUM CITRATE ER 10 MEQ (1080 MG) PO TBCR
20.0000 meq | EXTENDED_RELEASE_TABLET | Freq: Every day | ORAL | Status: DC
  Filled 2016-06-22: qty 2

## 2016-06-22 MED ORDER — SERTRALINE HCL 50 MG PO TABS
150.0000 mg | ORAL_TABLET | Freq: Every day | ORAL | Status: DC
  Administered 2016-06-23: 150 mg via ORAL
  Filled 2016-06-22: qty 1

## 2016-06-22 NOTE — Progress Notes (Signed)
PROGRESS NOTE    [name]  [id number] DOB: [birth year] DOA: 06/17/2016 PCP: Cari Caraway, MD    Brief Narrative:  [name] is a 82 y.o. female with medical history significant of HTN, hypothyroidism, breast CA (remote) presenting with SOB, urinary symptoms.  She is a poor historian and is unaccompanied.  She reports SOB yesterday.  Off and on for a week or more.  Lots of pain and anxiety.  Pain in abdomen.  Denies urinary symptoms.  Denies cough.    Per ER physician: Patient presents emergency department for assessment of shortness of breath and presents via EMS after there was concerns at nursing facility the patient has had a worsening of known CHF. Patient has been refusing doses of Lasix per documentation on MARtransfer sheet. Patient has no other symptomatic endorsements at this time except for foul-smelling urine with discomfort upon urination. Patient doses no falls no other track injuries denies chest pain or shortness of breath at this time. Denies fevers chills denies abdominal pain or back pain.  Per endorsements from care facility patient is having fatigue increased from baseline and his week that is described as generalized and not focal and causing patient to be unable to have the strength to ambulate which at her baseline includes walker and cane.   ED Course: Per Dr. Freda Munro: Patient presents emergency department in A. fib with RVR to 130s however presents without signs of cardiogenic shock or other perfusion impairment signed. Patient is afebrile per rectal exam has no other concerning vital signs and concern for sepsis at this time. Patient is not on anticoagulation and has known history of paroxysmal atrial fibrillation and has been seen by cardiology in the past discussion was had regarding no anticoagulation. Has documented metoprolol and Lasix in chart however per documentation from nursing home patient is not been receiving Lasix and has been refusing  doses.  Genitourinary endorsement's screening laboratory work along with coagulation profile and urine catheterized sample were obtained within an hour to look for sources of infection or RVR in setting of known atrial fibrillation.    Assessment & Plan:   Principal Problem:   Atrial fibrillation with rapid ventricular response, paroxysmal Active Problems:   Weakness generalized   Hypothyroid   Altered mental status   UTI (lower urinary tract infection)   Essential hypertension     Afib with RVR -Responded to IV lopressor given in ER -IV Lopressor continued prn -Does not appear to be taking anticoagulant, including ASA -hr increase this am, increased metoprolol to 50 mg BID on 09-30>  -mild elevation of troponin, suspect related to a fib.  -hr better controlled today   Sepsis; presents with fever, tachycardia, tachypnea, source UTI. Now also with Bacteremia. E Coli.  Bacteremia.  Continue with IV antibiotics, ceftriaxone.  Repeat blood culture 09-30 pending.   Acute hypoxic Respiratory Failure; Acute diastolic HF;  Suspect pulmonary edema. Received Lasix IV 40 mg BID. Change to home dose oral.  Daily weight.  Weight 69 kg.  Improved.   UTI, Bacteremia gram negative  -E coli bacteremia, antibiotics change to ceftriaxone, which patient has tolerated in the past.  Continue with ceftriaxone, day 5 antibiotics. Repeat blood culture pending. .  Needs 2 weeks of treatment.   Abdominal distension;   KUB;  Colonic ielum. Per sister patient has history of this, and has decline further evaluation in the past  Had BM last night   AMS, Dementia.  -Suspect mild dementia -Also with apparent underlying psychiatric  disturbance as the patient takes Risperdal, Zoloft.  -improved.   Hypothyroidism -Normal TSH/free T4 -Patient takes Armour Thyroid which is no longer recommended -However, it seems to be working so no changes at this time  HTN -hold  Lisinopril     DVT  prophylaxis: Lovenox Code Status: DNR Family Communication: sister over phone  Disposition Plan: Remain inpatient.  Transfer back to her facility 10-02  Consultants:   none   Procedures: none   Antimicrobials: ceftriaxone.    Subjective: Breathing better, less confuse    Objective: Vitals:   06/21/16 1947 06/22/16 0545 06/22/16 1009 06/22/16 1443  BP: 101/63 (!) 144/77 (!) 145/77 103/71  Pulse: 85 82 91 96  Resp: 19 18  19   Temp: 98.6 F (37 C) 97.8 F (36.6 C)  97.8 F (36.6 C)  TempSrc: Oral Oral  Oral  SpO2:  96%  92%  Weight:      Height:        Intake/Output Summary (Last 24 hours) at 06/22/16 1446 Last data filed at 06/21/16 1814  Gross per 24 hour  Intake                0 ml  Output                0 ml  Net                0 ml   Filed Weights   06/19/16 0238 06/20/16 0434 06/21/16 0417  Weight: 68.2 kg (150 lb 4.8 oz) 65.3 kg (143 lb 14.4 oz) 63.8 kg (140 lb 9.6 oz)    Examination:  General exam: Appears calm and comfortable  Respiratory system: mild dyspnea, bilateral crackles.  Cardiovascular system: S1 & S2 heard, RRR. No JVD, murmurs, rubs, gallops or clicks. No pedal edema. Gastrointestinal system: Abdomen is nondistended, soft and nontender. No organomegaly or masses felt. Normal bowel sounds heard. Central nervous system: Alert and oriented. No focal neurological deficits. Extremities: Symmetric 5 x 5 power. Skin: No rashes, lesions or ulcers     Data Reviewed: I have personally reviewed following labs and imaging studies  CBC:  Recent Labs Lab 06/17/16 1637 06/18/16 0510 06/19/16 0758 06/21/16 0456  WBC 9.4 9.3 7.0 7.9  HGB 11.5* 10.3* 10.4* 10.5*  HCT 36.4 32.9* 33.9* 33.4*  MCV 97.6 97.9 99.1 97.1  PLT 159 164 161 [id number]   Basic Metabolic Panel:  Recent Labs Lab 06/17/16 1637 06/18/16 0510 06/19/16 0758 06/20/16 0209 06/21/16 0456 06/21/16 1928  NA 137 138 137 137 140  --   K 4.2 4.1 3.9 3.5 3.5  --   CL 106 105  107 105 102  --   CO2 22 22 22 25 28   --   GLUCOSE 115* 128* 99 95 114*  --   BUN 29* 30* 30* 28* 22*  --   CREATININE 0.74 0.65 0.58 0.65 0.63  --   CALCIUM 8.8* 8.6* 8.5* 8.2* 8.6*  --   MG 2.1  --   --   --   --  1.9  PHOS 3.5  --   --   --   --   --    GFR: Estimated Creatinine Clearance: 48.2 mL/min (by C-G formula based on SCr of 0.63 mg/dL). Liver Function Tests: No results for input(s): AST, ALT, ALKPHOS, BILITOT, PROT, ALBUMIN in the last 168 hours. No results for input(s): LIPASE, AMYLASE in the last 168 hours. No results for input(s): AMMONIA in the last 168  hours. Coagulation Profile:  Recent Labs Lab 06/17/16 1637 06/17/16 2322  INR 1.25 1.27   Cardiac Enzymes:  Recent Labs Lab 06/17/16 1637 06/17/16 2322 06/18/16 0510 06/18/16 1100  TROPONINI 0.18* 0.17* 0.25* 0.20*   BNP (last 3 results) No results for input(s): PROBNP in the last 8760 hours. HbA1C: No results for input(s): HGBA1C in the last 72 hours. CBG: No results for input(s): GLUCAP in the last 168 hours. Lipid Profile: No results for input(s): CHOL, HDL, LDLCALC, TRIG, CHOLHDL, LDLDIRECT in the last 72 hours. Thyroid Function Tests: No results for input(s): TSH, T4TOTAL, FREET4, T3FREE, THYROIDAB in the last 72 hours. Anemia Panel: No results for input(s): VITAMINB12, FOLATE, FERRITIN, TIBC, IRON, RETICCTPCT in the last 72 hours. Sepsis Labs:  Recent Labs Lab 06/17/16 1714 06/17/16 2322 06/18/16 0211  PROCALCITON  --  0.44  --   LATICACIDVEN 1.71 1.6 1.8    Recent Results (from the past 240 hour(s))  Blood Culture (routine x 2)     Status: Abnormal   Collection Time: 06/17/16  4:41 PM  Result Value Ref Range Status   Specimen Description BLOOD RIGHT ANTECUBITAL  Final   Special Requests BOTTLES DRAWN AEROBIC AND ANAEROBIC 5CC  Final   Culture  Setup Time   Final    GRAM NEGATIVE RODS ANAEROBIC BOTTLE ONLY CRITICAL RESULT CALLED TO, READ BACK BY AND VERIFIED WITH: Leonie Green PHARMD,  AT [phone] 06/18/16 BY D.VANHOOK    Culture ESCHERICHIA COLI (A)  Final   Report Status 06/20/2016 FINAL  Final   Organism ID, Bacteria ESCHERICHIA COLI  Final      Susceptibility   Escherichia coli - MIC*    AMPICILLIN 4 SENSITIVE Sensitive     CEFAZOLIN <=4 SENSITIVE Sensitive     CEFEPIME <=1 SENSITIVE Sensitive     CEFTAZIDIME <=1 SENSITIVE Sensitive     CEFTRIAXONE <=1 SENSITIVE Sensitive     CIPROFLOXACIN <=0.25 SENSITIVE Sensitive     GENTAMICIN <=1 SENSITIVE Sensitive     IMIPENEM <=0.25 SENSITIVE Sensitive     TRIMETH/SULFA <=20 SENSITIVE Sensitive     AMPICILLIN/SULBACTAM 4 SENSITIVE Sensitive     PIP/TAZO <=4 SENSITIVE Sensitive     Extended ESBL NEGATIVE Sensitive     * ESCHERICHIA COLI  Blood Culture ID Panel (Reflexed)     Status: Abnormal   Collection Time: 06/17/16  4:41 PM  Result Value Ref Range Status   Enterococcus species NOT DETECTED NOT DETECTED Final   Listeria monocytogenes NOT DETECTED NOT DETECTED Final   Staphylococcus species NOT DETECTED NOT DETECTED Final   Staphylococcus aureus NOT DETECTED NOT DETECTED Final   Streptococcus species NOT DETECTED NOT DETECTED Final   Streptococcus agalactiae NOT DETECTED NOT DETECTED Final   Streptococcus pneumoniae NOT DETECTED NOT DETECTED Final   Streptococcus pyogenes NOT DETECTED NOT DETECTED Final   Acinetobacter baumannii NOT DETECTED NOT DETECTED Final   Enterobacteriaceae species DETECTED (A) NOT DETECTED Final    Comment: CRITICAL RESULT CALLED TO, READ BACK BY AND VERIFIED WITH: Leonie Green PHARMD, AT [phone] 06/18/16 BY D.VANHOOK    Enterobacter cloacae complex NOT DETECTED NOT DETECTED Final   Escherichia coli DETECTED (A) NOT DETECTED Final    Comment: CRITICAL RESULT CALLED TO, READ BACK BY AND VERIFIED WITH: Leonie Green PHARMD, AT [phone] 06/18/16 BY D.VANHOOK    Klebsiella oxytoca NOT DETECTED NOT DETECTED Final   Klebsiella pneumoniae NOT DETECTED NOT DETECTED Final   Proteus species NOT DETECTED NOT  DETECTED Final   Serratia marcescens NOT  DETECTED NOT DETECTED Final   Carbapenem resistance NOT DETECTED NOT DETECTED Final   Haemophilus influenzae NOT DETECTED NOT DETECTED Final   Neisseria meningitidis NOT DETECTED NOT DETECTED Final   Pseudomonas aeruginosa NOT DETECTED NOT DETECTED Final   Candida albicans NOT DETECTED NOT DETECTED Final   Candida glabrata NOT DETECTED NOT DETECTED Final   Candida krusei NOT DETECTED NOT DETECTED Final   Candida parapsilosis NOT DETECTED NOT DETECTED Final   Candida tropicalis NOT DETECTED NOT DETECTED Final  Gram stain     Status: None   Collection Time: 06/17/16  4:57 PM  Result Value Ref Range Status   Specimen Description IN/OUT CATH URINE  Final   Special Requests Normal  Final   Gram Stain   Final    WBC PRESENT, PREDOMINANTLY PMN GRAM VARIABLE ROD CYTOSPIN SMEAR    Report Status 06/17/2016 FINAL  Final  Urine culture     Status: Abnormal   Collection Time: 06/17/16  4:57 PM  Result Value Ref Range Status   Specimen Description IN/OUT CATH URINE  Final   Special Requests Normal  Final   Culture >=100,000 COLONIES/mL ESCHERICHIA COLI (A)  Final   Report Status 06/19/2016 FINAL  Final   Organism ID, Bacteria ESCHERICHIA COLI (A)  Final      Susceptibility   Escherichia coli - MIC*    AMPICILLIN 4 SENSITIVE Sensitive     CEFAZOLIN <=4 SENSITIVE Sensitive     CEFTRIAXONE <=1 SENSITIVE Sensitive     CIPROFLOXACIN <=0.25 SENSITIVE Sensitive     GENTAMICIN <=1 SENSITIVE Sensitive     IMIPENEM <=0.25 SENSITIVE Sensitive     NITROFURANTOIN <=16 SENSITIVE Sensitive     TRIMETH/SULFA <=20 SENSITIVE Sensitive     AMPICILLIN/SULBACTAM 4 SENSITIVE Sensitive     PIP/TAZO <=4 SENSITIVE Sensitive     Extended ESBL NEGATIVE Sensitive     * >=100,000 COLONIES/mL ESCHERICHIA COLI  Blood Culture (routine x 2)     Status: Abnormal   Collection Time: 06/17/16  5:27 PM  Result Value Ref Range Status   Specimen Description BLOOD RIGHT ANTECUBITAL   Final   Special Requests BOTTLES DRAWN AEROBIC AND ANAEROBIC 5CC  Final   Culture  Setup Time   Final    GRAM NEGATIVE RODS IN BOTH AEROBIC AND ANAEROBIC BOTTLES CRITICAL RESULT CALLED TO, READ BACK BY AND VERIFIED WITH: Leonie Green PHARMD, AT [phone] 06/18/16 BY D.VANHOOK    Culture (A)  Final    ESCHERICHIA COLI SUSCEPTIBILITIES PERFORMED ON PREVIOUS CULTURE WITHIN THE LAST 5 DAYS.    Report Status 06/20/2016 FINAL  Final  MRSA PCR Screening     Status: None   Collection Time: 06/18/16  2:07 AM  Result Value Ref Range Status   MRSA by PCR NEGATIVE NEGATIVE Final    Comment:        The GeneXpert MRSA Assay (FDA approved for NASAL specimens only), is one component of a comprehensive MRSA colonization surveillance program. It is not intended to diagnose MRSA infection nor to guide or monitor treatment for MRSA infections.          Radiology Studies: No results found.      Scheduled Meds: . aspirin EC  81 mg Oral Daily  . cefTRIAXone (ROCEPHIN)  IV  2 g Intravenous Q24H  . enoxaparin (LOVENOX) injection  40 mg Subcutaneous Q24H  . famotidine  20 mg Oral QHS  . furosemide  60 mg Oral Daily  . Melatonin  3 mg Oral QHS  .  metoprolol tartrate  50 mg Oral BID  . mirabegron ER  50 mg Oral Daily  . polyethylene glycol  17 g Oral BID  . [START ON 06/23/2016] potassium citrate  20 mEq Oral Daily  . risperiDONE  1 mg Oral QHS  . [START ON 06/23/2016] sertraline  150 mg Oral Daily  . simethicone  100 mg Oral BID  . thyroid  120 mg Oral Once per day on Sun Mon Tue Wed Thu Fri  . thyroid  60 mg Oral Once per day on Sat   Continuous Infusions:    LOS: 5 days    Time spent: 35 minutes,.     Elmarie Shiley, MD [facility] Pager [phone]  If 7PM-7AM, please contact night-coverage [email or web address] Password [email or web address] 06/22/2016, 2:46 PM

## 2016-06-23 LAB — BASIC METABOLIC PANEL
Anion gap: 10 (ref 5–15)
BUN: 32 mg/dL — AB (ref 6–20)
CHLORIDE: 103 mmol/L (ref 101–111)
CO2: 25 mmol/L (ref 22–32)
CREATININE: 0.65 mg/dL (ref 0.44–1.00)
Calcium: 8.4 mg/dL — ABNORMAL LOW (ref 8.9–10.3)
GFR calc Af Amer: >60 mL/min (ref >60)
GFR calc non Af Amer: >60 mL/min (ref >60)
GLUCOSE: 144 mg/dL — AB (ref 65–99)
POTASSIUM: 3.8 mmol/L (ref 3.5–5.1)
Sodium: 138 mmol/L (ref 135–145)

## 2016-06-23 MED ORDER — POTASSIUM CITRATE ER 10 MEQ (1080 MG) PO TBCR
40.0000 meq | EXTENDED_RELEASE_TABLET | Freq: Every day | ORAL | Status: DC
  Administered 2016-06-23: 40 meq via ORAL
  Filled 2016-06-23: qty 4

## 2016-06-23 MED ORDER — AMOXICILLIN-POT CLAVULANATE 875-125 MG PO TABS: 1.0000 | ORAL_TABLET | Freq: Two times a day (BID) | ORAL | 0 refills | Status: AC

## 2016-06-23 MED ORDER — AMOXICILLIN-POT CLAVULANATE 875-125 MG PO TABS
1.0000 | ORAL_TABLET | Freq: Two times a day (BID) | ORAL | Status: DC
  Administered 2016-06-23: 1 via ORAL
  Filled 2016-06-23: qty 1

## 2016-06-23 MED ORDER — POTASSIUM CITRATE ER 10 MEQ (1080 MG) PO TBCR: 40.0000 meq | EXTENDED_RELEASE_TABLET | Freq: Every day | ORAL | 0 refills | Status: AC

## 2016-06-23 MED ORDER — METOPROLOL TARTRATE 25 MG PO TABS: 50.0000 mg | ORAL_TABLET | Freq: Two times a day (BID) | ORAL | 0 refills | Status: AC

## 2016-06-23 MED ORDER — ASPIRIN 81 MG PO TBEC: 81.0000 mg | DELAYED_RELEASE_TABLET | Freq: Every day | ORAL | 0 refills | Status: AC

## 2016-06-23 NOTE — Progress Notes (Signed)
Patient has been weaned off of oxygen. She is stable around 94-95% on room air.

## 2016-06-23 NOTE — Care Management Note (Signed)
Case Management Note Marvetta Gibbons RN, BSN Unit 2W-Case Manager [phone]  Patient Details  Name: [name] MRN: [id number] Date of Birth: 07/28/1927  Subjective/Objective:   Pt admitted with afib                 Action/Plan: PTA lived at [facility] consulted for d/c needs (return to ALF vs SNF) pt was active with [facility] at [facility] for HHRN/PT- if returns to ALF will need resumption orders for [facility] services-   Expected Discharge Date:    06/23/16              Expected Discharge Plan:  Assisted Living / Rest Home  In-House Referral:  Clinical Social Work  Discharge planning Services  CM Consult  Post Acute Care Choice:  Resumption of Svcs/PTA Provider, Home Health Choice offered to:  Sibling  DME Arranged:    DME Agency:     HH Arranged:  RN, PT [place] Agency:  [place]  Status of Service:  Completed, signed off  If discussed at [place] of Stay Meetings, dates discussed:    Discharge Disposition: Assisted Living with [facility]  Additional Comments:  06/23/16- 1140- [name] RN, CM- per CSW- pt to return to ALF- pt at baseline and can be cared for at ALF- MD to order resumption of HHRN/PT- have notified [name] with [facility] of pts return to ALF today with resumption of [place] services- CSW following for return to [facility]- memory care unit.   [name], RN 06/23/2016, 11:39 AM

## 2016-06-23 NOTE — Progress Notes (Signed)
PT Cancellation Note  Patient Details Name: KADELYN GOMPF MRN: [id number] DOB: [birth year]   Cancelled Treatment:    Reason Eval/Treat Not Completed: Other (comment) (attempted to see pt who remains confused and at this time refusing and combative with attempts at movement. Pt to D/C today and currently not able to participate)   Melford Aase 06/23/2016, 1:46 PM Elwyn Reach, [place]

## 2016-06-23 NOTE — Discharge Summary (Addendum)
Physician Discharge Summary  [name] [id number] DOB: Aug 24, 1927 DOA: 06/17/2016  PCP: Cari Caraway, MD  Admit date: 06/17/2016 Discharge date: 06/23/2016  Admitted From: ALF Disposition; ALF  Recommendations for Outpatient Follow-up:  1. Follow up with PCP in 1-2 weeks 2. Please obtain BMP/CBC in one week 3. Please follow final repeated blood culture     Discharge Condition: stable.  CODE STATUS: DNR Diet recommendation: Heart Healthy  Brief/Interim Summary: Erika Deflorio Curryis a 82 y.o.femalewith medical history significant of HTN, hypothyroidism, breast CA (remote) presenting with SOB, urinary symptoms. She is a poor historian and is unaccompanied. She reports SOB yesterday. Off and on for a week or more. Lots of pain and anxiety. Pain in abdomen. Denies urinary symptoms. Denies cough.   Per ER physician: Patient presents emergency department for assessment of shortness of breath and presents via EMS after there was concerns at nursing facility the patient has had a worsening of known CHF. Patient has been refusing doses of Lasix per documentation on MARtransfer sheet. Patient has no other symptomatic endorsements at this time except for foul-smelling urine with discomfort upon urination. Patient doses no falls no other track injuries denies chest pain or shortness of breath at this time. Denies fevers chills denies abdominal pain or back pain.  Per endorsements from care facility patient is having fatigue increased from baseline and his week that is described as generalized and not focal and causing patient to be unable to have the strength to ambulate which at her baseline includes walker and cane.   ED Course:Per Dr. Freda Munro: Patient presents emergency department in A. fib with RVR to 130s however presents without signs of cardiogenic shock or other perfusion impairment signed. Patient is afebrile per rectal exam has no other concerning vital signs and concern  for sepsis at this time. Patient is not on anticoagulation and has known history of paroxysmal atrial fibrillation and has been seen by cardiology in the past discussion was had regarding no anticoagulation. Has documented metoprolol and Lasix in chart however per documentation from nursing home patient is not been receiving Lasix and has been refusing doses.  Genitourinary endorsement's screening laboratory work along with coagulation profile and urine catheterized sample were obtained within an hour to look for sources of infection or RVR in setting of known atrial fibrillation.    Assessment & Plan: Afib with RVR -Responded to IV lopressor given in ER -IV Lopressor continued prn -Does not appear to be taking anticoagulant, including ASA -hr increase this am, increased metoprolol to 50 mg BID on 09-30>  -mild elevation of troponin, suspect related to a fib.  -hr better controlled .   Sepsis; presents with fever, tachycardia, tachypnea, source UTI. Now also with Bacteremia. E Coli.  Bacteremia.  Continue with IV antibiotics, ceftriaxone.  Repeat blood culture 09-30 no growth to date.   Acute hypoxic Respiratory Failure; Acute diastolic HF;  Suspect pulmonary edema. Received Lasix IV 40 mg BID. Change to home dose oral.  Daily weight.  Weight 69 kg.  Improved.   UTI, Bacteremia gram negative  -E coli bacteremia, antibiotics change to ceftriaxone, which patient has tolerated in the past.  Continue with ceftriaxone, day 6 antibiotics. Repeat blood culture no growth to date.  .  Needs 2 weeks of treatment total. Discharge on augmentin for 7 more days. .   Abdominal distension;   KUB;  Colonic ielum. Per sister patient has history of this, and has decline further evaluation in the past  Had BM last  night   AMS, Dementia.  -Suspect mild dementia -Also with apparent underlying psychiatric disturbance as the patient takes Risperdal, Zoloft.  -improved.    Hypothyroidism -Normal TSH/free T4 -Patient takes Armour Thyroid which is no longer recommended -However, it seems to be working so no changes at this time  HTN -hold  Lisinopril    Discharge Diagnoses:  Principal Problem:   Atrial fibrillation with rapid ventricular response, paroxysmal Active Problems:   Weakness generalized   Hypothyroid   Altered mental status   UTI (lower urinary tract infection)   Essential hypertension    Discharge Instructions  Discharge Instructions    Diet - low sodium heart healthy    Complete by:  As directed    Increase activity slowly    Complete by:  As directed        Medication List    STOP taking these medications   amoxicillin 500 MG capsule Commonly known as:  AMOXIL   lisinopril 2.5 MG tablet Commonly known as:  PRINIVIL,ZESTRIL   loperamide 1 MG/5ML solution Commonly known as:  IMODIUM   potassium chloride SA 20 MEQ tablet Commonly known as:  K-DUR,KLOR-CON     TAKE these medications   acetaminophen 500 MG tablet Commonly known as:  TYLENOL Take 1,000 mg by mouth 2 (two) times daily.   amoxicillin-clavulanate 875-125 MG tablet Commonly known as:  AUGMENTIN Take 1 tablet by mouth every 12 (twelve) hours.   aspirin 81 MG EC tablet Take 1 tablet (81 mg total) by mouth daily.   cholecalciferol 1000 units tablet Commonly known as:  VITAMIN D Take 2,000 Units by mouth daily.   famotidine 20 MG tablet Commonly known as:  PEPCID Take 20 mg by mouth at bedtime.   furosemide 20 MG tablet Commonly known as:  LASIX Take 60 mg by mouth daily.   Melatonin 3 MG Tabs Take 3 mg by mouth at bedtime.   menthol-zinc oxide powder Apply 1 application topically daily as needed (itching).   metoprolol tartrate 25 MG tablet Commonly known as:  LOPRESSOR Take 2 tablets (50 mg total) by mouth 2 (two) times daily. What changed:  how much to take   MYRBETRIQ 50 MG Tb24 tablet Generic drug:  mirabegron ER Take 50 mg  by mouth daily.   nystatin cream Commonly known as:  MYCOSTATIN Apply 1 application topically daily as needed (for rash until gone).   polyethylene glycol packet Commonly known as:  MIRALAX / GLYCOLAX Take 17 g by mouth 2 (two) times daily.   potassium citrate 10 MEQ (1080 MG) SR tablet Commonly known as:  UROCIT-K Take 4 tablets (40 mEq total) by mouth daily.   risperiDONE 1 MG/ML oral solution Commonly known as:  RISPERDAL Take 1 mg by mouth at bedtime.   sertraline 20 MG/ML concentrated solution Commonly known as:  ZOLOFT Take 150 mg by mouth daily. Takes 7.40ml   simethicone 40 MG/0.6ML drops Commonly known as:  MYLICON Take 123XX123 mg by mouth 2 (two) times daily.   thyroid 60 MG tablet Commonly known as:  ARMOUR THYROID Take 2 tablets (120 mg total) by mouth daily before breakfast. What changed:  how much to take  additional instructions       Allergies  Allergen Reactions  . Cephalexin Nausea And Vomiting  . Linzess [Linaclotide] Other (See Comments)    Fatigue and muscle weakness  . Latex Rash    Skin rash with bandaids and tapes  . Tape Rash    Reaction to  adhesive tape  . Ultram [Tramadol Hcl] Nausea Only    Consultations:  none   Procedures/Studies: Dg Chest 2 View  Result Date: 06/17/2016 CLINICAL DATA:  Shortness of breath and hypertension EXAM: CHEST  2 VIEW COMPARISON:  Chest radiograph 04/01/2015 FINDINGS: There is shallow lung inflation. There is atherosclerotic calcification within the thoracic aorta. There are bilateral small pleural effusions, right greater than left. There is central pulmonary vascular congestion and streaky right basilar opacities. No pneumothorax. Left axillary clips are noted. Lower thoracic compression fracture is again seen. IMPRESSION: 1. Small chronic bilateral pleural effusions, right greater left. 2. Shallow lung inflation with possible mild pulmonary edema. 3. Bibasilar atelectasis, right greater than left. 4. Aortic  atherosclerosis. Electronically Signed   By: Ulyses Jarred M.D.   On: 06/17/2016 20:23   Dg Abd 1 View  Result Date: 06/18/2016 CLINICAL DATA:  Abdominal distention EXAM: ABDOMEN - 1 VIEW COMPARISON:  February 28, 2015 FINDINGS: There is mild colonic dilatation, primarily in the cecal region. No air-fluid levels. No free air. There is calcification in the aorta and iliac arteries. A central calcification in the pelvis likely represents a small calcified uterine leiomyoma. IMPRESSION: Findings felt to be indicative of a degree of colonic ileus, most notably in the cecal region. No obstruction evident. No free air. Probable calcified uterine leiomyoma mid-pelvis. Aortoiliac atherosclerosis noted. Electronically Signed   By: Lowella Grip III M.D.   On: 06/18/2016 14:05       Subjective: Alert, pleasantly confuse.    Discharge Exam: Vitals:   06/23/16 0052 06/23/16 0601  BP: (!) 125/93 131/90  Pulse: (!) 123 (!) 106  Resp:  20  Temp:  98.1 F (36.7 C)   Vitals:   06/22/16 2028 06/23/16 0052 06/23/16 0601 06/23/16 0659  BP: 116/65 (!) 125/93 131/90   Pulse:  (!) 123 (!) 106   Resp: 20  20   Temp: 98.6 F (37 C)  98.1 F (36.7 C)   TempSrc: Oral  Oral   SpO2: 100%  99%   Weight:    63.2 kg (139 lb 4.8 oz)  Height:        General: Pt is alert, awake, not in acute distress Cardiovascular: RRR, S1/S2 +, no rubs, no gallops Respiratory: CTA bilaterally, no wheezing, no rhonchi Abdominal: Soft, NT, ND, bowel sounds + Extremities: no edema, no cyanosis    The results of significant diagnostics from this hospitalization (including imaging, microbiology, ancillary and laboratory) are listed below for reference.     Microbiology: Recent Results (from the past 240 hour(s))  Blood Culture (routine x 2)     Status: Abnormal   Collection Time: 06/17/16  4:41 PM  Result Value Ref Range Status   Specimen Description BLOOD RIGHT ANTECUBITAL  Final   Special Requests BOTTLES DRAWN  AEROBIC AND ANAEROBIC 5CC  Final   Culture  Setup Time   Final    GRAM NEGATIVE RODS ANAEROBIC BOTTLE ONLY CRITICAL RESULT CALLED TO, READ BACK BY AND VERIFIED WITH: Leonie Green PHARMD, AT [phone] 06/18/16 BY D.VANHOOK    Culture ESCHERICHIA COLI (A)  Final   Report Status 06/20/2016 FINAL  Final   Organism ID, Bacteria ESCHERICHIA COLI  Final      Susceptibility   Escherichia coli - MIC*    AMPICILLIN 4 SENSITIVE Sensitive     CEFAZOLIN <=4 SENSITIVE Sensitive     CEFEPIME <=1 SENSITIVE Sensitive     CEFTAZIDIME <=1 SENSITIVE Sensitive     CEFTRIAXONE <=1 SENSITIVE  Sensitive     CIPROFLOXACIN <=0.25 SENSITIVE Sensitive     GENTAMICIN <=1 SENSITIVE Sensitive     IMIPENEM <=0.25 SENSITIVE Sensitive     TRIMETH/SULFA <=20 SENSITIVE Sensitive     AMPICILLIN/SULBACTAM 4 SENSITIVE Sensitive     PIP/TAZO <=4 SENSITIVE Sensitive     Extended ESBL NEGATIVE Sensitive     * ESCHERICHIA COLI  Blood Culture ID Panel (Reflexed)     Status: Abnormal   Collection Time: 06/17/16  4:41 PM  Result Value Ref Range Status   Enterococcus species NOT DETECTED NOT DETECTED Final   Listeria monocytogenes NOT DETECTED NOT DETECTED Final   Staphylococcus species NOT DETECTED NOT DETECTED Final   Staphylococcus aureus NOT DETECTED NOT DETECTED Final   Streptococcus species NOT DETECTED NOT DETECTED Final   Streptococcus agalactiae NOT DETECTED NOT DETECTED Final   Streptococcus pneumoniae NOT DETECTED NOT DETECTED Final   Streptococcus pyogenes NOT DETECTED NOT DETECTED Final   Acinetobacter baumannii NOT DETECTED NOT DETECTED Final   Enterobacteriaceae species DETECTED (A) NOT DETECTED Final    Comment: CRITICAL RESULT CALLED TO, READ BACK BY AND VERIFIED WITH: Leonie Green PHARMD, AT [phone] 06/18/16 BY D.VANHOOK    Enterobacter cloacae complex NOT DETECTED NOT DETECTED Final   Escherichia coli DETECTED (A) NOT DETECTED Final    Comment: CRITICAL RESULT CALLED TO, READ BACK BY AND VERIFIED WITH: Leonie Green  PHARMD, AT [phone] 06/18/16 BY D.VANHOOK    Klebsiella oxytoca NOT DETECTED NOT DETECTED Final   Klebsiella pneumoniae NOT DETECTED NOT DETECTED Final   Proteus species NOT DETECTED NOT DETECTED Final   Serratia marcescens NOT DETECTED NOT DETECTED Final   Carbapenem resistance NOT DETECTED NOT DETECTED Final   Haemophilus influenzae NOT DETECTED NOT DETECTED Final   Neisseria meningitidis NOT DETECTED NOT DETECTED Final   Pseudomonas aeruginosa NOT DETECTED NOT DETECTED Final   Candida albicans NOT DETECTED NOT DETECTED Final   Candida glabrata NOT DETECTED NOT DETECTED Final   Candida krusei NOT DETECTED NOT DETECTED Final   Candida parapsilosis NOT DETECTED NOT DETECTED Final   Candida tropicalis NOT DETECTED NOT DETECTED Final  Gram stain     Status: None   Collection Time: 06/17/16  4:57 PM  Result Value Ref Range Status   Specimen Description IN/OUT CATH URINE  Final   Special Requests Normal  Final   Gram Stain   Final    WBC PRESENT, PREDOMINANTLY PMN GRAM VARIABLE ROD CYTOSPIN SMEAR    Report Status 06/17/2016 FINAL  Final  Urine culture     Status: Abnormal   Collection Time: 06/17/16  4:57 PM  Result Value Ref Range Status   Specimen Description IN/OUT CATH URINE  Final   Special Requests Normal  Final   Culture >=100,000 COLONIES/mL ESCHERICHIA COLI (A)  Final   Report Status 06/19/2016 FINAL  Final   Organism ID, Bacteria ESCHERICHIA COLI (A)  Final      Susceptibility   Escherichia coli - MIC*    AMPICILLIN 4 SENSITIVE Sensitive     CEFAZOLIN <=4 SENSITIVE Sensitive     CEFTRIAXONE <=1 SENSITIVE Sensitive     CIPROFLOXACIN <=0.25 SENSITIVE Sensitive     GENTAMICIN <=1 SENSITIVE Sensitive     IMIPENEM <=0.25 SENSITIVE Sensitive     NITROFURANTOIN <=16 SENSITIVE Sensitive     TRIMETH/SULFA <=20 SENSITIVE Sensitive     AMPICILLIN/SULBACTAM 4 SENSITIVE Sensitive     PIP/TAZO <=4 SENSITIVE Sensitive     Extended ESBL NEGATIVE Sensitive     * >=  100,000 COLONIES/mL  ESCHERICHIA COLI  Blood Culture (routine x 2)     Status: Abnormal   Collection Time: 06/17/16  5:27 PM  Result Value Ref Range Status   Specimen Description BLOOD RIGHT ANTECUBITAL  Final   Special Requests BOTTLES DRAWN AEROBIC AND ANAEROBIC 5CC  Final   Culture  Setup Time   Final    GRAM NEGATIVE RODS IN BOTH AEROBIC AND ANAEROBIC BOTTLES CRITICAL RESULT CALLED TO, READ BACK BY AND VERIFIED WITH: Leonie Green PHARMD, AT [phone] 06/18/16 BY D.VANHOOK    Culture (A)  Final    ESCHERICHIA COLI SUSCEPTIBILITIES PERFORMED ON PREVIOUS CULTURE WITHIN THE LAST 5 DAYS.    Report Status 06/20/2016 FINAL  Final  MRSA PCR Screening     Status: None   Collection Time: 06/18/16  2:07 AM  Result Value Ref Range Status   MRSA by PCR NEGATIVE NEGATIVE Final    Comment:        The GeneXpert MRSA Assay (FDA approved for NASAL specimens only), is one component of a comprehensive MRSA colonization surveillance program. It is not intended to diagnose MRSA infection nor to guide or monitor treatment for MRSA infections.   Culture, blood (routine x 2)     Status: None (Preliminary result)   Collection Time: 06/21/16 10:33 AM  Result Value Ref Range Status   Specimen Description BLOOD RIGHT ANTECUBITAL  Final   Special Requests BOTTLES DRAWN AEROBIC ONLY 5CC  Final   Culture NO GROWTH 1 DAY  Final   Report Status PENDING  Incomplete  Culture, blood (routine x 2)     Status: None (Preliminary result)   Collection Time: 06/21/16 10:45 AM  Result Value Ref Range Status   Specimen Description BLOOD RIGHT ANTECUBITAL  Final   Special Requests BOTTLES DRAWN AEROBIC ONLY 5CC  Final   Culture NO GROWTH 1 DAY  Final   Report Status PENDING  Incomplete     Labs: BNP (last 3 results)  Recent Labs  06/17/16 1637  BNP [id number]*   Basic Metabolic Panel:  Recent Labs Lab 06/17/16 1637 06/18/16 0510 06/19/16 0758 06/20/16 0209 06/21/16 0456 06/21/16 1928 06/22/16 1459  NA 137 138 137 137 140  --   139  K 4.2 4.1 3.9 3.5 3.5  --  3.1*  CL 106 105 107 105 102  --  103  CO2 22 22 22 25 28   --  31  GLUCOSE 115* 128* 99 95 114*  --  126*  BUN 29* 30* 30* 28* 22*  --  25*  CREATININE 0.74 0.65 0.58 0.65 0.63  --  0.71  CALCIUM 8.8* 8.6* 8.5* 8.2* 8.6*  --  8.2*  MG 2.1  --   --   --   --  1.9  --   PHOS 3.5  --   --   --   --   --   --    Liver Function Tests: No results for input(s): AST, ALT, ALKPHOS, BILITOT, PROT, ALBUMIN in the last 168 hours. No results for input(s): LIPASE, AMYLASE in the last 168 hours. No results for input(s): AMMONIA in the last 168 hours. CBC:  Recent Labs Lab 06/17/16 1637 06/18/16 0510 06/19/16 0758 06/21/16 0456  WBC 9.4 9.3 7.0 7.9  HGB 11.5* 10.3* 10.4* 10.5*  HCT 36.4 32.9* 33.9* 33.4*  MCV 97.6 97.9 99.1 97.1  PLT 159 164 161 172   Cardiac Enzymes:  Recent Labs Lab 06/17/16 1637 06/17/16 2322 06/18/16 0510 06/18/16 1100  TROPONINI 0.18* 0.17* 0.25* 0.20*   BNP: Invalid input(s): POCBNP CBG: No results for input(s): GLUCAP in the last 168 hours. D-Dimer No results for input(s): DDIMER in the last 72 hours. Hgb A1c No results for input(s): HGBA1C in the last 72 hours. Lipid Profile No results for input(s): CHOL, HDL, LDLCALC, TRIG, CHOLHDL, LDLDIRECT in the last 72 hours. Thyroid function studies No results for input(s): TSH, T4TOTAL, T3FREE, THYROIDAB in the last 72 hours.  Invalid input(s): FREET3 Anemia work up No results for input(s): VITAMINB12, FOLATE, FERRITIN, TIBC, IRON, RETICCTPCT in the last 72 hours. Urinalysis    Component Value Date/Time   COLORURINE AMBER (A) 06/17/2016 1657   APPEARANCEUR CLOUDY (A) 06/17/2016 1657   LABSPEC 1.019 06/17/2016 1657   PHURINE 5.5 06/17/2016 1657   GLUCOSEU NEGATIVE 06/17/2016 1657   HGBUR LARGE (A) 06/17/2016 1657   BILIRUBINUR SMALL (A) 06/17/2016 1657   BILIRUBINUR neg 12/05/2011 1147   KETONESUR NEGATIVE 06/17/2016 1657   PROTEINUR 100 (A) 06/17/2016 1657    UROBILINOGEN 0.2 02/17/2015 1508   NITRITE NEGATIVE 06/17/2016 1657   LEUKOCYTESUR LARGE (A) 06/17/2016 1657   Sepsis Labs Invalid input(s): PROCALCITONIN,  WBC,  LACTICIDVEN Microbiology Recent Results (from the past 240 hour(s))  Blood Culture (routine x 2)     Status: Abnormal   Collection Time: 06/17/16  4:41 PM  Result Value Ref Range Status   Specimen Description BLOOD RIGHT ANTECUBITAL  Final   Special Requests BOTTLES DRAWN AEROBIC AND ANAEROBIC 5CC  Final   Culture  Setup Time   Final    GRAM NEGATIVE RODS ANAEROBIC BOTTLE ONLY CRITICAL RESULT CALLED TO, READ BACK BY AND VERIFIED WITH: Leonie Green PHARMD, AT [phone] 06/18/16 BY D.VANHOOK    Culture ESCHERICHIA COLI (A)  Final   Report Status 06/20/2016 FINAL  Final   Organism ID, Bacteria ESCHERICHIA COLI  Final      Susceptibility   Escherichia coli - MIC*    AMPICILLIN 4 SENSITIVE Sensitive     CEFAZOLIN <=4 SENSITIVE Sensitive     CEFEPIME <=1 SENSITIVE Sensitive     CEFTAZIDIME <=1 SENSITIVE Sensitive     CEFTRIAXONE <=1 SENSITIVE Sensitive     CIPROFLOXACIN <=0.25 SENSITIVE Sensitive     GENTAMICIN <=1 SENSITIVE Sensitive     IMIPENEM <=0.25 SENSITIVE Sensitive     TRIMETH/SULFA <=20 SENSITIVE Sensitive     AMPICILLIN/SULBACTAM 4 SENSITIVE Sensitive     PIP/TAZO <=4 SENSITIVE Sensitive     Extended ESBL NEGATIVE Sensitive     * ESCHERICHIA COLI  Blood Culture ID Panel (Reflexed)     Status: Abnormal   Collection Time: 06/17/16  4:41 PM  Result Value Ref Range Status   Enterococcus species NOT DETECTED NOT DETECTED Final   Listeria monocytogenes NOT DETECTED NOT DETECTED Final   Staphylococcus species NOT DETECTED NOT DETECTED Final   Staphylococcus aureus NOT DETECTED NOT DETECTED Final   Streptococcus species NOT DETECTED NOT DETECTED Final   Streptococcus agalactiae NOT DETECTED NOT DETECTED Final   Streptococcus pneumoniae NOT DETECTED NOT DETECTED Final   Streptococcus pyogenes NOT DETECTED NOT DETECTED Final    Acinetobacter baumannii NOT DETECTED NOT DETECTED Final   Enterobacteriaceae species DETECTED (A) NOT DETECTED Final    Comment: CRITICAL RESULT CALLED TO, READ BACK BY AND VERIFIED WITH: Leonie Green PHARMD, AT [phone] 06/18/16 BY D.VANHOOK    Enterobacter cloacae complex NOT DETECTED NOT DETECTED Final   Escherichia coli DETECTED (A) NOT DETECTED Final    Comment: CRITICAL RESULT CALLED  TO, READ BACK BY AND VERIFIED WITH: Leonie Green PHARMD, AT [phone] 06/18/16 BY D.VANHOOK    Klebsiella oxytoca NOT DETECTED NOT DETECTED Final   Klebsiella pneumoniae NOT DETECTED NOT DETECTED Final   Proteus species NOT DETECTED NOT DETECTED Final   Serratia marcescens NOT DETECTED NOT DETECTED Final   Carbapenem resistance NOT DETECTED NOT DETECTED Final   Haemophilus influenzae NOT DETECTED NOT DETECTED Final   Neisseria meningitidis NOT DETECTED NOT DETECTED Final   Pseudomonas aeruginosa NOT DETECTED NOT DETECTED Final   Candida albicans NOT DETECTED NOT DETECTED Final   Candida glabrata NOT DETECTED NOT DETECTED Final   Candida krusei NOT DETECTED NOT DETECTED Final   Candida parapsilosis NOT DETECTED NOT DETECTED Final   Candida tropicalis NOT DETECTED NOT DETECTED Final  Gram stain     Status: None   Collection Time: 06/17/16  4:57 PM  Result Value Ref Range Status   Specimen Description IN/OUT CATH URINE  Final   Special Requests Normal  Final   Gram Stain   Final    WBC PRESENT, PREDOMINANTLY PMN GRAM VARIABLE ROD CYTOSPIN SMEAR    Report Status 06/17/2016 FINAL  Final  Urine culture     Status: Abnormal   Collection Time: 06/17/16  4:57 PM  Result Value Ref Range Status   Specimen Description IN/OUT CATH URINE  Final   Special Requests Normal  Final   Culture >=100,000 COLONIES/mL ESCHERICHIA COLI (A)  Final   Report Status 06/19/2016 FINAL  Final   Organism ID, Bacteria ESCHERICHIA COLI (A)  Final      Susceptibility   Escherichia coli - MIC*    AMPICILLIN 4 SENSITIVE Sensitive      CEFAZOLIN <=4 SENSITIVE Sensitive     CEFTRIAXONE <=1 SENSITIVE Sensitive     CIPROFLOXACIN <=0.25 SENSITIVE Sensitive     GENTAMICIN <=1 SENSITIVE Sensitive     IMIPENEM <=0.25 SENSITIVE Sensitive     NITROFURANTOIN <=16 SENSITIVE Sensitive     TRIMETH/SULFA <=20 SENSITIVE Sensitive     AMPICILLIN/SULBACTAM 4 SENSITIVE Sensitive     PIP/TAZO <=4 SENSITIVE Sensitive     Extended ESBL NEGATIVE Sensitive     * >=100,000 COLONIES/mL ESCHERICHIA COLI  Blood Culture (routine x 2)     Status: Abnormal   Collection Time: 06/17/16  5:27 PM  Result Value Ref Range Status   Specimen Description BLOOD RIGHT ANTECUBITAL  Final   Special Requests BOTTLES DRAWN AEROBIC AND ANAEROBIC 5CC  Final   Culture  Setup Time   Final    GRAM NEGATIVE RODS IN BOTH AEROBIC AND ANAEROBIC BOTTLES CRITICAL RESULT CALLED TO, READ BACK BY AND VERIFIED WITH: Leonie Green PHARMD, AT [phone] 06/18/16 BY D.VANHOOK    Culture (A)  Final    ESCHERICHIA COLI SUSCEPTIBILITIES PERFORMED ON PREVIOUS CULTURE WITHIN THE LAST 5 DAYS.    Report Status 06/20/2016 FINAL  Final  MRSA PCR Screening     Status: None   Collection Time: 06/18/16  2:07 AM  Result Value Ref Range Status   MRSA by PCR NEGATIVE NEGATIVE Final    Comment:        The GeneXpert MRSA Assay (FDA approved for NASAL specimens only), is one component of a comprehensive MRSA colonization surveillance program. It is not intended to diagnose MRSA infection nor to guide or monitor treatment for MRSA infections.   Culture, blood (routine x 2)     Status: None (Preliminary result)   Collection Time: 06/21/16 10:33 AM  Result Value Ref Range  Status   Specimen Description BLOOD RIGHT ANTECUBITAL  Final   Special Requests BOTTLES DRAWN AEROBIC ONLY 5CC  Final   Culture NO GROWTH 1 DAY  Final   Report Status PENDING  Incomplete  Culture, blood (routine x 2)     Status: None (Preliminary result)   Collection Time: 06/21/16 10:45 AM  Result Value Ref Range Status    Specimen Description BLOOD RIGHT ANTECUBITAL  Final   Special Requests BOTTLES DRAWN AEROBIC ONLY 5CC  Final   Culture NO GROWTH 1 DAY  Final   Report Status PENDING  Incomplete     Time coordinating discharge: Over 30 minutes  SIGNED:   Elmarie Shiley, MD  [facility] 06/23/2016, 9:32 AM Pager [phone]  If 7PM-7AM, please contact night-coverage [email or web address] Password [email or web address]

## 2016-06-23 NOTE — Progress Notes (Signed)
Patient discharged by ambulance to [facility]. Report was given to [facility]. IV was DC'd and was intact. Paper prescriptions were sent with her.

## 2016-06-23 NOTE — Care Management Important Message (Signed)
Important Message  Patient Details  Name: [name] MRN: [id number] Date of Birth: [birth year]   Medicare Important Message Given:  Yes    [name] 06/23/2016, 12:45 PM

## 2016-06-23 NOTE — ED Provider Notes (Signed)
[facility] DEPT Provider Note  CSN: [id number] Arrival Date & Time: 06/17/16 @ 57  History    Chief Complaint Chief Complaint  Patient presents with  . Shortness of Breath  . Dysuria    HPI [name] is a 82 y.o. female.  Patient presents emergency department for assessment of shortness of breath and presents via EMS after there was concerns at nursing facility the patient has had a worsening of known CHF. Patient has been refusing doses of Lasix per documentation on MAR transfer sheet. Patient has no other symptomatic endorsements at this time except for foul-smelling urine with discomfort upon urination. Patient doses no falls no other track injuries denies chest pain or shortness of breath at this time. Denies fevers chills denies abdominal pain or back pain.  Per endorsements from care facility patient is having fatigue increased from baseline and his week that is described as generalized and not focal and causing patient to be unable to have the strength to ambulate which at her baseline includes walker and cane.  Past Medical & Surgical History    Past Medical History:  Diagnosis Date  . Atrial fibrillation ([place])   . Breast cancer, left breast ([place])   . Cancer ([place])   . Collagen vascular disease ([place])   . Hypertension   . MVP (mitral valve prolapse)   . Osteoporosis   . Thyroid disease    Patient Active Problem List   Diagnosis Date Noted  . Essential hypertension 06/18/2016  . Ileus ([place]) 02/23/2015  . Subacute delirium 02/21/2015  . Protein-calorie malnutrition, severe ([place]) 02/21/2015  . UTI (lower urinary tract infection) 02/17/2015  . Hypothyroid 12/19/2014  . Atrial fibrillation with rapid ventricular response, paroxysmal 12/19/2014  . SVT (supraventricular tachycardia) ([place]) 12/19/2014  . Hypokalemia 12/19/2014  . Altered mental status   . Bleeding gastrointestinal   . GI bleeding 12/18/2014  . Varicose veins of lower extremities with ulcer  ([place]) 08/02/2014  . Varicose veins of lower extremities with other complications 0000000  . Leg swelling 03/03/2012  . Weakness generalized 04/15/2011  . OSTEOARTHRITIS, SPINE, NOS 12/20/2009  . SACROILIITIS 06/05/2009  . CONTUSION OF MULTIPLE SITES NEC 06/05/2009  . CONSTIPATION 05/29/2009  . VERTEBROBASILAR ARTERY SYNDROME 03/30/2009  . NOSE FRACTURE 03/30/2009  . TARSAL TUNNEL SYNDROME, LEFT 02/12/2009  . OSTEOARTHRITIS, KNEES, BILATERAL 01/04/2009  . OSTEOARTHRITIS, HANDS, BILATERAL 12/21/2008  . ROTATOR CUFF SYNDROME, RIGHT 07/07/2008  . Vitamin D deficiency 03/09/2008  . CONTACT DERMATITIS&OTHER ECZEMA DUE TO PLANTS 03/09/2008  . Osteoporosis, unspecified 03/09/2008   Past Surgical History:  Procedure Laterality Date  . BREAST LUMPECTOMY    . ESOPHAGOGASTRODUODENOSCOPY N/A 12/19/2014   Procedure: ESOPHAGOGASTRODUODENOSCOPY (EGD);  Surgeon: Teena Irani, MD;  Location: [facility];  Service: Endoscopy;  Laterality: N/A;  . hemrrhoidectomy    . saline implant after left mastectomy      Family & Social History    Family History  Problem Relation Age of Onset  . Cancer Sister     breast    Social History  Substance Use Topics  . Smoking status: Former Smoker    Types: Cigarettes  . Smokeless tobacco: Never Used  . Alcohol use No    Home Medications    Prior to Admission medications   Medication Sig Start Date End Date Taking? Authorizing Provider  acetaminophen (TYLENOL) 500 MG tablet Take 1,000 mg by mouth 2 (two) times daily.   Yes Historical Provider, MD  cholecalciferol (VITAMIN D) 1000 UNITS tablet Take 2,000 Units  by mouth daily.    Yes Historical Provider, MD  famotidine (PEPCID) 20 MG tablet Take 20 mg by mouth at bedtime.   Yes Historical Provider, MD  furosemide (LASIX) 20 MG tablet Take 60 mg by mouth daily.   Yes Historical Provider, MD  Melatonin 3 MG TABS Take 3 mg by mouth at bedtime.   Yes Historical Provider, MD  menthol-zinc oxide (GOLD BOND)  powder Apply 1 application topically daily as needed (itching).   Yes Historical Provider, MD  mirabegron ER (MYRBETRIQ) 50 MG TB24 tablet Take 50 mg by mouth daily.   Yes Historical Provider, MD  nystatin cream (MYCOSTATIN) Apply 1 application topically daily as needed (for rash until gone).   Yes Historical Provider, MD  polyethylene glycol (MIRALAX / GLYCOLAX) packet Take 17 g by mouth 2 (two) times daily.   Yes Historical Provider, MD  risperiDONE (RISPERDAL) 1 MG/ML oral solution Take 1 mg by mouth at bedtime.   Yes Historical Provider, MD  sertraline (ZOLOFT) 20 MG/ML concentrated solution Take 150 mg by mouth daily. Takes 7.37ml   Yes Historical Provider, MD  simethicone (MYLICON) 40 [id number] drops Take 100 mg by mouth 2 (two) times daily.   Yes Historical Provider, MD  thyroid (ARMOUR THYROID) 60 MG tablet Take 2 tablets (120 mg total) by mouth daily before breakfast. Patient taking differently: Take 60-120 mg by mouth daily before breakfast. Takes 120mg  everyday except 60mg  on Saturday's 03/02/15  Yes Florencia Reasons, MD  amoxicillin-clavulanate (AUGMENTIN) 875-125 MG tablet Take 1 tablet by mouth every 12 (twelve) hours. 06/23/16   Belkys A Regalado, MD  aspirin EC 81 MG EC tablet Take 1 tablet (81 mg total) by mouth daily. 06/23/16   Belkys A Regalado, MD  metoprolol tartrate (LOPRESSOR) 25 MG tablet Take 2 tablets (50 mg total) by mouth 2 (two) times daily. 06/23/16   Belkys A Regalado, MD  potassium citrate (UROCIT-K) 10 MEQ (1080 MG) SR tablet Take 4 tablets (40 mEq total) by mouth daily. 06/23/16   Belkys A Regalado, MD    Allergies    Cephalexin; Linzess [linaclotide]; Latex; Tape; and Ultram [tramadol hcl]  I reviewed & agree with nursing's documentation on the patient's past medical, surgical, social & family histories as well as their allergies.  Review of Systems  Complete 14 point ROS completed and negative except for: Constitutional: Positive for fatigue.  Genitourinary: Positive for  dysuria, frequency and urgency.   Physical Exam  Updated Vital Signs BP (!) 132/97   Pulse (!) 111   Temp 98.1 F (36.7 C) (Oral)   Resp 20   Ht 5\' 7"  (1.702 m)   Wt 63.2 kg   SpO2 99%   BMI 21.82 kg/m  I have reviewed the triage vital signs and the nursing notes. Physical Exam Physical Exam  Constitutional: She is oriented to person, place, and time. She appears well-developed and well-nourished.  Non-toxic appearance. She does not appear ill. No distress.  HENT:  Head: Normocephalic and atraumatic.  Right Ear: External ear normal.  Left Ear: External ear normal.  Mouth/Throat: Oropharynx is clear and moist.  Eyes: EOM are normal. Pupils are equal, round, and reactive to light. No scleral icterus.  Neck: Normal range of motion. Neck supple. No JVD present. No tracheal deviation present. No thyromegaly present.  Cardiovascular: Normal heart sounds and intact distal pulses.   No murmur heard. Irregular  Pulmonary/Chest: Effort normal and breath sounds normal. No stridor. No respiratory distress. She has no wheezes. She has  no rales.  Abdominal: Soft. Bowel sounds are normal. She exhibits distension. She exhibits no mass. There is no tenderness. There is no rebound and no guarding. No hernia.  Genitourinary:  Genitourinary Comments: Flank pain bilaterally absent.  Musculoskeletal: Normal range of motion. She exhibits no edema, tenderness or deformity.  Neurological: She is alert and oriented to person, place, and time. She has normal strength and normal reflexes. No cranial nerve deficit or sensory deficit. She exhibits normal muscle tone. Coordination normal.  Skin: Skin is warm and dry. Capillary refill takes less than 2 seconds. No rash noted. She is not diaphoretic. No pallor.  Psychiatric: She has a normal mood and affect. Her behavior is normal.  Nursing note and vitals reviewed.  ED Treatments & Results   Labs (only abnormal results are displayed) Labs Reviewed  URINE  CULTURE - Abnormal; Notable for the following:       Result Value   Culture >=100,000 COLONIES/mL ESCHERICHIA COLI (*)    Organism ID, Bacteria ESCHERICHIA COLI (*)    All other components within normal limits  CULTURE, BLOOD (ROUTINE X 2) - Abnormal; Notable for the following:    Culture ESCHERICHIA COLI (*)    All other components within normal limits  CULTURE, BLOOD (ROUTINE X 2) - Abnormal; Notable for the following:    Culture   (*)    Value: ESCHERICHIA COLI SUSCEPTIBILITIES PERFORMED ON PREVIOUS CULTURE WITHIN THE LAST 5 DAYS.    All other components within normal limits  BLOOD CULTURE ID PANEL (REFLEXED) - Abnormal; Notable for the following:    Enterobacteriaceae species DETECTED (*)    Escherichia coli DETECTED (*)    All other components within normal limits  CBC - Abnormal; Notable for the following:    RBC 3.73 (*)    Hemoglobin 11.5 (*)    All other components within normal limits  BRAIN NATRIURETIC PEPTIDE - Abnormal; Notable for the following:    B Natriuretic Peptide 1,612.7 (*)    All other components within normal limits  TROPONIN I - Abnormal; Notable for the following:    Troponin I 0.18 (*)    All other components within normal limits  PROTIME-INR - Abnormal; Notable for the following:    Prothrombin Time 15.8 (*)    All other components within normal limits  BASIC METABOLIC PANEL - Abnormal; Notable for the following:    Glucose, Bld 115 (*)    BUN 29 (*)    Calcium 8.8 (*)    All other components within normal limits  URINALYSIS, ROUTINE W REFLEX MICROSCOPIC (NOT AT [facility]) - Abnormal; Notable for the following:    Color, Urine AMBER (*)    APPearance CLOUDY (*)    Hgb urine dipstick LARGE (*)    Bilirubin Urine SMALL (*)    Protein, ur 100 (*)    Leukocytes, UA LARGE (*)    All other components within normal limits  URINE MICROSCOPIC-ADD ON - Abnormal; Notable for the following:    Squamous Epithelial / LPF 0-5 (*)    Bacteria, UA MANY (*)    All  other components within normal limits  PROTIME-INR - Abnormal; Notable for the following:    Prothrombin Time 16.0 (*)    All other components within normal limits  TROPONIN I - Abnormal; Notable for the following:    Troponin I 0.17 (*)    All other components within normal limits  TROPONIN I - Abnormal; Notable for the following:    Troponin I  0.25 (*)    All other components within normal limits  TROPONIN I - Abnormal; Notable for the following:    Troponin I 0.20 (*)    All other components within normal limits  BASIC METABOLIC PANEL - Abnormal; Notable for the following:    Glucose, Bld 128 (*)    BUN 30 (*)    Calcium 8.6 (*)    All other components within normal limits  CBC - Abnormal; Notable for the following:    RBC 3.36 (*)    Hemoglobin 10.3 (*)    HCT 32.9 (*)    All other components within normal limits  CBC - Abnormal; Notable for the following:    RBC 3.42 (*)    Hemoglobin 10.4 (*)    HCT 33.9 (*)    All other components within normal limits  BASIC METABOLIC PANEL - Abnormal; Notable for the following:    BUN 30 (*)    Calcium 8.5 (*)    All other components within normal limits  BASIC METABOLIC PANEL - Abnormal; Notable for the following:    BUN 28 (*)    Calcium 8.2 (*)    All other components within normal limits  CBC - Abnormal; Notable for the following:    RBC 3.44 (*)    Hemoglobin 10.5 (*)    HCT 33.4 (*)    All other components within normal limits  BASIC METABOLIC PANEL - Abnormal; Notable for the following:    Glucose, Bld 114 (*)    BUN 22 (*)    Calcium 8.6 (*)    All other components within normal limits  BASIC METABOLIC PANEL - Abnormal; Notable for the following:    Potassium 3.1 (*)    Glucose, Bld 126 (*)    BUN 25 (*)    Calcium 8.2 (*)    All other components within normal limits  BASIC METABOLIC PANEL - Abnormal; Notable for the following:    Glucose, Bld 144 (*)    BUN 32 (*)    Calcium 8.4 (*)    All other components  within normal limits  GRAM STAIN  MRSA PCR SCREENING  CULTURE, BLOOD (ROUTINE X 2)  CULTURE, BLOOD (ROUTINE X 2)  APTT  MAGNESIUM  PHOSPHORUS  TSH  T4, FREE  LACTIC ACID, PLASMA  LACTIC ACID, PLASMA  PROCALCITONIN  APTT  MAGNESIUM  I-STAT CG4 LACTIC ACID, ED    EKG    EKG Interpretation  Date/Time:  Tuesday June 17 2016 15:58:49 EDT Ventricular Rate:  133 PR Interval:    QRS Duration: 81 QT Interval:  327 QTC Calculation: 487 R Axis:   -55 Text Interpretation:  Atrial fibrillation Left axis deviation Anterior infarct, old Repolarization abnormality, prob rate related No significant change since last tracing Confirmed by Winfred Leeds  MD, SAM [phone]) on 06/17/2016 4:05:28 PM       Radiology No results found.  Pertinent labs & imaging results that were available during my care of the patient were independently visualized by me and considered in my medical decision making, please see chart for details.  Procedures (including critical care time) Procedures  Medications Ordered in ED Medications  sodium chloride 0.9 % bolus 250 mL (0 mLs Intravenous Stopped 06/17/16 1809)  ceFEPIme (MAXIPIME) 2 g in dextrose 5 % 50 mL IVPB (0 g Intravenous Stopped 06/17/16 1900)  aspirin chewable tablet 324 mg (324 mg Oral Given 06/17/16 1807)  sodium chloride 0.9 % bolus 2,013 mL (0 mLs Intravenous Stopped 06/17/16 2144)  metoprolol (LOPRESSOR) injection 5 mg (5 mg Intravenous Given 06/17/16 1836)  levofloxacin (LEVAQUIN) IVPB 750 mg (750 mg Intravenous Given 06/17/16 2318)  aztreonam (AZACTAM) 2 g in dextrose 5 % 50 mL IVPB (2 g Intravenous Given 06/18/16 0506)  LORazepam (ATIVAN) tablet 0.25 mg (0.25 mg Oral Given 06/18/16 0059)  morphine 2 MG/ML injection 2 mg (2 mg Intravenous Given 06/18/16 0245)  LORazepam (ATIVAN) injection 0.25 mg (0.25 mg Intravenous Given 06/19/16 1029)  potassium chloride SA (K-DUR,KLOR-CON) CR tablet 40 mEq (40 mEq Oral Given 06/19/16 1752)  potassium chloride  SA (K-DUR,KLOR-CON) CR tablet 40 mEq (40 mEq Oral Given 06/20/16 1808)    Initial Impression & Plan / ED Course & Results / Final Disposition   Initial Impression & Plan Patient presents emergency department in A. fib with RVR to 130s however presents without signs of cardiogenic shock or other perfusion impairment signed. Patient is not on anticoagulation and has known history of paroxysmal atrial fibrillation and has been seen by cardiology in the past discussion was had regarding no anticoagulation. Has documented metoprolol and Lasix in chart however per documentation from nursing home patient is not been receiving Lasix and has been refusing doses. Genitourinary endorsement's screening laboratory work along with coagulation profile and urine catheterized sample were obtained within an hour to look for sources of infection or RVR in setting of known atrial fibrillation. Upon my review of EKG EKG is similar to previous ECGs obtained I do not have concern or appreciation of acute STEMI at this time or other ischemic changes. Patient also has no chest pain and endorses no noticeable shortness breath.  Patient is febrile per rectal exam and therefore in light of the patient's vitals & clinical picture, I initiated a "Code Sepsis" due to concern for serious infectious process. I obtained rapid IV access along w/ screening labs, serial lactates, multiple blood cultures, urine studies/cultures and CXR imaging of the chest to look for likely sources of infection.  IVF bolus provided to 30 ml/kg and IV cefepime given for likely Urinary source.  Patient has no new oxygen requirement and worse nausea baseline. He has no lung findings concerning for pneumonia or COPD or asthma and does not have history of such.  Final Disposition Reassessment of the patient reveals IV metoprolol results in HR control and will hold lasix in light of concern for sepsis. Patient also needs reassessment of AC consideration given  risk factors which are significant. In light of above & the complexity of the patient's medical condition, I believe the patient will require admission for continued medical intervention. ED Course in its entirety was reviewed w/ the patient and relative(s). I therefore consulted the Hospitalist Service for admission & we discussed the patient's ED course & they have agreed to admit. They request no further interventions prior to the patient's transport from the ED. Patient stable for transport. Level of Care determined by the Admitting Service.   Final Clinical Impression & ED Diagnoses   1. UTI (lower urinary tract infection)   2. Abdominal distension   3. Muscle weakness (generalized)    Patient care discussed with the attending physician, Dr. Winfred Leeds, who oversaw their evaluation & treatment & voiced agreement.  Note: This document was prepared using Dragon voice recognition software and may include unintentional dictation errors.  House Officer: Voncille Lo, MD, Emergency Medicine Resident.   Voncille Lo, MD 06/23/16 2115    Orlie Dakin, MD 06/24/16 0001

## 2016-06-26 LAB — CULTURE, BLOOD (ROUTINE X 2)
CULTURE: NO GROWTH
Culture: NO GROWTH

## 2016-07-16 IMAGING — CR DG ABDOMEN ACUTE W/ 1V CHEST
3 series · 3 of 3 positions shown · non-contrast
Comparison: Abdomen x-rays 02/01/2015 and earlier. One view chest
x-ray 02/17/2015. CT abdomen and pelvis 09/26/2009.

CLINICAL DATA: 88-year-old admitted 3 days ago after an accidental
fall secondary to metabolic encephalopathy. Patient has subsequently
developed diarrhea. Patient also diagnosed with enterococcal
pyelonephritis.

EXAM:
DG ABDOMEN ACUTE W/ 1V CHEST

[x chest ap]
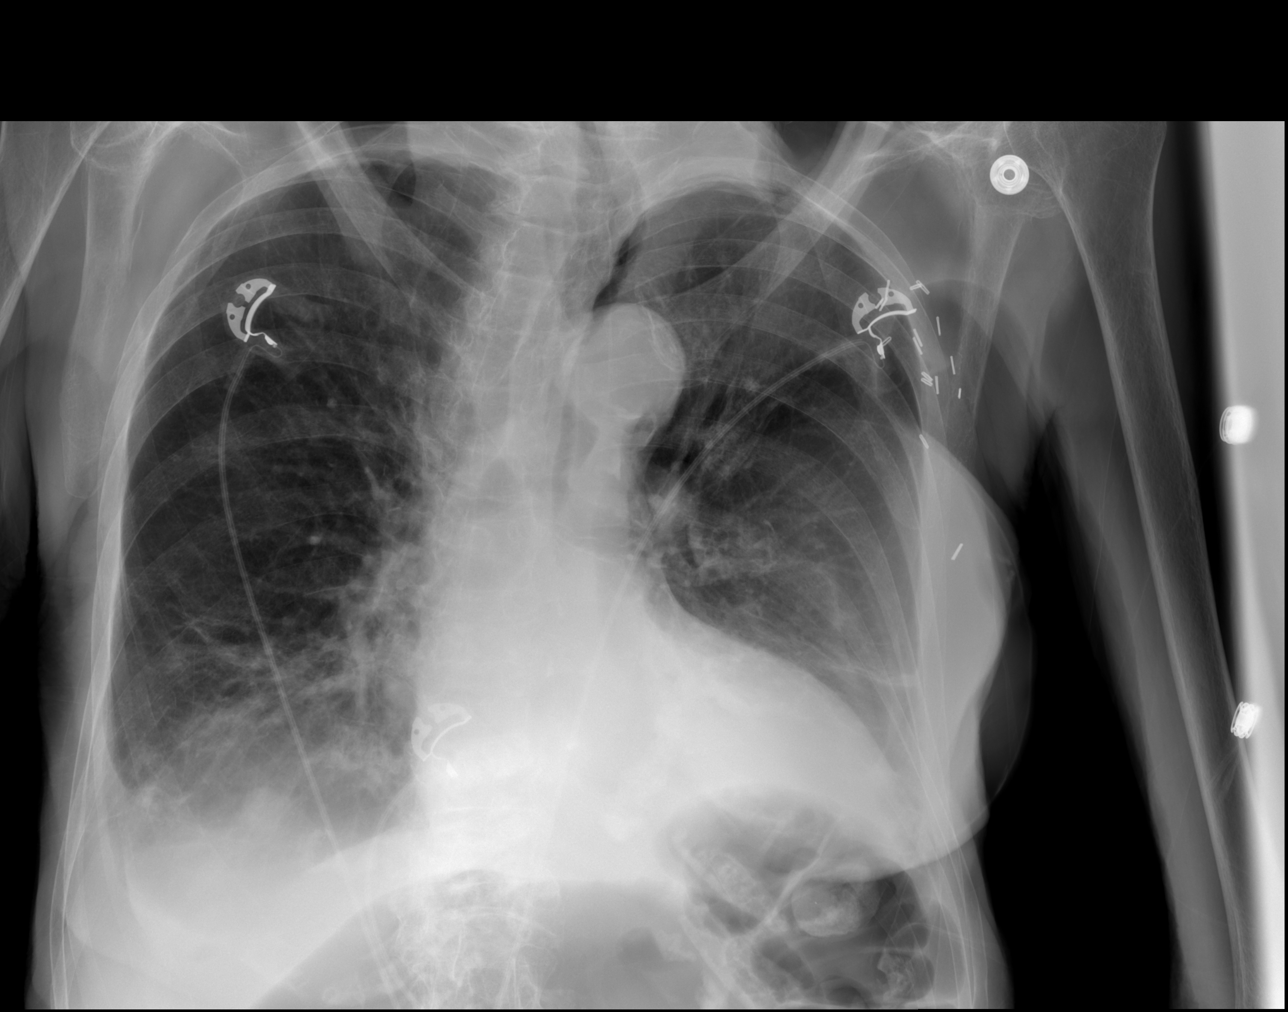

[x abdomen supine]
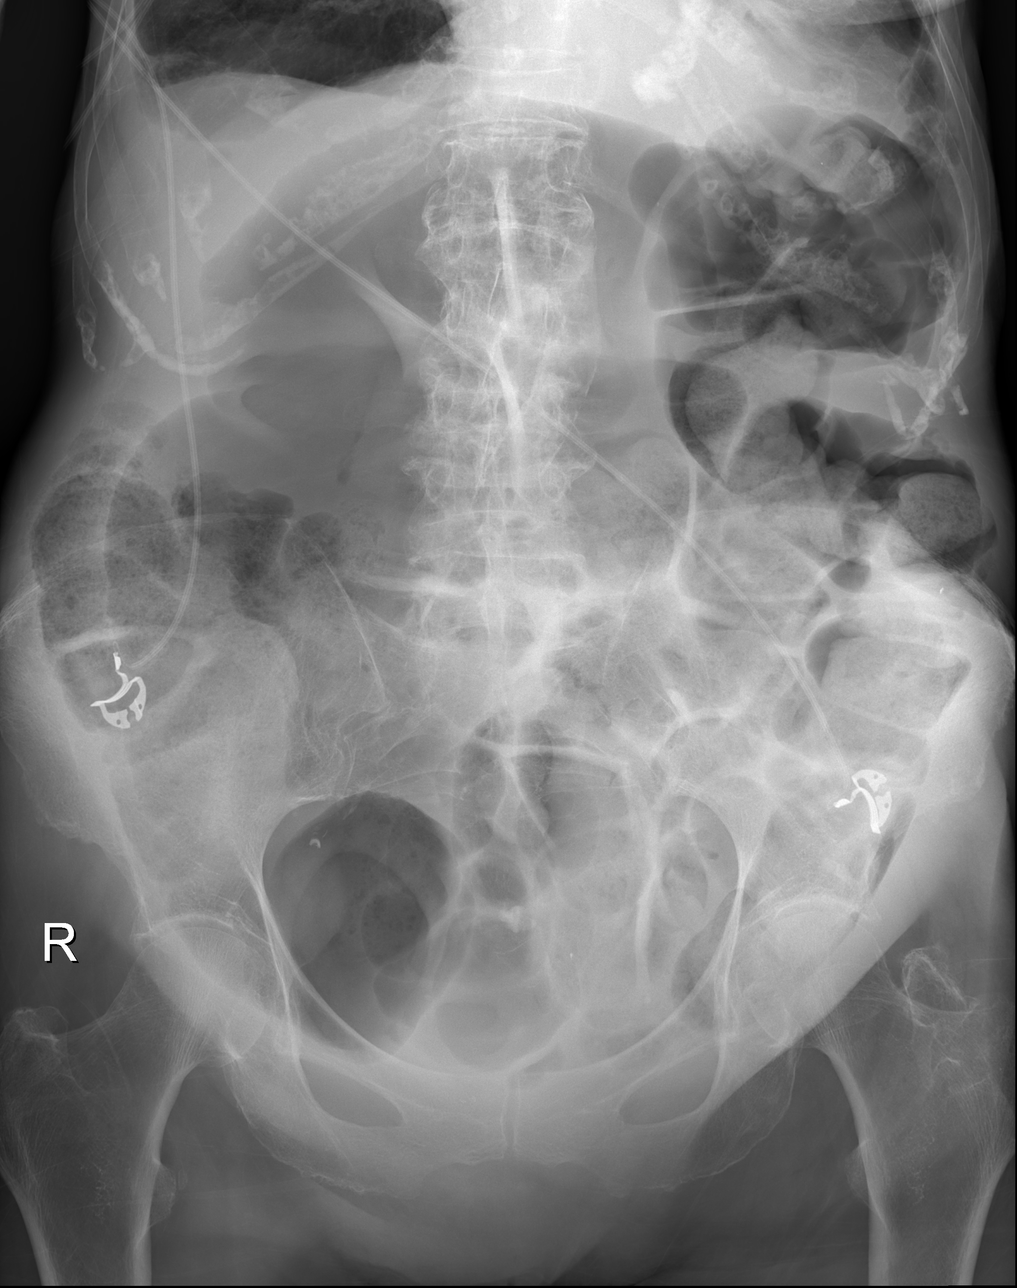

[w abdomen decub]
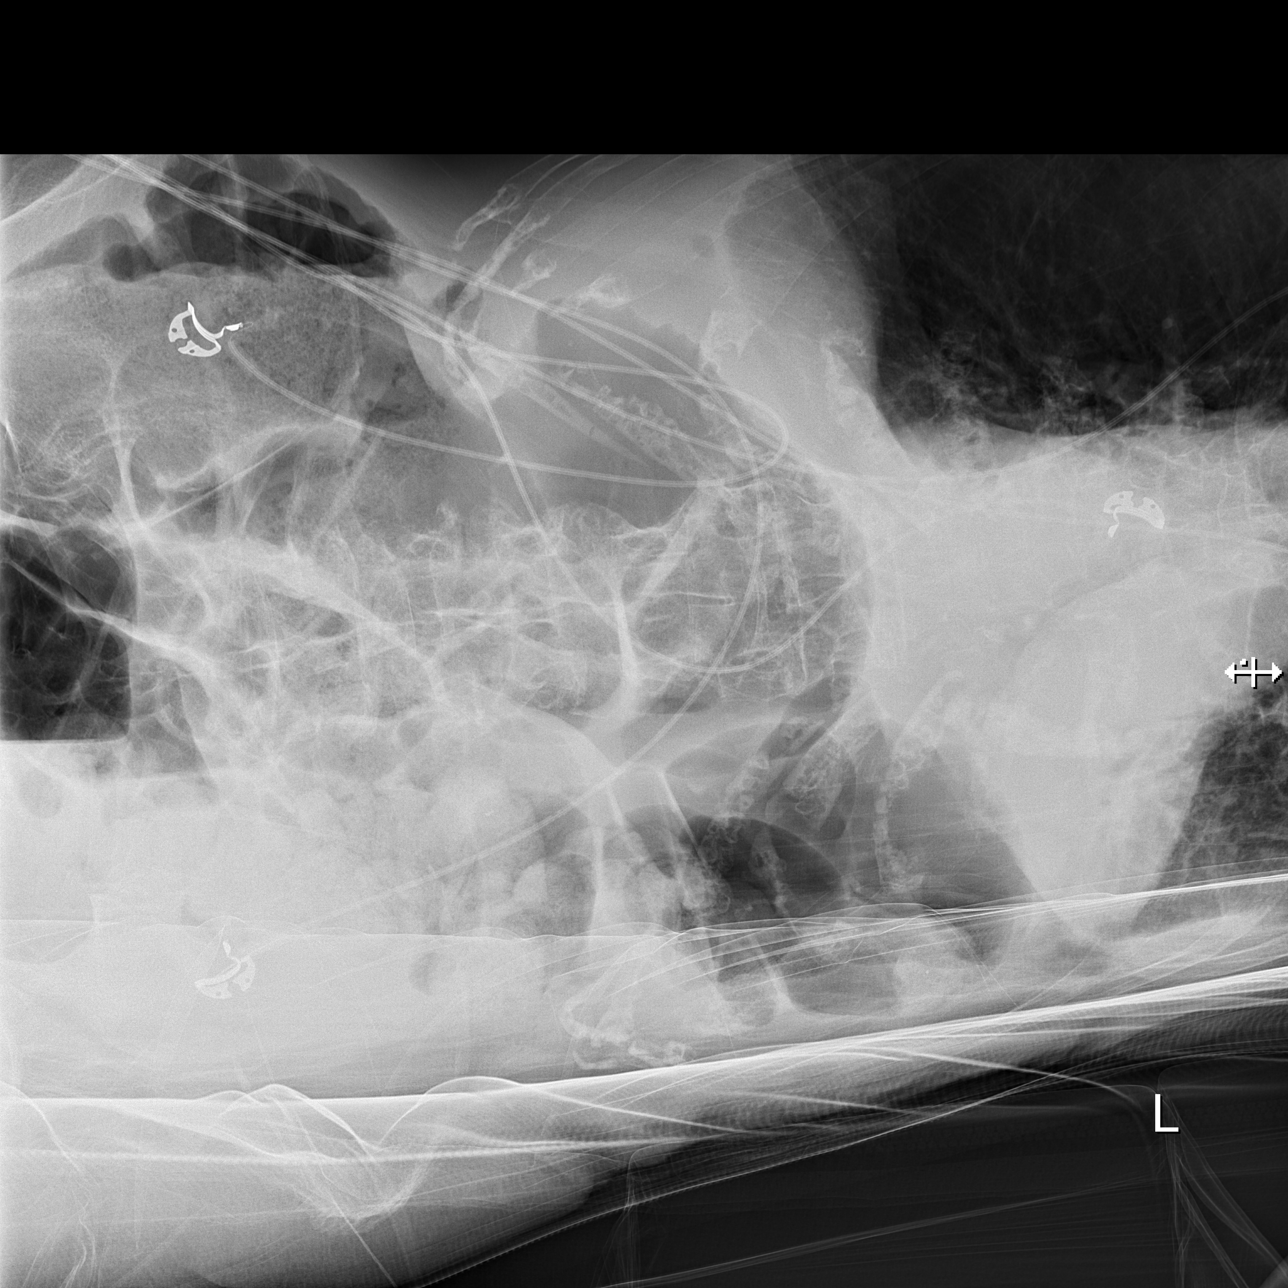

[3 of 3 positions shown; findings below may reference images not displayed]

FINDINGS: Marked gaseous distension of what I believe is the sigmoid colon,
based on the anatomy seen on the prior CT, extending upward well
above the umbilicus. No evidence of colonic distention proximal to
this loop. Very large stool burden throughout the colon. No evidence
of free intraperitoneal air on the lateral decubitus image.
Air-fluid levels in the colon. No small bowel distention. Aortoiliac
atherosclerosis without aneurysm.

Interval development of patchy airspace opacities in the right lung
base and dense consolidation in the left lower lobe since the chest
examination 3 days ago. Small to moderate size right pleural
effusion. Linear atelectasis in the lingula. Cardiac silhouette
mildly to moderately enlarged, unchanged. Pulmonary vascularity
normal without evidence of pulmonary edema.
IMPRESSION: 1. Marked gaseous distention of what I believe is the sigmoid colon,
based on the anatomy identified on a prior CT from 6300. As there is
no colonic distention proximal to this, sigmoid volvulus is unlikely
and severe ileus is favored.
2. Very large colonic stool burden.
3. No evidence of small bowel distention to suggest obstruction.
4. Interval development of patchy pneumonia involving the right lung
base, dense left lower lobe atelectasis and/or pneumonia, and a
small to moderate sized right pleural effusion since the chest x-ray
3 days ago.

## 2016-08-22 DEATH — deceased

## 2017-11-11 IMAGING — DX DG CHEST 2V
2 series · 2 of 2 positions shown · non-contrast
Comparison: Chest radiograph 04/01/2015

CLINICAL DATA: Shortness of breath and hypertension

EXAM:
CHEST  2 VIEW

[x chest ap]
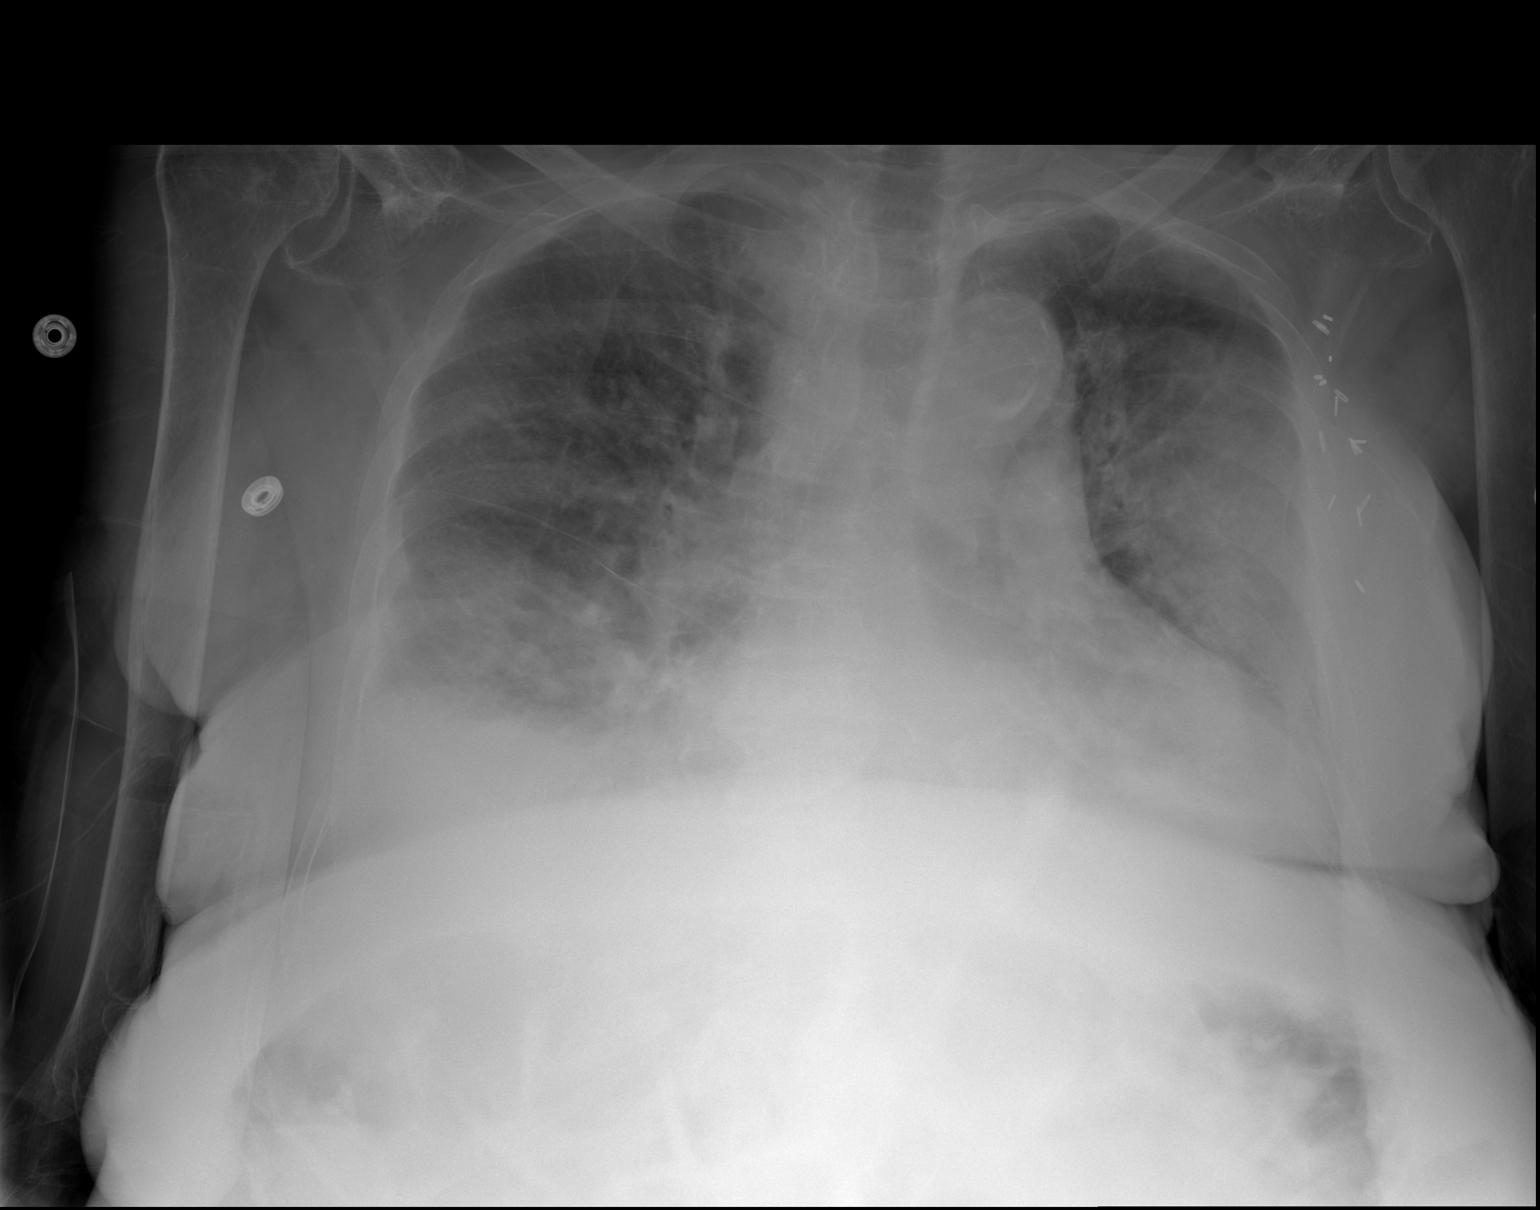

[w chest lat]
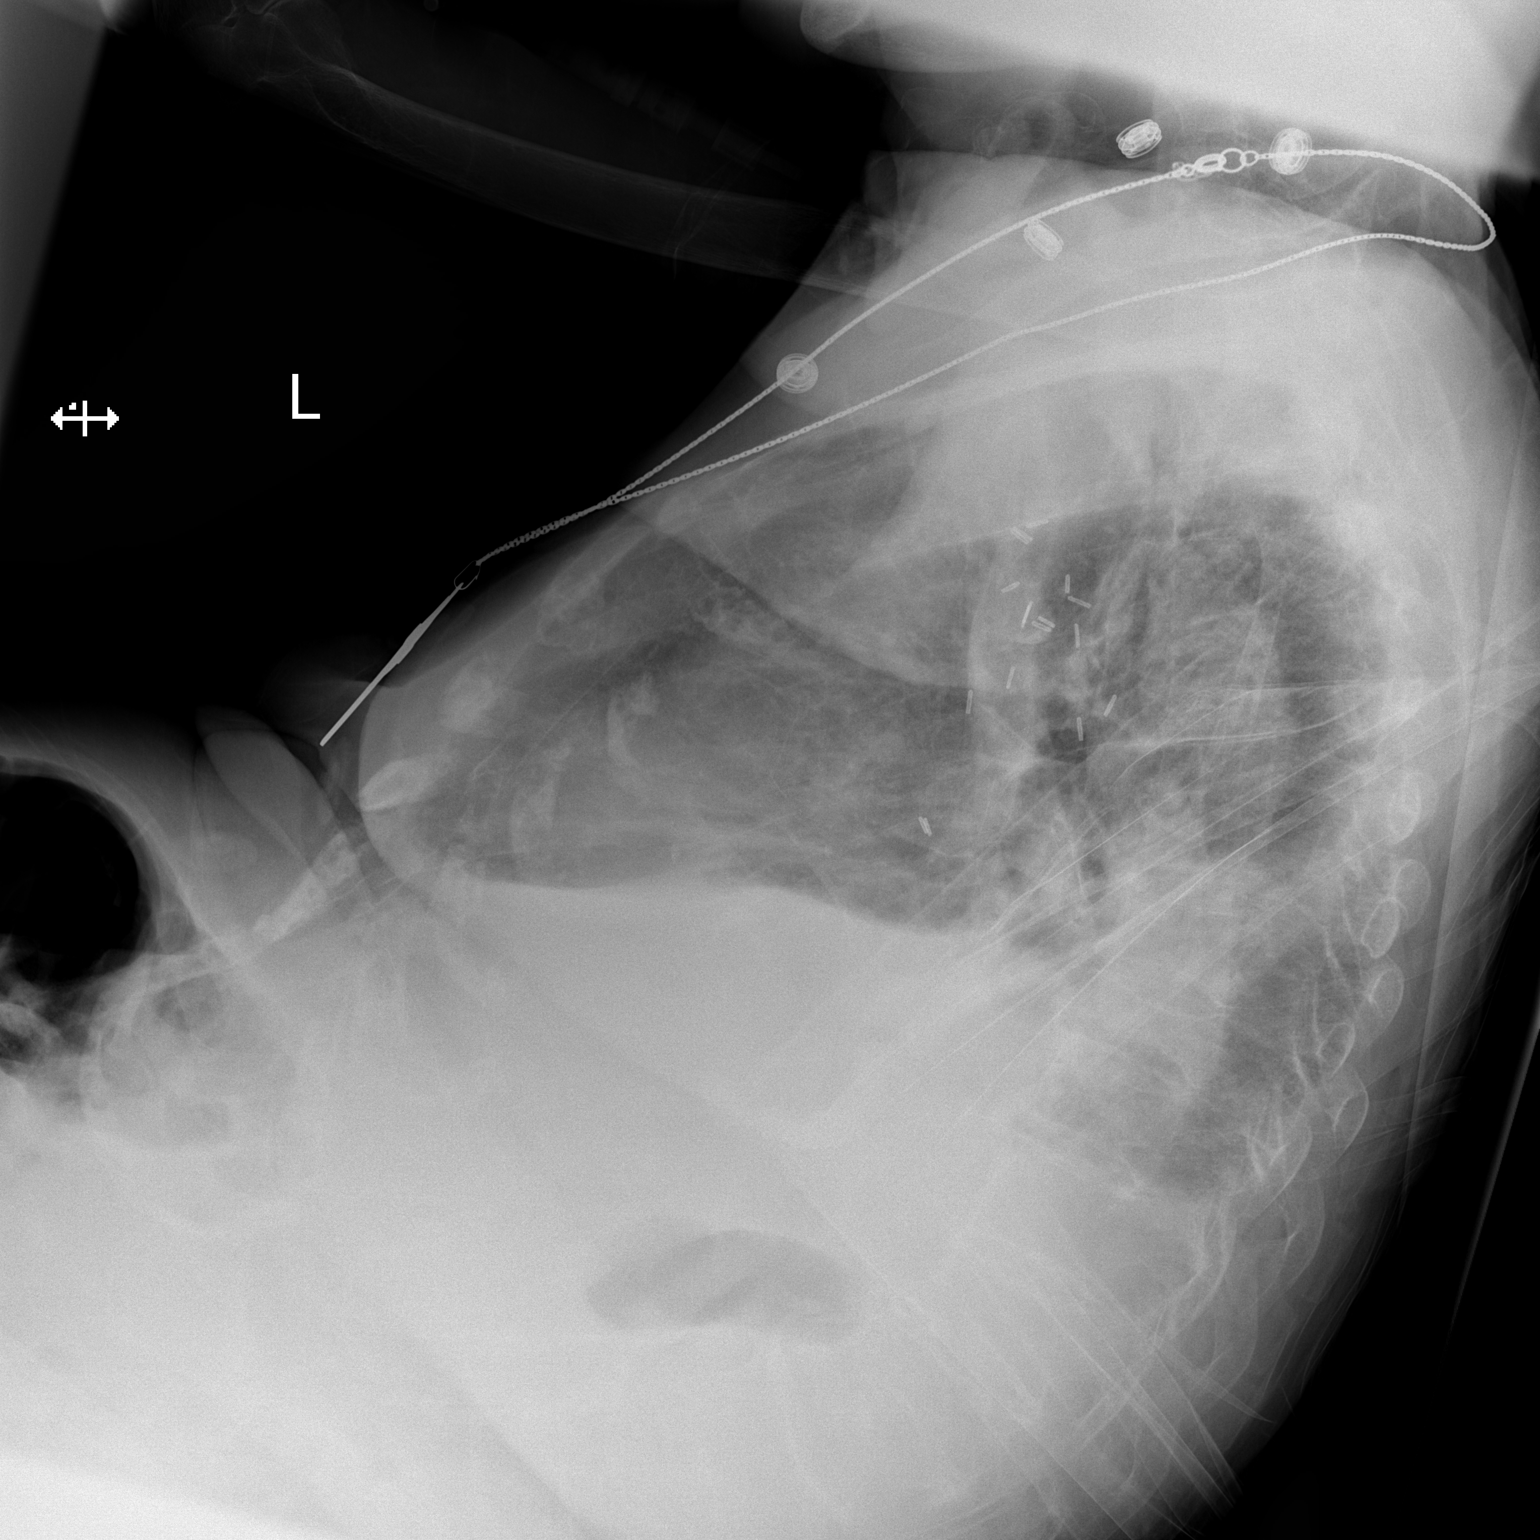

[2 of 2 positions shown; findings below may reference images not displayed]

FINDINGS: There is shallow lung inflation. There is atherosclerotic
calcification within the thoracic aorta. There are bilateral small
pleural effusions, right greater than left. There is central
pulmonary vascular congestion and streaky right basilar opacities.
No pneumothorax. Left axillary clips are noted. Lower thoracic
compression fracture is again seen.
IMPRESSION: 1. Small chronic bilateral pleural effusions, right greater left.
2. Shallow lung inflation with possible mild pulmonary edema.
3. Bibasilar atelectasis, right greater than left.
4. Aortic atherosclerosis.
